# Patient Record
Sex: Female | Born: 2000 | Race: White | Hispanic: No | Marital: Single | Smoking: Never smoker
Health system: Southern US, Community
[De-identification: ages and names within clinical notes are randomized; demographics above are authoritative.]

## PROBLEM LIST (undated history)

## (undated) DIAGNOSIS — F984 Stereotyped movement disorders: Secondary | ICD-10-CM

## (undated) DIAGNOSIS — F840 Autistic disorder: Secondary | ICD-10-CM

## (undated) DIAGNOSIS — R112 Nausea with vomiting, unspecified: Secondary | ICD-10-CM

## (undated) DIAGNOSIS — R4701 Aphasia: Secondary | ICD-10-CM

## (undated) DIAGNOSIS — Z9889 Other specified postprocedural states: Secondary | ICD-10-CM

## (undated) DIAGNOSIS — D649 Anemia, unspecified: Secondary | ICD-10-CM

## (undated) DIAGNOSIS — N946 Dysmenorrhea, unspecified: Secondary | ICD-10-CM

## (undated) DIAGNOSIS — N920 Excessive and frequent menstruation with regular cycle: Secondary | ICD-10-CM

## (undated) DIAGNOSIS — F419 Anxiety disorder, unspecified: Secondary | ICD-10-CM

## (undated) HISTORY — DX: Dysmenorrhea, unspecified: N94.6

## (undated) HISTORY — DX: Anxiety disorder, unspecified: F41.9

## (undated) HISTORY — DX: Anemia, unspecified: D64.9

## (undated) HISTORY — PX: DENTAL RESTORATION/EXTRACTION WITH X-RAY: SHX5796

---
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
## 2018-03-04 ENCOUNTER — Ambulatory Visit (INDEPENDENT_AMBULATORY_CARE_PROVIDER_SITE_OTHER): Payer: Medicaid Other | Admitting: Pediatrics

## 2018-03-04 ENCOUNTER — Encounter (INDEPENDENT_AMBULATORY_CARE_PROVIDER_SITE_OTHER): Payer: Self-pay | Admitting: Pediatrics

## 2018-03-04 VITALS — BP 116/68 | HR 54 | Ht 66.0 in | Wt 161.2 lb

## 2018-03-04 DIAGNOSIS — F89 Unspecified disorder of psychological development: Secondary | ICD-10-CM | POA: Diagnosis not present

## 2018-03-04 DIAGNOSIS — F984 Stereotyped movement disorders: Secondary | ICD-10-CM | POA: Insufficient documentation

## 2018-03-04 DIAGNOSIS — F840 Autistic disorder: Secondary | ICD-10-CM | POA: Diagnosis not present

## 2018-03-04 DIAGNOSIS — G2569 Other tics of organic origin: Secondary | ICD-10-CM | POA: Insufficient documentation

## 2018-03-04 DIAGNOSIS — L981 Factitial dermatitis: Secondary | ICD-10-CM

## 2018-03-04 DIAGNOSIS — F424 Excoriation (skin-picking) disorder: Secondary | ICD-10-CM | POA: Insufficient documentation

## 2018-03-04 MED ORDER — CLONIDINE 0.1 MG/24HR TD PTWK: 0.1000 mg | MEDICATED_PATCH | TRANSDERMAL | 3 refills | Status: DC

## 2018-03-04 NOTE — Patient Instructions (Signed)
Start Clonidine 0.1mg  patch, change every week.  This is for tics and stereotypies.   Recommend trying SSRI again.  I wonder if Paxil would be a good medication given it's effectiveness on anxiety.  This is however Dr Gloris [place] specialty.   When she gets labs, would recommend ferritin level  Consider treating restless leg syndrome at next appointment.    Tic Disorders A tic disorder is a condition in which a person makes sudden and repeated movements or sounds (tics). There are three types of tic disorders:  Transient or provisional tic disorder (common). This type usually goes away within a year or two.  Chronic or persistent tic disorder. This type may last all through childhood and continue into the adult years.  Tourette syndrome (rare). This type lasts through all of life. It often occurs with other disorders. Tic disorders starts before age 78, usually between age of 43 and 51. These disorders cannot be cured, but there are many treatments that can help manage tics. Most tic disorders get better over time. What are the causes? The cause of this condition is not known. What are the signs or symptoms? The main symptom of this condition is experiencing tics. There are four type of tics:  Simple motor tics. These are movements in one area of the body.  Complex motor tics. These are movements in large areas or in several areas of the body.  Simple vocal tics. These are single sounds.  Complex vocal tics. These are sounds that include several words or phrases. Tics range in severity and may be more severe when you are stressed or tired. Tics can change over time. Symptoms of simple motor tics  Blinking, squinting, or eyebrow raising.  Nose wrinkling.  Mouth twitching, grimacing, or making tongue movements.  Head nodding or twisting.  Shoulder shrugging.  Arm jerking.  Foot shaking. Symptoms of complex motor tics  Grooming behavior, such as combing one's  hair.  Smelling objects.  Jumping.  Imitating others' behavior.  Making rude or obscene gestures. Symptoms of simple vocal tics  Coughing.  Humming.  Throat clearing.  Grunting.  Yawning.  Sniffing.  Barking.  Snorting. Symptoms of complex vocal tics  Imitating what others say.  Saying words and sentences that may: ? Seem out of context. ? Be rude. How is this diagnosed? This condition is diagnosed based on:  Your symptoms.  Your medical history.  A physical exam.  An exam of your nervous system (neurological exam).  Tests. These may be done to rule out other conditions that cause symptoms like tics. Tests may include: ? Blood tests. ? Brain imaging tests. Your health care provider will ask you about:  The type of tics you have.  When the tics started and how often they happen.  How the tics affect your daily activities.  Other medical issues you may have.  Whether you take over-the-counter or prescription medicines.  Whether you use any drugs. You may be referred to a brain and nerve specialist (neurologist) or a mental health specialist for further evaluation. How is this treated? Treatment for this condition depends on how severe your tics are. If they are mild, you may not need treatment. If they are more severe, you may benefit from treatment. Some treatments include:  Cognitive behavioral therapy. This kind of therapy involves talking to a mental health professional. The therapist can help you to: ? Become more aware of your tics. ? Learn ways to control your tics. ? Know how  to disguise your tics.  Family therapy. This kind of therapy provides education and emotional support for your family members.  Medicine that helps to control tics.  Medicine that is injected into the body to relax muscles (botulinum toxin). This may be a treatment option if your tics are severe.  Electrical stimulation of the brain (deep brain stimulation). This  may be a treatment option if your tics are severe. Follow these instructions at home:  Take over-the-counter and prescription medicines only as told by your health care provider.  Check with your health care provider before using any new prescription or over-the-counter medicines.  Keep all follow-up visits as told by your health care provider. This is important. Contact a health care provider if:  You are not able to take your medicines as prescribed.  Your symptoms get worse.  Your symptoms are interfering with your ability to function normally at home, work, or school.  You have new or unusual symptoms like pain or weakness.  Your symptoms make you feel depressed or anxious. Summary  A tic disorder is a condition in which a person makes sudden and repeated movements or sounds.  Tic disorders start before age 19, usually between the age of 395 and 5210.  Many tic disorders are mild and do not need treatment.  These disorders cannot be cured, but there are many treatments that can help manage tics. This information is not intended to replace advice given to you by your health care provider. Make sure you discuss any questions you have with your health care provider. Document Released: 11/04/2012 Document Revised: 03/22/2016 Document Reviewed: 03/22/2016 Elsevier Interactive Patient Education  2019 [employer]

## 2018-03-04 NOTE — Progress Notes (Signed)
Patient: Gabrielle [name] MRN: [id number] Sex: female DOB: 08/08/2000  Provider: Lorenz CoasterStephanie Deniyah Dillavou, MD Location of Care: [facility] - Child Neurology  Note type: New patient consultation  History of Present Illness: Referral Source: Gabrielle AstersJennifer Summer, MD History from: patient and prior records Chief Complaint: Facial Tics, Autism, Unsteadiness  Gabrielle [name] is a 17 y.o. female with history of autism spectrum disorder who I am seeing by the request of * for consultation on concern of abnormal movements. Review of prior history shows patient was last seen by his PCP on  where   Patient presents today with abnormal facial movements, picking behaviors, and abnormal walking.   Gabrielle [name] has autism and is non-verbal. April of 2019 started making new facial expressions. Her mouth opens very wide, and her eyes also open very wide, mother describes as 'ghoulish'. Typically lasts a few seconds at a time, and happens multiple times every day. Psychiatrist did not feel was consistent with TD, PCP thought was 'nervous tic'.  Mother is concerned for possible seizure activity versus tardive dyskinesia. Of note, facial expression is the same with each episode. It is also associated with a 'glottic sound', somewhat like clearing her throat, at same time she is making the facial expression.  In addition to this, starting around the same time (06/2017), Gabrielle [name] also developed what appears to mother to be fear with walking in certain locations, initially just the school cafeteria, but now also the school staircase and some hallways at school. She will hesitate at the entry way, and then either clutch her caretaker or the wall as she enters these locations. This behavior never happens at home. She does not appear unsteady on her feet. It is intermittent at school, as many days she enters these locations with no problem. Of note, Gabrielle [name] is very scared of ice and mother is wondering if the shiny floors at school  may be part of the problem.  In addition, mother describes 'repetitive body focused behaviors' which eventually cause self injury, which is also intermittent. She picks and scratches at her skin, locations rotate. Currently lower abdomen and behind her knees are worst. Mother reports she does not sense pain much, and wonders if the behaviors are self-stimulatory. It is hard to distracts her from it when she is doing it, but it may also go away for a month. She is mostly concerned about infection risk from her open wounds.  Additionally, mother concerned about Gabrielle [name]'s appetite. She reports she is 'never satiated' and very much enjoys eating (which is also puzzling to her as to why she is having trouble entering the cafeteria). Gabrielle [name] is extremely active, always 'up and bouncy', and mother is wondering if her hyperactive state may be contributing to her large appetite.  She has occasional frustration and aggression, typically related to her menstrual cycle, and has previously had D&C performed and now has an IUD in place. She had first menstrual period in 2012.  Regarding her medications, she currently takes Trazodone 50 mg and Lamotrigine 50 mg BID, and also has IUD in place. Her psychiatrist recommend decreasing lamotrigine to half of her current dose, but mother has not yet done this. She has been on both oral medications for long-term, and each has been gradually up-titrated.  Previous medications include Requip (ropinirole) for RLS, which improved after increasing ferritin levels and decreasing her menses. Previously, she has also tried Celexa, Zoloft, Lexapro, clonidine, Focalin, Buspar, hydroxyline., Ativan (short and long-acting).  Regarding her past work-up, she  has had genetic testing with unclear genetic mutation present (mother with paperwork in her file). They have previously seen Dr. Sharene Skeans to rule out seizure activity at age 32. She had normal EEG at that time. She has not had known seizures to  this point. She previously spoke, had 50 words and short phrases at 15 months, but then started to lose language by age 69 months, and is now non-verbal. She was diagnosed with ASD at age 51 months. She lost receptive language as well, but upon entering therapies did regain this by age 72.  She attends school at [employer] which is going well.   Diagnostics:   Review of Systems: A complete review of systems was remarkable for anxiety (face picking), occasional bed wetting, tics, all other systems reviewed and negative.  Past Medical History Past Medical History:  Diagnosis Date  . Anemia   . Anxiety   . Autism disorder    per mother severe autism, very social,  good receptive languange but nonverbal (can do some sign languange) , high tolerence to pain  . Dysmenorrhea   . Menorrhagia   . Nonverbal    PT AUTISTIC  . PONV (postoperative nausea and vomiting)    per mother "when wakes up pt can go from 0 to 60 and starts taking off cords and try to sit up to stand, better when mother and father are they when she wakes up"  . Repetitive movement    due to autism---  chronic picking of own skin and body    Surgical History Past Surgical History:  Procedure Laterality Date  . DENTAL RESTORATION/EXTRACTION WITH X-RAY     multiple times.  most recent 09/04/18  . DILATION AND CURETTAGE OF UTERUS N/A 04/07/2017   Procedure: DILATATION AND CURETTAGE;  Surgeon: Jerene Bears, MD;  Location: [facility];  Service: Gynecology;  Laterality: N/A;  . INTRAUTERINE DEVICE (IUD) INSERTION N/A 04/07/2017   Procedure: PLACEMENT OF Rutha Bouchard  IUD;  Surgeon: Jerene Bears, MD;  Location: [facility];  Service: Gynecology;  Laterality: N/A;  . MASS EXCISION Left 06/15/2015   Procedure: EXCISION OF LEFT AXILLARY MASS/TETANUS INJECTION;  Surgeon: Peggye Form, DO;  Location: [facility];  Service: Plastics;  Laterality: Left;    Family History family history  includes Arthritis in her paternal grandmother; Asthma in her mother; Cancer in her maternal grandfather; Diabetes in her maternal grandfather; Diverticulosis in her father; Endometriosis in her maternal grandmother; Heart disease in her maternal grandmother; Hyperlipidemia in her father and mother; Hypertension in her father and paternal grandfather.   Social History Social History   Social History Narrative   Patient lives with both parents. Jennessy attends [employer].       Therapies: Not at the moment; Speech consultation at school.    Allergies Allergies  Allergen Reactions  . Cleaner [Ao-Sept Disinfection-Neutral] Hives    BRAND NAME - KABOOM    Medications Current Outpatient Medications on File Prior to Visit  Medication Sig Dispense Refill  . ibuprofen (ADVIL,MOTRIN) 200 MG tablet Take 200 mg by mouth every 6 (six) hours as needed.    . lamoTRIgine (LAMICTAL) 100 MG tablet Take 50 mg by mouth 2 (two) times daily.    . traZODone (DESYREL) 50 MG tablet Take 50 mg by mouth at bedtime.     Marland Kitchen KYLEENA 19.5 MG IUD 1 Device by Intrauterine route once for 1 dose. 1 Intra Uterine Device 0  No current facility-administered medications on file prior to visit.    The medication list was reviewed and reconciled. All changes or newly prescribed medications were explained.  A complete medication list was provided to the patient/caregiver.  Physical Exam BP 116/68   Pulse (!) 54   Ht 5\' 6"  (1.676 m)   Wt 161 lb 3.2 oz (73.1 kg)   BMI 26.02 kg/m  88 %ile (Z= 1.19) based on CDC (Girls, 2-20 Years) weight-for-age data using vitals from 03/04/2018.  No exam data present  General: Awake, alert, nearly constantly pacing around the room, non-verbal. Multiple repetitive behaviors visualized, including facial expression of mother's concern (mouth and eyes opening wide). HEENT: PERRL, no conjunctivitis or rhinorrhea, MMM CV: RRR, no murmurs Resp: CTAB, normal WOB Abd: Soft,  non-tender, non-distended Neuro: Alert, non-verbal. Tracks objects with eyes well, vision and hearing grossly intact. Uses both hands equally. Gait steady and normal. Normal muscle bulk and tone. Skin: Multiple excoriations and cuts on lower abdomen (consistent with picking/scratching behavior). Scars present on chin from prior behavior. MSK: No edema or erythema  Screenings:   Diagnosis:  Problem List Items Addressed This Visit      Nervous and Auditory   Organic tic disorder     Other   Developmental disability   Autism - Primary   Stereotyped movement disorder   Compulsive skin picking      Assessment and Plan IRMGARD RAMPERSAUD is a 17 y.o. female with history of ASD who presents for evaluation of facial movements, fearful walking behavior, and picking at her skin.  Regarding her facial movements, they do not appear consistent with tardive dyskinesia, and although she has been on multiple medications, no anti-psychotics or medications considered high risk for TD. It is more likely that they represent a tic disorder, or given the frequency, even a stereotopy. In general, it is not necessary to treat a tic disorder or stereotopy, unless it is affecting quality of life. It is unclear if she is bothered by her behaviors and activity, although it is certainly possible. Her wounds from her picking behavior are at risk for infection. She also has an insatiable appetite which may be due in part to her constant and excessive movements. Therefore, after conversation with mother, decided to initiate treatment today. First line therapy includes alpha agonists (clonidine, guanfacine). Recommend starting clonidine patch, as will provide even level of medication and be relatively easy to administer and tolerable for [place]. Of note, Ryeleigh's psychiatrist would also like to reduce her lamotrigine dose, but mother would prefer to wait to do until after starting clonidine given her desire to make only a single  change at a time.  In addition, we would also consider adding an SSRI, in particular Paxil (paroxetine), to target anxiety for picking behaviors and other repetitive tic movements. Suspect fearful walking behavior may also improve after this treatment. However, would defer this treatment decision to her psychiatrist, and have discussed this with her mother.  Regarding lab work, would recommended ferritin level at next lab draw. She has had improvement with her Linna Hoff previously after treatment with iron and control of her menstrual period, and so therefore likely that RLS contributes to her excessive movements. RLS may also contribute to daytime restlessness, and if this was identified as part of Maridel's near-constant motion, would have additional therapeutic options potentially available.  Return in about 2 months (around 05/05/2018).  Gabrielle Coaster MD MPH Neurology and Neurodevelopment [facility] Child Neurology  [address]  [address], [place], [place] [address] Phone: [phone]

## 2018-03-20 ENCOUNTER — Telehealth (INDEPENDENT_AMBULATORY_CARE_PROVIDER_SITE_OTHER): Payer: Self-pay | Admitting: Pediatrics

## 2018-03-20 NOTE — Telephone Encounter (Signed)
Returned mother's phone call.  She has been having trouble keeping clonidine patch on.  Has also been more agitated, crying throughout the day.  Went to bed early, woke up early this morning and hasn't gone back to bed.  Mom has been out of the house today. Reviewed that clonidine seemed to make her more agitated at end of the pill. Also chews her pills. No changed from Dr Jannifer Franklin. Due to see him 03/24/2018.   Mother also now has a list of everything she's ever taken.  I asked her to please fax this to me.    I advised that emotionality can be a side effect, would encourage her to continue on the medication. Discussed that she can't do extended release, short acting would be three times daily.  Mother agreeable to continuing to try. Also try putting new patch on overnight.    Lorenz Coaster MD MPH

## 2018-03-20 NOTE — Telephone Encounter (Signed)
°  Who's calling (name and relationship to patient) : Melven Sartorius - Mother  Best contact number: [phone]  Provider they see: Dr. Artis Flock  Reason for call: Mom called in with some questions and concerns regarding the Clonidine path Dr. Artis Flock has prescribed. The patch was first applied on 03/18/18, since then Gabrielle [name] has been very emotionally distressed such as more crying, picking, and more agitated than normal. Mom would like to speak with Dr. Artis Flock or medical assistant to decide if they should just remove the patch or wait it out for the symptoms to subside. Please advise.

## 2018-03-20 NOTE — Telephone Encounter (Signed)
Spoke with mom about her phone message. She stated that when she first got the prescription,she put the patch on her and the patient had it off within 6 hours. She stated that they took a break after the last patch failure. She stated that they did the second patch on the first in the middle of the night but the patient became agitated throughout the day. I informed her that it usually takes one to two weeks for the medication to get in the patient's system. She stated that it is difficult to put the patches on and once the patches come off, the patient will not let mom put it back on. She wants Dr. Artis Flock to be aware that the patient is really difficult when it comes to the patch but she is willing to keep trying. Pease advise

## 2018-05-04 ENCOUNTER — Encounter (INDEPENDENT_AMBULATORY_CARE_PROVIDER_SITE_OTHER): Payer: Self-pay | Admitting: Pediatrics

## 2018-05-04 ENCOUNTER — Ambulatory Visit (INDEPENDENT_AMBULATORY_CARE_PROVIDER_SITE_OTHER): Payer: Medicaid Other | Admitting: Pediatrics

## 2018-05-04 VITALS — BP 102/68 | Ht 65.75 in | Wt 163.0 lb

## 2018-05-04 DIAGNOSIS — L981 Factitial dermatitis: Secondary | ICD-10-CM

## 2018-05-04 DIAGNOSIS — F840 Autistic disorder: Secondary | ICD-10-CM

## 2018-05-04 DIAGNOSIS — G2569 Other tics of organic origin: Secondary | ICD-10-CM | POA: Diagnosis not present

## 2018-05-04 DIAGNOSIS — F984 Stereotyped movement disorders: Secondary | ICD-10-CM | POA: Diagnosis not present

## 2018-05-04 DIAGNOSIS — F424 Excoriation (skin-picking) disorder: Secondary | ICD-10-CM

## 2018-05-04 DIAGNOSIS — F89 Unspecified disorder of psychological development: Secondary | ICD-10-CM

## 2018-05-04 DIAGNOSIS — Z79899 Other long term (current) drug therapy: Secondary | ICD-10-CM

## 2018-05-04 MED ORDER — CLONIDINE HCL 0.1 MG PO TABS: 0.0500 mg | ORAL_TABLET | Freq: Three times a day (TID) | ORAL | 0 refills | Status: DC

## 2018-05-04 NOTE — Progress Notes (Signed)
Patient: Gabrielle [name] MRN: [id number] Sex: female DOB: 03/05/2001  Provider: Lorenz CoasterStephanie Shanterica Biehler, MD Location of Care: [facility]  Note type: Routine follow-up  History of Present Illness: Gabrielle [name] is a 18 y.o. female with history of autism who I am seeing for follow-up of tic disorder and akathesia.   Patient was last seen on 03/04/18 where I started clonidine patch and recommended ferritin level for RLS.  Since the last appointment, there was a phone call due to patch coming off.  Mother also had list of her prior medications.  There have been no labs or admissions since last visit.   Patient presents today with mother and nurse. Mother reports she got clonidine off, mother had hard time keeping it on her.  No negative effects while she was on it.    They have gone down to half strength on lamictal.  Have not seen mouth movement recently. She is still very hyperactive, jumps frequently, seems to not want to stay still.  Sleep is good with trazodone.     Mother provided previous medical evaluations today including list of all previous medications. She took requip at age 89yo with pediatric neuropsych, they feel it made her nauseous.  She did sleep well on it.  She was not as active at that time.  Celexa was also started at that time, mother feels it was too high.  She had a flu virus, was sleeping better.  Pediatrician weaned her off and she was able to sleep well.  She was also started on Focalin, "climbing the walls", very amped up.  Couldn't take slow release. She is able to swallow some pills, sometimes chews it.   Regarding iron level, last ferritin level in 2008 was 46, mother unsure if she's had one since. She did get IUD 1 year ago. Still having periods, normal to light periods.  She likes red meat, doesn't like leafy vegetables. No upcoming sedation planed.  HgB normal in May, scheduled for appointment next May.    Gen[facility]), genetic testing showed normal karyotype, 8p11 duplication.  Did not test parents.  Food and chemical sensitivity testing showed severe intolerance to honey and scallops. Mild reaction to aspartame and artificial colors. Otherwise no major concerns.  Toxicology normal.  Organic acids, amino acids testing reviewed and copies, these are not standard panels but none appear extreme.  Mother not particularly interested in further genetics testing.  Mother is interested in pharmacogenetic testing however given her multiple medications and discussion of several other medications.     Diagnostics: Prior medications:  Neurontin, oxcarbazepine, Lamotrigine, Celexa, Lexapro. Fluvoxamine, buspirone, focalin, zoloft, hydroxizine.  Diazepam, Clonazepam, Midazolam, Lorazepam, Requip.   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  Did not test parents.   Food and chemical sensitivity testing showed severe intolerance to honey and scallops. Mild reaction to aspartame and artificial colors. Otherwise no major concerns.   Toxicology normal.   Organic acids, amino acids testing reviewed and copies, these are not standard panels but none appear extreme.  Mother not particularly interested in further genetics testing.   Past Medical History Past Medical History:  Diagnosis Date  . Anemia   . Anxiety   . Autism disorder    per mother severe autism, very social,  good receptive languange but nonverbal (can do some sign languange) , high tolerence to pain  . Dysmenorrhea   . Menorrhagia   . Nonverbal  PT AUTISTIC  . PONV (postoperative nausea and vomiting)    per mother "when wakes up pt can go from 0 to 60 and starts taking off cords and try to sit up to stand, better when mother and father are they when she wakes up"  . Repetitive movement    due to autism---  chronic picking of own skin and body    Surgical History Past Surgical History:  Procedure Laterality Date  .  DENTAL RESTORATION/EXTRACTION WITH X-RAY     multiple times.  most recent 09/04/18  . DILATION AND CURETTAGE OF UTERUS N/A 04/07/2017   Procedure: DILATATION AND CURETTAGE;  Surgeon: Jerene Bears, MD;  Location: [facility]  Service: Gynecology;  Laterality: N/A;  . INTRAUTERINE DEVICE (IUD) INSERTION N/A 04/07/2017   Procedure: PLACEMENT OF Rutha Bouchard  IUD;  Surgeon: Jerene Bears, MD;  Location: [facility];  Service: Gynecology;  Laterality: N/A;  . MASS EXCISION Left 06/15/2015   Procedure: EXCISION OF LEFT AXILLARY MASS/TETANUS INJECTION;  Surgeon: Peggye Form, DO;  Location: [facility];  Service: Plastics;  Laterality: Left;    Family History family history includes Arthritis in her paternal grandmother; Asthma in her mother; Cancer in her maternal grandfather; Diabetes in her maternal grandfather; Diverticulosis in her father; Endometriosis in her maternal grandmother; Heart disease in her maternal grandmother; Hyperlipidemia in her father and mother; Hypertension in her father and paternal grandfather.   Social History Social History   Social History Narrative   Patient lives with both parents. Marlenne attends [employer].       Therapies: Not at the moment; Speech consultation at school.    Allergies Allergies  Allergen Reactions  . Cleaner [Ao-Sept Disinfection-Neutral] Hives    BRAND NAME - KABOOM    Medications Current Outpatient Medications on File Prior to Visit  Medication Sig Dispense Refill  . ibuprofen (ADVIL,MOTRIN) 200 MG tablet Take 200 mg by mouth every 6 (six) hours as needed.    . lamoTRIgine (LAMICTAL) 100 MG tablet Take 25 mg by mouth 2 (two) times daily.     . traZODone (DESYREL) 50 MG tablet Take 50 mg by mouth at bedtime.     Marland Kitchen KYLEENA 19.5 MG IUD 1 Device by Intrauterine route once for 1 dose. 1 Intra Uterine Device 0   No current facility-administered medications on file prior to  visit.    The medication list was reviewed and reconciled. All changes or newly prescribed medications were explained.  A complete medication list was provided to the patient/caregiver.  Physical Exam BP 102/68   Ht 5' 5.75" (1.67 m)   Wt 163 lb (73.9 kg)   BMI 26.51 kg/m  89 %ile (Z= 1.22) based on CDC (Girls, 2-20 Years) weight-for-age data using vitals from 05/04/2018.  No exam data present General: NAD, well nourished neuroaffected female.  Makes eye contact, interactive but nonverbal.  Yelling and juping throughout evaluation.  HEENT: normocephalic, no eye or nose discharge.  MMM  Cardiovascular: warm and well perfused Lungs: Normal work of breathing, no rhonchi or stridor Skin: No birthmarks. Scratch marks of various ages on cuticles, trunk.   Abdomen: soft, non tender, non distended Extremities: No contractures or edema. Neuro: EOM intact, face symmetric. Moves all extremities equally and at least antigravity. No abnormal movements. Normal gait.  Jumps on both feet.   Diagnosis:  Problem List Items Addressed This Visit      Nervous and Auditory   Organic  tic disorder - Primary     Other   Developmental disability   Autism   Stereotyped movement disorder   Compulsive skin picking   Polypharmacy   Relevant Orders   [facility] genetic testing (Lineagen)      Assessment and Plan Gabrielle [name] is a 18 y.o. female with history of autism who I am seeing in follow-up of complex tic and akathesia.  We attempted clonidine given the tics and hyperactivity, but unable to keep patch on.  Patient unable to swallow pills consistently, so unable to take extended release.  Discussed instead doing TID dosing, mother in agreement.  For presumed RLS, shared decision making about sedation for ferritin testing vs prescription (requip) vs presumptive iron supplementation.  Will try iron supplement and see how she does for now.  Ok to start with clonidine as stores will need to build up over the next  3 months.  Mother concerned due to frequent medication changes, would like pharmacogenetic testing.  Given her extensive medication list and nonverbal status to report side effects, I agree with this plan.     Paperwork reviewed, copied and scanned into chart  Start Clonidine 0.05mg  TID.  Mother to call within 3-4 days to increase.   Medication administration form completed.   Lineagen buccal swab sent today for pharmacogenetic testing.  Advised of expected price but asked mother to please call company directly before processing. Contact information provided.   Start Vitron C, 2 tablets every other day.  Information given for vitamin.   I spent a total of 70 minutes in consultation with the patient and family, including review of prior documentation and discussion of lineagen testing.  Greater than 50% was spent in counseling and coordination of care with the patient.    Return in about 6 weeks (around 06/15/2018).  Lorenz Coaster MD MPH Neurology and Neurodevelopment [facility]  [address] [place], [place], [place] [address] Phone: [phone]

## 2018-05-04 NOTE — Patient Instructions (Addendum)
Lineagen testing completed today- results in 4-6 weeks Start Vitron C, 2 tablets every other day Start Clonidine 1/2 tablet three times daily School medication administration form completed for clonidine Recommend ferritin level at next lab draw.  Last lab draw 2008 low.   I don't mind weaning off Lamicatl- discuss with Dr Jannifer Franklin Consider Requip, paxil as next medications.

## 2018-05-15 ENCOUNTER — Telehealth (INDEPENDENT_AMBULATORY_CARE_PROVIDER_SITE_OTHER): Payer: Self-pay | Admitting: Pediatrics

## 2018-05-15 NOTE — Telephone Encounter (Signed)
I returned calling it went straight to voicemail. I left a message that I was returning the call regarding Kailey and that I would also send in [place] message.  Lorenz Coaster MD MPH

## 2018-05-15 NOTE — Telephone Encounter (Signed)
°  Who's calling (name and relationship to patient) : [name] (mom)  Best contact number: [phone]  Provider they see: Artis Flock   Reason for call: Mom LVM stating she would like for Dr Sheppard Penton to call her, she has updates about patient condition.   Please call.     PRESCRIPTION REFILL ONLY  Name of prescription:  Pharmacy:

## 2018-05-18 ENCOUNTER — Encounter (INDEPENDENT_AMBULATORY_CARE_PROVIDER_SITE_OTHER): Payer: Self-pay | Admitting: Pediatrics

## 2018-05-18 MED ORDER — CLONIDINE HCL 0.1 MG PO TABS: 0.1000 mg | ORAL_TABLET | Freq: Three times a day (TID) | ORAL | 0 refills | Status: DC

## 2018-05-18 NOTE — Telephone Encounter (Signed)
Mychart not active.  Called again this morning and mother answered.  Mother reports no side effects, she is possibly slower.   Recommend increasing Clonidine slowly to 0.1mg  TID. I will send a new medication administration form, will fax it to the school.    She also reports possible episodes vertigo, with holding onto the wall and clinging to staff, seeming unsteady acutely. I recommend meclizine if they think it is an inner ear concern.    Lorenz Coaster MD MPH

## 2018-06-20 ENCOUNTER — Other Ambulatory Visit (INDEPENDENT_AMBULATORY_CARE_PROVIDER_SITE_OTHER): Payer: Self-pay | Admitting: Pediatrics

## 2018-06-24 ENCOUNTER — Other Ambulatory Visit: Payer: Self-pay

## 2018-06-24 ENCOUNTER — Encounter (INDEPENDENT_AMBULATORY_CARE_PROVIDER_SITE_OTHER): Payer: Self-pay | Admitting: Pediatrics

## 2018-06-24 ENCOUNTER — Ambulatory Visit (INDEPENDENT_AMBULATORY_CARE_PROVIDER_SITE_OTHER): Payer: Medicaid Other | Admitting: Pediatrics

## 2018-06-24 DIAGNOSIS — F840 Autistic disorder: Secondary | ICD-10-CM

## 2018-06-24 DIAGNOSIS — F984 Stereotyped movement disorders: Secondary | ICD-10-CM | POA: Diagnosis not present

## 2018-06-24 DIAGNOSIS — G2569 Other tics of organic origin: Secondary | ICD-10-CM | POA: Diagnosis not present

## 2018-06-24 DIAGNOSIS — L981 Factitial dermatitis: Secondary | ICD-10-CM | POA: Diagnosis not present

## 2018-06-24 DIAGNOSIS — F89 Unspecified disorder of psychological development: Secondary | ICD-10-CM | POA: Diagnosis not present

## 2018-06-24 DIAGNOSIS — F424 Excoriation (skin-picking) disorder: Secondary | ICD-10-CM

## 2018-06-24 MED ORDER — CLONIDINE HCL 0.1 MG PO TABS: ORAL_TABLET | ORAL | 3 refills | Status: DC

## 2018-06-24 MED ORDER — TRAZODONE HCL 50 MG PO TABS: 50.0000 mg | ORAL_TABLET | Freq: Every day | ORAL | 3 refills | Status: DC

## 2018-06-24 NOTE — Patient Instructions (Signed)
Continue Lamictal 12.5mg  for another week, then stop Increase Clonidine to 0.1mg  in the morning, 0.15mg  at lunchtime and 0.15mg  at 5pm.  Continue trazodone 50mg  at night Follow-up in 1 month via webex to review medication changes.

## 2018-06-24 NOTE — Progress Notes (Signed)
Patient: Gabrielle [name] MRN: [id number] Sex: female DOB: 03/13/2001  Provider: Lorenz Coaster, MD  This is a Pediatric Specialist E-Visit follow up consult provided via WebEx  Gabrielle [name] and their parent/guardian Gabrielle [name] (name of consenting adult) consented to an E-Visit consult today.  Location of patient: Gabrielle [name] is at Home (location) Location of provider: Shaune Pascal is at [employer] (location) Patient was referred by Ronney Asters, MD   The following participants were involved in this E-Visit:mother, patient, docotr, medical assistant (list of participants and their roles)  Chief Complain/ Reason for E-Visit today: Routine Follow-Up  History of Present Illness:  Gabrielle [name] is a 18 y.o. female with history of autism, picking behaviors and akathesia who I am seeing for routine follow-up. Patient was last seen on 05/04/18 where I recommended clonidine.   Patient presents today with mother via webex. Mother has increased slowly to TID dosing has gotten to goal dose a couple weeks ago.    Mother hasn't noticed any major side effects.  She did take a nap once for 10 minutes, but not lethargic and having any trouble with sleeping overnight and no sleeping in.  She is now falling asleep and staying asleep through the night.  She is sitting down more, not as fidgity.  She is still doing some picking, it was especially bad at the end of the school year.    She continues on Lamictal, mother spoke with Dr Jannifer Franklin who said he would defer to me. Mother has been weaning, now on 12.5mg  BID for 1 week.    Diagnostics: Prior medications:  Neurontin, oxcarbazepine, Lamotrigine, Celexa, Lexapro. Fluvoxamine, buspirone, focalin, zoloft, hydroxizine.  Diazepam, Clonazepam, Midazolam, Lorazepam, Requip.   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  Did not test parents.   Food and chemical sensitivity testing showed severe intolerance to honey and scallops. Mild reaction  to aspartame and artificial colors. Otherwise no major concerns.   Toxicology normal.   Organic acids, amino acids testing reviewed and copies, these are not standard panels but none appear extreme.  Mother not particularly interested in further genetics testing.   Past Medical History Past Medical History:  Diagnosis Date  . Anemia   . Anxiety   . Autism disorder    per mother severe autism, very social,  good receptive languange but nonverbal (can do some sign languange) , high tolerence to pain  . Dysmenorrhea   . Menorrhagia   . Nonverbal    PT AUTISTIC  . PONV (postoperative nausea and vomiting)    per mother "when wakes up pt can go from 0 to 60 and starts taking off cords and try to sit up to stand, better when mother and father are they when she wakes up"  . Repetitive movement    due to autism---  chronic picking of own skin and body    Surgical History Past Surgical History:  Procedure Laterality Date  . DENTAL RESTORATION/EXTRACTION WITH X-RAY     multiple times.  most recent 09/04/18  . DILATION AND CURETTAGE OF UTERUS N/A 04/07/2017   Procedure: DILATATION AND CURETTAGE;  Surgeon: Jerene Bears, MD;  [facility];  Service: Gynecology;  Laterality: N/A;  . INTRAUTERINE DEVICE (IUD) INSERTION N/A 04/07/2017   Procedure: PLACEMENT OF Rutha Bouchard  IUD;  Surgeon: Jerene Bears, MD;  [facility];  Service: Gynecology;  Laterality: N/A;  . MASS EXCISION Left 06/15/2015   Procedure: EXCISION OF LEFT AXILLARY MASS/TETANUS  INJECTION;  Surgeon: Peggye Formlaire S Dillingham, DO;  Location: [facility];  Service: Plastics;  Laterality: Left;    Family History family history includes Arthritis in her paternal grandmother; Asthma in her mother; Cancer in her maternal grandfather; Diabetes in her maternal grandfather; Diverticulosis in her father; Endometriosis in her maternal grandmother; Heart disease in her maternal grandmother;  Hyperlipidemia in her father and mother; Hypertension in her father and paternal grandfather.   Social History Social History   Social History Narrative   Patient lives with both parents. Alan RipperClaire attends [employer].       Therapies: Not at the moment; Speech consultation at school.    Allergies Allergies  Allergen Reactions  . Cleaner [Ao-Sept Disinfection-Neutral] Hives    BRAND NAME - KABOOM    Medications Current Outpatient Medications on File Prior to Visit  Medication Sig Dispense Refill  . ibuprofen (ADVIL,MOTRIN) 200 MG tablet Take 200 mg by mouth every 6 (six) hours as needed.    . lamoTRIgine (LAMICTAL) 100 MG tablet Take 25 mg by mouth 2 (two) times daily.     Marland Kitchen. KYLEENA 19.5 MG IUD 1 Device by Intrauterine route once for 1 dose. 1 Intra Uterine Device 0   No current facility-administered medications on file prior to visit.    The medication list was reviewed and reconciled. All changes or newly prescribed medications were explained.  A complete medication list was provided to the patient/caregiver.  Physical Exam There were no vitals taken for this visit. No weight on file for this encounter.  No exam data present Gen: well appearing teen Skin: Reported raw skin from picking, unable to assess at distance HEENT: Normocephalic, no dysmorphic features, no conjunctival injection, nares patent, mucous membranes moist, oropharynx clear. Resp: normal work of breathing XB:[id number]:appears well perfused Abd: non-distended.  Ext: No deformities, no muscle wasting, ROM full.  Neurological Examination: MS: Awake, alert, still very active.  Cranial Nerves: EOM normal, no nystagmus; no ptsosis, face symmetric with full strength of facial muscles, hearing grossly intact.  Motor- At least antigravity in all muscle groups. No abnormal movements Reflexes- unable to test Sensation: unable to test sensation. Coordination: Jumping, running without balance problems.  Gait:  Normal gait.    Diagnosis:Organic tic disorder  Compulsive skin picking  Stereotyped movement disorder  Developmental disability  Autism   Assessment and Plan Gabrielle MaxwellClaire A Caver is a 18 y.o. female with history of autism and akathesia who I am seeing in follow-up. Patient overall doing better on clonidine and trazodone but continues to be very active.  Again reviewed long list of medications she has tried, will likely need to reattempt medications but for now will focus on these.  I reviewed that I do not have much experience with Lamictal for behavior, but at current doses would not expect it to be doing much,  Reviewed timing to safely wean.     Continue Lamictal 12.5mg  for another week, then stop  Increase Clonidine to 0.1mg  in the morning, 0.15mg  at lunchtime and 0.15mg  at 5pm.   Continue trazodone 50mg  at night - consider increasing further, BID dosing.   Follow-up in 1 month via webex to review medication changes.    Return in about 4 weeks (around 07/22/2018).  Lorenz CoasterStephanie Woodie Degraffenreid MD MPH Neurology and Neurodevelopment [facility]  [address], [place], [place] [address] Phone: [phone]   Total time on call: 26 minutes

## 2018-07-29 ENCOUNTER — Ambulatory Visit (INDEPENDENT_AMBULATORY_CARE_PROVIDER_SITE_OTHER): Payer: Medicaid Other | Admitting: Pediatrics

## 2018-08-03 ENCOUNTER — Ambulatory Visit (INDEPENDENT_AMBULATORY_CARE_PROVIDER_SITE_OTHER): Payer: Medicaid Other | Admitting: Pediatrics

## 2018-08-26 ENCOUNTER — Ambulatory Visit (INDEPENDENT_AMBULATORY_CARE_PROVIDER_SITE_OTHER): Payer: Medicaid Other | Admitting: Pediatrics

## 2018-09-16 ENCOUNTER — Other Ambulatory Visit: Payer: Self-pay

## 2018-09-16 ENCOUNTER — Ambulatory Visit (INDEPENDENT_AMBULATORY_CARE_PROVIDER_SITE_OTHER): Payer: Self-pay | Admitting: Dietician

## 2018-09-16 ENCOUNTER — Encounter (INDEPENDENT_AMBULATORY_CARE_PROVIDER_SITE_OTHER): Payer: Self-pay | Admitting: Pediatrics

## 2018-09-16 ENCOUNTER — Ambulatory Visit (INDEPENDENT_AMBULATORY_CARE_PROVIDER_SITE_OTHER): Payer: Medicaid Other | Admitting: Pediatrics

## 2018-09-16 DIAGNOSIS — L981 Factitial dermatitis: Secondary | ICD-10-CM

## 2018-09-16 DIAGNOSIS — F89 Unspecified disorder of psychological development: Secondary | ICD-10-CM

## 2018-09-16 DIAGNOSIS — F509 Eating disorder, unspecified: Secondary | ICD-10-CM

## 2018-09-16 DIAGNOSIS — F840 Autistic disorder: Secondary | ICD-10-CM

## 2018-09-16 DIAGNOSIS — G2569 Other tics of organic origin: Secondary | ICD-10-CM

## 2018-09-16 DIAGNOSIS — F424 Excoriation (skin-picking) disorder: Secondary | ICD-10-CM

## 2018-09-16 MED ORDER — TRAZODONE HCL 50 MG PO TABS: ORAL_TABLET | ORAL | 3 refills | Status: DC

## 2018-09-16 MED ORDER — CLONIDINE HCL 0.1 MG PO TABS
ORAL_TABLET | ORAL | 3 refills | Status: DC
Start: 2018-09-16 — End: 2019-01-25

## 2018-09-16 NOTE — Patient Instructions (Addendum)
Spread clonidine doses 8am, 2pm, 7:30pm Increase Trazodone to 75mg , can increase further to 100mg .  Delay Trazodone at 9:30pm, or as she falls asleep  Referral to dietician for compulsive eating

## 2018-09-16 NOTE — Progress Notes (Signed)
Patient: Gabrielle [name] MRN: [id number] Sex: female DOB: 08/15/2000  Provider: Lorenz CoasterStephanie Md Smola, MD  This is a Pediatric Specialist E-Visit follow up consult provided via WebEx.  Gabrielle [name] and their parent/guardian Melven SartoriusCherise Kye consented to an E-Visit consult today.  Location of patient: Gabrielle [name] is at home Location of provider: Shaune PascalStephanie Brilyn Tuller,MD is at office Patient was referred by Ronney AstersSummer, Jennifer, MD   The following participants were involved in this E-Visit: Lorre MunroeFabiola Cardenas, CMA      Lorenz CoasterStephanie Mycah Mcdougall, MD  Chief Complain/ Reason for E-Visit today: routine follow-up  History of Present Illness:  Gabrielle [name] is a 18 y.o. female with autism, developmental delay, tic diorder with compulsive picking, and akathesia who I am seeing for routine follow-up. Patient was last seen on 4/8/2-  Since the last appointment, mother has weaned Lamictal and increased clonidine.  She had no trouble with weaning, feels that she overall did better off medication. No facial tics or "glottic sound". They switched to 1.5 clonidine in the morning and lunch, and then 1 clonidine in afternoon.   Patient presents today with mother.  She reports improved picking, only happens now because she's tired.  Aggression is improved, gets irritable with periods.  IUD is still in. Still bouncing and rocking.  Mother thinks she is doing better out of school, that Gabrielle [name] was stressed and overstimulated by school.  She is sitting down some, but still in constant motion.   She is sleeping through the night, is tired by 8pm, then awake by 5-6am.  She is no longer waking up in the middle of the night. Takes a 10 minute nap around 5pm.    She is now taking shirt off, wanting to change clothes.  She has only soft clothes with no tags, elastic waist.  They handle it because if she wants her shirt off, she has to be in her room.    She still has some events of looking fearful and mother thinks she is having vertigo or balance  episodes.  She looks fearful and starts grabbing.    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 Mother has staff during the day to help with Gabrielle [name], 8am-5pm or 8am-8pm.  On weekends it's just family.    Awaiting bloodwork until she is sedated.  We are awaiting a ferritin level, but taking Vitron C. Started about 1 month ago.    Patient history:  Mother has guardianship  Patient has previously had severe menstruation.  Has had D&C for thick endometrium.  Now improved, has some breakthrough bleeding but no regular periods.   They have previously seen Dr. Sharene SkeansHickling to rule out seizure activity at age 89. She had normal EEG at that time. She has not had known seizures to this point. She previously spoke, had 50 words and short phrases at 15 months, but then started to lose language by age 20 months, and is now non-verbal. She was diagnosed with ASD at age 20 months. She lost receptive language as well, but upon entering therapies did regain this by age 89.She attends school at [employer] which is going well.    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 Mother has staff during the day to help with Gabrielle [name], 8am-5pm or 8am-8pm.  On weekends it's just family.  Prior medications:  Neurontin, oxcarbazepine, Lamotrigine, Celexa, Lexapro. Fluvoxamine, buspirone, focalin, zoloft, hydroxizine. Diazepam, Clonazepam, Midazolam, Lorazepam, Requip.   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 Did not test parents.  Food and chemical sensitivity testing showed severe intolerance to  honey and scallops. Mild reaction to aspartame and artificial colors. Otherwise no major concerns.  Toxicology normal.  Organic acids, amino acids testing reviewed and copies, these are not standard panels but none appear extreme. Mother not particularly interested in further genetics testing.  Past Medical History Past Medical History:  Diagnosis Date  . Anemia   . Anxiety   . Autism disorder    per mother severe autism, very social,  good  receptive languange but nonverbal (can do some sign languange) , high tolerence to pain  . Dysmenorrhea   . Menorrhagia   . Nonverbal    PT AUTISTIC  . PONV (postoperative nausea and vomiting)    per mother "when wakes up pt can go from 0 to 60 and starts taking off cords and try to sit up to stand, better when mother and father are they when she wakes up"  . Repetitive movement    due to autism---  chronic picking of own skin and body    Surgical History Past Surgical History:  Procedure Laterality Date  . DENTAL RESTORATION/EXTRACTION WITH X-RAY     multiple times.  most recent 09/04/18  . DILATION AND CURETTAGE OF UTERUS N/A 04/07/2017   Procedure: DILATATION AND CURETTAGE;  Surgeon: Megan Salon, MD;  [facility];  Service: Gynecology;  Laterality: N/A;  . INTRAUTERINE DEVICE (IUD) INSERTION N/A 04/07/2017   Procedure: PLACEMENT OF Verdia Kuba  IUD;  Surgeon: Megan Salon, MD;  Location: [facility];  Service: Gynecology;  Laterality: N/A;  . MASS EXCISION Left 06/15/2015   Procedure: EXCISION OF LEFT AXILLARY MASS/TETANUS INJECTION;  Surgeon: Wallace Going, DO;  Location: [facility];  Service: Plastics;  Laterality: Left;    Family History family history includes Arthritis in her paternal grandmother; Asthma in her mother; Cancer in her maternal grandfather; Diabetes in her maternal grandfather; Diverticulosis in her father; Endometriosis in her maternal grandmother; Heart disease in her maternal grandmother; Hyperlipidemia in her father and mother; Hypertension in her father and paternal grandfather.   Social History Social History   Social History Narrative   Patient lives with both parents. Gabrielle [name] attends [employer].       Therapies: Not at the moment; Speech consultation at school.    Allergies Allergies  Allergen Reactions  . Cleaner [Ao-Sept Disinfection-Neutral] Hives    BRAND NAME - KABOOM     Medications Current Outpatient Medications on File Prior to Visit  Medication Sig Dispense Refill  . Ferrous Sulfate (IRON) 325 (65 Fe) MG TABS Take 65 mg by mouth. Vitron-C    . ibuprofen (ADVIL,MOTRIN) 200 MG tablet Take 200 mg by mouth every 6 (six) hours as needed.    Marland Kitchen KYLEENA 19.5 MG IUD 1 Device by Intrauterine route once for 1 dose. 1 Intra Uterine Device 0   No current facility-administered medications on file prior to visit.    The medication list was reviewed and reconciled. All changes or newly prescribed medications were explained.  A complete medication list was provided to the patient/caregiver.  Physical Exam Vitals deferred due to webex visit Gen: well appearing neuroafected woman Skin: No rash, No neurocutaneous stigmata. HEENT: Normocephalic, no dysmorphic features, no conjunctival injection, nares patent, mucous membranes moist, oropharynx clear. Resp: normal work of breathing ZO:XWRUEAV well perfused Abd: non-distended.  Ext: No deformities, no muscle wasting, ROM full.  Neurological Examination: MS: Awake, alert, interactive. Does not attend to video, does not follow commands.  Cranial  Nerves: Unable to assess EOM. No nystagmus; no ptsosis, face symmetric with full strength of facial muscles, hearing grossly intact. Motor- At least antigravity in all muscle groups. No abnormal movements.  Reflexes- unable to test Sensation: unable to test sensation. Coordination: No dysmetria with reaching for objets.  Gait: Normal gait, near constant walking.  Does sit during some of the visit, improved from prior.   Diagnosis:  1. Compulsive eating patterns   2. Organic tic disorder   3. Compulsive skin picking   4. Autism   5. Developmental disability       Assessment and Plan Gabrielle MaxwellClaire A Carnero is a 18 y.o. female with history of autism, developmental delay, tic diorder with compulsive picking, and akathesia who I am seeing in follow-up. Patient's symptoms overall  improved, but still with picking and akathesia behaviors. Still having some insomnia with early morning wakening., then tired in early evening. Discussed tweeking medications with mother today to maximize effect and limit side effects.        Spread clonidine doses 8am, 2pm, 7:30pm  Increase Trazodone to 75mg , can increase further to 100mg .   Delay Trazodone at 9:30pm, or as she falls asleep   Referral to dietician for compulsive eating  Consider referral to genetics for Prader Willi syndrome given eating behaviors.  Prior work-up above, but no evidence of methylation study.    Return in about 4 weeks (around 10/14/2018).  Lorenz CoasterStephanie Kirstin Kugler MD MPH Neurology and Neurodevelopment [facility]  [address], [place], [place] [address] Phone: [phone]   Total time on call: 30 minutes

## 2018-09-16 NOTE — Progress Notes (Signed)
error 

## 2018-09-17 ENCOUNTER — Ambulatory Visit (INDEPENDENT_AMBULATORY_CARE_PROVIDER_SITE_OTHER): Payer: Medicaid Other | Admitting: Dietician

## 2018-09-17 NOTE — Progress Notes (Deleted)
   Medical Nutrition Therapy - Initial Assessment (Televisit) Appt start time: *** Appt end time: *** Reason for referral: compulsive eating patterns Referring provider: Dr. Rogers Blocker - Neuro Pertinent medical hx: developmental delay, autism, tic disorder, movement disorder  Assessment: Food allergies: *** Pertinent Medications: see medication list Vitamins/Supplements: *** Pertinent labs: none in Epic  (2/17) Anthropometrics per Epic: Ht: 167 cm  Wt: 74 kg  BMI: 26.5   Estimated minimum caloric needs: 2000 kcal/day (EER) Estimated minimum protein needs: 0.85 g/kg/day (DRI) Estimated minimum fluid needs: 2000 mL/day (1 mL/kcal)  Primary concerns today: Televisit due to [place] Webex. ***, legal guardian, on screen with pt, consenting to appt. Consult given pt with compulsive eating patterns causing weight gain.  Dietary Intake Hx: Usual eating pattern includes: *** meals and *** snacks per day. Location, family meals, electronics? Preferred foods: *** Avoided foods: *** Fast-food: *** 24-hr recall: Breakfast: *** Snack: *** Lunch: *** Snack: *** Dinner: *** Snack: *** Beverages: ***  Physical Activity: ***  GI: ***  Estimated intake likely exceeding needs given reported weight gain.  Nutrition Diagnosis: (7/2) ***  Intervention: *** Recommendations: - ***  Handouts Given: - ***  Teach back method used.  Monitoring/Evaluation: Goals to Monitor: - Weight trends  Follow-up in ***.  Total time spent in counseling: *** minutes.

## 2018-09-21 ENCOUNTER — Other Ambulatory Visit: Payer: Self-pay

## 2018-09-21 ENCOUNTER — Ambulatory Visit (INDEPENDENT_AMBULATORY_CARE_PROVIDER_SITE_OTHER): Payer: Medicaid Other | Admitting: Dietician

## 2018-09-21 DIAGNOSIS — R632 Polyphagia: Secondary | ICD-10-CM | POA: Diagnosis not present

## 2018-09-21 NOTE — Patient Instructions (Signed)
-   Set a meal schedule with a specific time that Derrick can keep up with. Limit food between these times. - Offer Rhema options for meals/snacks so she feels like she has some independence, but the caregiver is really in control. - Make sure all meals and snacks have a fat, protein, and carbohydrate. - When nagging for food, offer a distraction or a vegetable. - Limit over-consumed foods in the house. - Limit sugar sweetened beverages. - Have Shaunie help when preparing vegetables. - Smoothies: can't just be a fruit, has to have a protein (greek yogurt, milk, protein powder, peanut butter), has to have a vegetable (greens, cauliflower). - Considering obtaining a thyroid panel and hemoglobin A1c value.

## 2018-09-21 NOTE — Progress Notes (Signed)
Medical Nutrition Therapy - Initial Assessment (Televisit) Appt start time: 2:26 PM Appt end time: 3:20 PM Reason for referral: compulsive eating patterns Referring provider: Dr. Rogers Blocker - Neuro Pertinent medical hx: developmental delay, autism, tic disorder, movement disorder, non-verbal   Assessment: Food allergies: sensitivities to MSG, aspartame, caffeine Pertinent Medications: see medication list Vitamins/Supplements: vitron-C Pertinent labs: none in Epic  (2/17) Anthropometrics per Epic: Ht: 167 cm  Wt: 74 kg  BMI: 26.5   Estimated minimum caloric needs: 2000 kcal/day (EER) Estimated minimum protein needs: 0.85 g/kg/day (DRI) Estimated minimum fluid needs: 2000 mL/day (1 mL/kcal)  Primary concerns today: Televisit due to [place] Webex. Mom, legal guardian, on screen with pt, consenting to appt. Consult given pt with compulsive eating patterns causing weight gain. Per mom, pt has a large appetite and it seems like pt never gets full.  Dietary Intake Hx: Usual eating pattern includes: 3 meals and frequent snacks per day. Pt lives with mom, dad, and [employer] staff to help. Family meals usually, but pt prefers to eat alone. Pt loves carbs. Mom keeps snack cabinet locked (witnessed on screen) or hides foods. Mom reports pt does not have a texture preference. Family avoids artifical sweeteners. Pt will grab food from plates at other tables in restaurants. Pt frequently requesting double portions. Pt nags mom and dad until she gets what she wants, will sometimes nag staff. Preferred foods: carbs  Avoided foods: green vegetables (will eat green beans, okra, fried zucchini, sometimes salad prepared a specific way) Fast-food: breakfast 1x/week - Bojangles, [place], McDonalds --- lunch/dinner 5x/week - Chick-fil-a (8-12 nuggets, fries, lemonade), Bojangles (chicken supremes), Pizza (pepperoni, mushrooms), Wendy's (chicken sandwich, baked potato), Panera (mac-n-cheese bowl), Jimmy  John's (8 inch ham and cheese)  During school ([employer] eligible until age 78): free breakfast/lunch school - mom struggles with packing vs letting pt get lunch at school 24-hr recall: Breakfast: zucchini bread with milk OR waffles/pancakes OR cereal (raisin bran, cheerios) OR cheese grits OR fried eggs with sausage OR 2 sausage biscuits Lunch: fast food Snacks: graham crackers, crackers (Lances, triscuits, saltines - sometimes with PB), trail mix, raisins, cranberries, cereal, nilla wafers, kind bars, No Junk granola bar, nectarines, plums, apples, oatmeals Dinner: fast food OR protein (steak, pork, burger), starch (pasta, beans, sweet potatoes, bread), vegetable (squash, okra) - a lot of soup in summer Snack: CHO, protein Beverages: 40-48 oz water, 12 oz whole milk or 2% Lactaid, 1 box juicy juice OR 8 oz Gatorade  Physical Activity: "extremely active" bouncing, twirling, walking around, plays in sandbox, enjoys being outside, likes riding in car, interested in swimming - limited screen time - does not like screens, will mostly just listen to the music  GI: sometimes loose stools, but not a regular issues - "well-formed, fluffy"  Estimated intake likely exceeding needs given reported weight gain.  Nutrition Diagnosis: (7/6) Excessive energy intake related to suspected inability to understand hunger/fullness cues as evidence by parental report of eating habits  Intervention: Discussed current diet and eating patterns in detailed. Discussed recommendations below. Throughout appt, pt continuously came to the kitchen to pick at her afternoon snack. All questions answered, mom in agreement with plan. Discussed potential for Prader Willi syndrome with Dr. Rogers Blocker. Recommendations: - Set a meal schedule with a specific time that Kalenna can keep up with. Limit food between these times. - Offer Ni[place] options for meals/snacks so she feels like she has some independence, but the caregiver is really in  control. - Make sure all meals  and snacks have a fat, protein, and carbohydrate. - When nagging for food, offer a distraction or a vegetable. - Limit over-consumed foods in the house. - Limit sugar sweetened beverages. - Have Female help when preparing vegetables. - Smoothies: can't just be a fruit, has to have a protein (greek yogurt, milk, protein powder, peanut butter), has to have a vegetable (greens, cauliflower). - Considering obtaining a thyroid panel and hemoglobin A1c value.  Handouts Given: - AND Smart Snacking for Teens  Teach back method used.  Monitoring/Evaluation: Goals to Monitor: - Weight trends - Lab values - Prader Ollen Barges?  Follow-up in 1 month, joint with Rogers Blocker.  Total time spent in counseling: 54 minutes.

## 2018-10-23 ENCOUNTER — Ambulatory Visit (INDEPENDENT_AMBULATORY_CARE_PROVIDER_SITE_OTHER): Payer: Medicaid Other | Admitting: Dietician

## 2018-10-23 ENCOUNTER — Ambulatory Visit (INDEPENDENT_AMBULATORY_CARE_PROVIDER_SITE_OTHER): Payer: Medicaid Other | Admitting: Pediatrics

## 2018-10-23 ENCOUNTER — Other Ambulatory Visit: Payer: Self-pay

## 2018-10-23 ENCOUNTER — Encounter (INDEPENDENT_AMBULATORY_CARE_PROVIDER_SITE_OTHER): Payer: Self-pay | Admitting: Pediatrics

## 2018-10-23 DIAGNOSIS — F840 Autistic disorder: Secondary | ICD-10-CM

## 2018-10-23 DIAGNOSIS — F509 Eating disorder, unspecified: Secondary | ICD-10-CM

## 2018-10-23 DIAGNOSIS — G479 Sleep disorder, unspecified: Secondary | ICD-10-CM

## 2018-10-23 DIAGNOSIS — F984 Stereotyped movement disorders: Secondary | ICD-10-CM

## 2018-10-23 DIAGNOSIS — F89 Unspecified disorder of psychological development: Secondary | ICD-10-CM | POA: Diagnosis not present

## 2018-10-23 DIAGNOSIS — R632 Polyphagia: Secondary | ICD-10-CM | POA: Diagnosis not present

## 2018-10-23 DIAGNOSIS — G2569 Other tics of organic origin: Secondary | ICD-10-CM | POA: Diagnosis not present

## 2018-10-23 NOTE — Patient Instructions (Addendum)
   Consider labwork- TSH, A1C, Ferritin, Prader Willi syndrome.   I am going to work on [facility] testing and sedation without a procedure, and get back to you at our next visit.    Increase Trazodone back to 75mg  nightly and see how she does for 2 weeks.  If sleep is still poor, recommend increasing further to 100mg , but may need to change the routine in the morning to allow her to wake up.   Continue clonidine at current doses

## 2018-10-23 NOTE — Patient Instructions (Signed)
-   Have Syra help when preparing vegetables. - Continue offering non-preferred food and new foods on plates. - Try giving Gladyes some autonomy to serve herself. - Offer a fruit or vegetable as an afternoon snack if she didn't have a vegetable at lunch time.

## 2018-10-23 NOTE — Progress Notes (Signed)
Medical Nutrition Therapy - Progress Note (Televisit) Appt start time: 12:00 PM Appt end time: 12:21 PM Reason for referral: compulsive eating patterns Referring provider: Dr. Rogers Blocker - Neuro Pertinent medical hx: developmental delay, autism, tic disorder, movement disorder, non-verbal   Assessment: Food allergies: sensitivities to MSG, aspartame, caffeine Pertinent Medications: see medication list Vitamins/Supplements: vitron-C Pertinent labs: none in Epic  No recent anthros in Epic.  (2/17) Anthropometrics per Epic: Ht: 167 cm  Wt: 74 kg  BMI: 26.5   Estimated minimum caloric needs: 2000 kcal/day (EER) Estimated minimum protein needs: 0.85 g/kg/day (DRI) Estimated minimum fluid needs: 2000 mL/day (1 mL/kcal)  Primary concerns today: Televisit due to [place] Webex, joint with Dr. Rogers Blocker. Mom, legal guardian, on screen with pt, consenting to appt. Follow up for compulsive eating patterns causing weight gain.  Dietary Intake Hx: Usual eating pattern includes: 3 meals and 2-3 snacks per day. Pt lives with mom, dad, and [employer] staff to help. Family meals usually, but pt prefers to eat alone. Pt loves carbs. Mom keeps snack cabinet locked (witnessed on screen at initial appt) or hides foods. Mom reports pt does not have a texture preference. Family avoids artifical sweeteners. Pt will grab food from plates at other tables in restaurants. Family now following a strict meal schedule with "time tags." When pt asks for food, they will point at the time tag and the clock and say she can eat again when the clock says what the time tag does. Mom reports this has drastically improved pts nagging for food. Preferred foods: carbs  Avoided foods: green vegetables (will eat green beans, okra, fried zucchini, sometimes salad prepared a specific way) Fast-food: breakfast 1x/week - [employer], [employer], McDonalds --- lunch/dinner 5x/week - Chick-fil-a (8-12 nuggets, fries, lemonade),  [employer]jangles (chicken supremes), Pizza (pepperoni, mushrooms), Wendy's (chicken sandwich, baked potato), Panera (mac-n-cheese bowl), Jimmy John's (8 inch ham and cheese), [employer] (sweet potatoes with beef), [employer] (spicy chicken with rice) During school ([employer] eligible until age 40): free breakfast/lunch school - mom struggles with packing vs letting pt get lunch at school 24-hr recall: Breakfast 8 AM: toast with PB OR sausage breakfast OR cereal (cheerios with milk) Snack 10 AM: graham crackers with PB OR trail mix OR kind bars OR pretzel with PB OR pretzels with hummus OR fruit with string cheese OR carrots and ranch OR homemade energy bites Lunch 12 PM: quesadilla (cheese, beans, jalapeno) with fruit OR mac-n-cheese with hot dog OR fast food Snack 3 PM: above Snack 5 PM: above Dinner 6 PM: fast food OR protein (steak, pork, burger), starch (pasta, beans, sweet potatoes, bread), vegetable (squash, okra, salad) Beverages: 40-48 oz water, 12 oz whole milk or 2% Lactaid, 1 Caprisun Refreshers  Physical Activity: "extremely active" bouncing, twirling, walking around, plays in sandbox, enjoys being outside, likes riding in car, interested in swimming - limited screen time - does not like screens, will mostly just listen to the music  GI: sometimes loose stools, but not a regular issues - "well-formed, fluffy"  Estimated intake likely exceeding needs given reported weight gain.  Nutrition Diagnosis: (7/6) Excessive energy intake related to suspected inability to understand hunger/fullness cues as evidence by parental report of eating habits  Intervention: Discussed current diet and changes made in detail. Encouraged and affirmed mom on progress made. Mom reports focusing on meal schedule and better balanced snacks. Pt with a reduction in appetite and is no longer demanding second servings, sometimes she does not finish her first plate.  Suspect pt previously showing signs of insulin  resistance with constant hunger. Discussed recommendations below. All questions answered, mom in agreement with plan. Recommendations: - Have [place] help when preparing vegetables. - Continue offering non-preferred food and new foods on plates. - Try giving Anetra some autonomy to serve herself. - Offer a fruit or vegetable as an afternoon snack if she didn't have a vegetable at lunch time. - Prader Ollen Barges testing  Teach back method used.  Monitoring/Evaluation: Goals to Monitor: - Weight trends - Lab values - Prader Ollen Barges?  Follow-up in 2 months, joint with Rogers Blocker.  Total time spent in counseling: 21 minutes.

## 2018-10-23 NOTE — Progress Notes (Signed)
Patient: Gabrielle [name] MRN: [id number] Sex: female DOB: 07/19/2000  Provider: Lorenz CoasterStephanie Aaralynn Shepheard, MD  This is a Pediatric Specialist E-Visit follow up consult provided via WebEx.  Gabrielle [name] and their parent/guardian  Gabrielle SartoriusCherise [name] consented to an E-Visit consult today.  Location of patient: Gabrielle RipperClaire is at home Location of provider: Shaune PascalStephanie Taleeyah Bora,MD is at office Patient was referred by Ronney AstersSummer, Jennifer, MD   The following participants were involved in this E-Visit: Lorre MunroeFabiola Cardenas, CMA      Lorenz CoasterStephanie Deniesha Stenglein, MD  Chief Complain/ Reason for E-Visit today:  Routine Follow-up  History of Present Illness:  Gabrielle MaxwellClaire A [place] is a 18 y.o. female with history of autism, developmental delay, tic diorder with compulsive picking, and akathesia of unknown etiology who I am seeing for routine follow-up. Patient was last seen on 09/16/18 where we made minor adjustments to her medications to improve sleep.  Since the last appointment, she saw MoroccoKat with dietetics for overeating.   Patient presents today with mom.  Mother feels that Clonidine timing rearrangement worked well. Mother did increase trazodone to 75mg , didn't feel there was a difference.  Increased to 100mg  and saw a difference in a week.  She initially woke up at 7am, and not as exhausted later in the day, but felt she was more agitated when she woke up.  Mother didn't necessarily notice this, and feels that father is a little more reactive. She doesn't appear sluggish, but her sounds are more irritated. However, they dropped back to 50mg  nightly this week, and she started waking up early again.        They are now using time cards, trying to help her recognize what she feels.  Now giving higher quality snacks.  Now not asking for food as often and not as urgent.  Not always getting seconds at dinner.      Patient history:  Mother has guardianship  Patient has previously had severe menstruation.  Has had D&C for thick endometrium.  Now improved,  has some breakthrough bleeding but no regular periods.   They have previously seen Dr. Sharene SkeansHickling to rule out seizure activity at age 89. She had normal EEG at that time. She has not had known seizures to this point. She previously spoke, had 50 words and short phrases at 15 months, but then started to lose language by age 20 months, and is now non-verbal. She was diagnosedwith ASDat age 20 months. She lost receptive language as well, but upon entering therapies did regain this by age 89.She attends school [employer] which is going well.    Gabrielle RipperClaire has CAP-IDD. Mother has staff during the day to help with Gabrielle Ripperlaire, 8am-5pm or 8am-8pm.  On weekends it's just family. Prior medications:  Neurontin, oxcarbazepine, Lamotrigine, Celexa, Lexapro. Fluvoxamine, buspirone, focalin, zoloft, hydroxizine.  Diazepam, Clonazepam, Midazolam, Lorazepam, Requip.   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  Did not test parents.   Food and chemical sensitivity testing showed severe intolerance to honey and scallops. Mild reaction to aspartame and artificial colors. Otherwise no major concerns.   Toxicology normal.   Organic acids, amino acids testing reviewed and copies, these are not standard panels but none appear extreme.  Mother not particularly interested in further genetics testing.  Past Medical History Past Medical History:  Diagnosis Date  . Anemia   . Anxiety   . Autism disorder    per mother severe autism, very social,  good receptive languange but nonverbal (can do some sign  languange) , high tolerence to pain  . Dysmenorrhea   . Menorrhagia   . Nonverbal    PT AUTISTIC  . PONV (postoperative nausea and vomiting)    per mother "when wakes up pt can go from 0 to 60 and starts taking off cords and try to sit up to stand, better when mother and father are they when she wakes up"  . Repetitive movement    due to autism---  chronic picking of own skin and body     Surgical History Past Surgical History:  Procedure Laterality Date  . DENTAL RESTORATION/EXTRACTION WITH X-RAY     multiple times.  most recent 09/04/18  . DILATION AND CURETTAGE OF UTERUS N/A 04/07/2017   Procedure: DILATATION AND CURETTAGE;  Surgeon: Jerene BearsMiller, Mary S, MD;  [facility] [place];  Service: Gynecology;  Laterality: N/A;  . INTRAUTERINE DEVICE (IUD) INSERTION N/A 04/07/2017   Procedure: PLACEMENT OF Rutha BouchardKYLEENA  IUD;  Surgeon: Jerene BearsMiller, Mary S, MD;  [facility] [place];  Service: Gynecology;  Laterality: N/A;  . MASS EXCISION Left 06/15/2015   Procedure: EXCISION OF LEFT AXILLARY MASS/TETANUS INJECTION;  Surgeon: Peggye Formlaire S Dillingham, DO;  Location: [facility];  Service: Plastics;  Laterality: Left;    Family History family history includes Arthritis in her paternal grandmother; Asthma in her mother; Cancer in her maternal grandfather; Diabetes in her maternal grandfather; Diverticulosis in her father; Endometriosis in her maternal grandmother; Heart disease in her maternal grandmother; Hyperlipidemia in her father and mother; Hypertension in her father and paternal grandfather.   Social History Social History   Social History Narrative   Patient lives with both parents. Gabrielle RipperClaire attends [employer].       Therapies: Not at the moment; Speech consultation at school.    Allergies Allergies  Allergen Reactions  . Cleaner [Ao-Sept Disinfection-Neutral] Hives    BRAND NAME - KABOOM    Medications Current Outpatient Medications on File Prior to Visit  Medication Sig Dispense Refill  . cloNIDine (CATAPRES) 0.1 MG tablet Take 1.5 tablet in the morning, 1.5 tablets in the afternoon and 1 tablets in the evening. 120 tablet 3  . Ferrous Sulfate (IRON) 325 (65 Fe) MG TABS Take 65 mg by mouth. Vitron-C    . ibuprofen (ADVIL,MOTRIN) 200 MG tablet Take 200 mg by mouth every 6 (six) hours as needed.    . traZODone (DESYREL) 50 MG  tablet Increase to 75mg .  Increase further to 100mg  in 2 weeks. 60 tablet 3  . KYLEENA 19.5 MG IUD 1 Device by Intrauterine route once for 1 dose. 1 Intra Uterine Device 0   No current facility-administered medications on file prior to visit.    The medication list was reviewed and reconciled. All changes or newly prescribed medications were explained.  A complete medication list was provided to the patient/caregiver.  Physical Exam Vitals deferred due to webex visit Gen: well appearing neuroaffected woman Skin: No rash, No neurocutaneous stigmata. HEENT: Normocephalic, no dysmorphic features, no conjunctival injection, nares patent, mucous membranes moist, oropharynx clear. Resp: normal work of breathing UE:[id number]:appears well perfused Abd: non-distended.  Ext: No deformities, no muscle wasting, ROM full.  Neurological Examination: MS: Awake, alert, interactive. Responds to mother's presence, but does not communicate or follow commands. Patient more calm today.  Cranial Nerves: EOM normal, no nystagmus; no ptsosis, face symmetric with full strength of facial muscles, hearing grossly intact. Motor- At least antigravity in all muscle groups. No abnormal movements Reflexes-  unable to test Sensation: unable to test sensation. Coordination: No dysmetria on extension of arms bilaterally.  No difficulty with balance when standing on one foot bilaterally.   Gait: Normal gait.   Diagnosis:  1. Autism   2. Overeating   3. Organic tic disorder   4. Developmental disability   5. Stereotyped movement disorder   6. Sleeping difficulty       Assessment and Plan JESALYN FINAZZO is a 18 y.o. female with history of autism, developmental delay, tic diorder with compulsive picking, and akathesia of unknown etiologywho I am seeing in follow-up. Today, focused on evaluation, would like to get methylation testing for Manuela Neptune given continued difficulty with overeating. If we got bloodwork, would recommend  some other tests as well for health maintenance.  With sleep, discussed weighing medical vs behavioral approaches to sleep and deciding priorities for sleep with mother and father.  Discussed increasing more slowly this time to limit sedation.     Consider labwork- TSH, A1C, Ferritin, Prader Willi syndrome.   I am going todiscuss Manuela Neptune testing with Dr Shann Medal.  Consider sedation for labwork.  Will discuss with mother further at our next visit.    Increase Trazodone back to 75mg  nightly and see how she does for 2 weeks.  If sleep is still poor, recommend increasing further to 100mg , but may need to change the routine in the morning to allow her to wake up.   Patient to follow-up with Wendelyn Breslow regarding diet  Continue clonidine at current doses  Return in about 2 months (around 12/23/2018).  Carylon Perches MD MPH Neurology and [facility]  [place] [place], [place], [place] [address] Phone: [phone]   Total time on call: 30 minutes

## 2019-01-24 ENCOUNTER — Other Ambulatory Visit (INDEPENDENT_AMBULATORY_CARE_PROVIDER_SITE_OTHER): Payer: Self-pay | Admitting: Pediatrics

## 2019-01-25 ENCOUNTER — Encounter (INDEPENDENT_AMBULATORY_CARE_PROVIDER_SITE_OTHER): Payer: Self-pay | Admitting: Pediatrics

## 2019-01-25 ENCOUNTER — Ambulatory Visit (INDEPENDENT_AMBULATORY_CARE_PROVIDER_SITE_OTHER): Payer: Medicaid Other | Admitting: Pediatrics

## 2019-01-25 ENCOUNTER — Ambulatory Visit (INDEPENDENT_AMBULATORY_CARE_PROVIDER_SITE_OTHER): Payer: Medicaid Other | Admitting: Dietician

## 2019-01-25 ENCOUNTER — Other Ambulatory Visit: Payer: Self-pay

## 2019-01-25 VITALS — Ht 66.0 in | Wt 153.8 lb

## 2019-01-25 DIAGNOSIS — G2571 Drug induced akathisia: Secondary | ICD-10-CM | POA: Diagnosis not present

## 2019-01-25 DIAGNOSIS — R633 Feeding difficulties: Secondary | ICD-10-CM

## 2019-01-25 DIAGNOSIS — R451 Restlessness and agitation: Secondary | ICD-10-CM | POA: Diagnosis not present

## 2019-01-25 DIAGNOSIS — R635 Abnormal weight gain: Secondary | ICD-10-CM | POA: Diagnosis not present

## 2019-01-25 DIAGNOSIS — R6339 Other feeding difficulties: Secondary | ICD-10-CM

## 2019-01-25 MED ORDER — CLONIDINE HCL 0.1 MG PO TABS: ORAL_TABLET | ORAL | 3 refills | Status: DC

## 2019-01-25 MED ORDER — TRAZODONE HCL 50 MG PO TABS: 100.0000 mg | ORAL_TABLET | Freq: Every day | ORAL | 3 refills | Status: DC

## 2019-01-25 NOTE — Progress Notes (Signed)
Patient: Gabrielle [name] MRN: [id number] Sex: female DOB: 04/04/2000  Provider: Lorenz CoasterStephanie Paulett Kaufhold, MD Location of Care: [facility]  Note type: Routine return visit   This is a Pediatric Specialist E-Visit follow up consult provided via WebEx.  Gabrielle [name] and their parent/guardian  Gabrielle SartoriusCherise [name] consented to an E-Visit consult today.  Location of patient: Alan RipperClaire is at home Location of provider: Shaune PascalStephanie [name],MD is at home Patient was referred by Ronney AstersSummer, Jennifer, MD   The following participants were involved in this E-Visit: Lorre MunroeFabiola Cardenas, CMA      Lorenz CoasterStephanie Carmesha Morocco, MD  Chief Complain/ Reason for E-Visit today:  Routine Follow-up  History of Present Illness: Referral Source: Ronney AstersJennifer Summer MD History from: [facility], [facility] and mom Chief Complaint: Autism, problems with aggression, and hard time riding in cars, not wanting to stay dressed  Gabrielle [name] is a 18 y.o. female with history of autism, developmental delay, tic diorder with compulsive picking, and akathesia of unknown etiology who I am seeing in routine follow-up.  Patient last seen 10/23/18 where we continued to tweak medications to improve mood, sleep, tics, and akathesia.    Patient presents today with mother who reports they were first concerned at   Sleeps well. Usually sleeping 9pm-7am. Has an hour by herself before mom gets up.   More agitated in the car.  Will pick, push buttons, rock in car.  Picks behind the knees in the car.   At home, she is sometimes tearful, screetching. It happens   Appetite decreased, controlling what she eats and no longer interested in her previous favorites. Mother has gotten rid of snack cabinet.   It has been about 10 years since she last had bloodwork without sedation.  Now so big they are not able to manage her physically.  Sedations were through the dentist.  She has not seen Gyn in 2 yeats, has not seen Dentist during COVID.   Prior  medications:  Neurontin, oxcarbazepine, Lamotrigine, Celexa, Lexapro. Fluvoxamine, buspirone, focalin, zoloft, hydroxizine. Diazepam, Clonazepam, Midazolam, Lorazepam, Requip.   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 Did not test parents.  Food and chemical sensitivity testing showed severe intolerance to honey and scallops. Mild reaction to aspartame and artificial colors. Otherwise no major concerns.  Toxicology normal.  Organic acids, amino acids testing reviewed and copies, these are not standard panels but none appear extreme. Mother not particularly interested in further genetics testing.   Past Medical History Past Medical History:  Diagnosis Date  . Anemia   . Anxiety   . Autism disorder    per mother severe autism, very social,  good receptive languange but nonverbal (can do some sign languange) , high tolerence to pain  . Dysmenorrhea   . Menorrhagia   . Nonverbal    PT AUTISTIC  . PONV (postoperative nausea and vomiting)    per mother "when wakes up pt can go from 0 to 60 and starts taking off cords and try to sit up to stand, better when mother and father are they when she wakes up"  . Repetitive movement    due to autism---  chronic picking of own skin and body   Surgical History Past Surgical History:  Procedure Laterality Date  . DENTAL RESTORATION/EXTRACTION WITH X-RAY     multiple times.  most recent 09/04/18  . DILATION AND CURETTAGE OF UTERUS N/A 04/07/2017   Procedure: DILATATION AND CURETTAGE;  Surgeon: Jerene BearsMiller, Mary S, MD;  Location:  [facility];  Service: Gynecology;  Laterality: N/A;  . INTRAUTERINE DEVICE (IUD) INSERTION N/A 04/07/2017   Procedure: PLACEMENT OF Rutha Bouchard  IUD;  Surgeon: Jerene Bears, MD;  Location[facility];  Service: Gynecology;  Laterality: N/A;  . MASS EXCISION Left 06/15/2015   Procedure: EXCISION OF LEFT AXILLARY MASS/TETANUS INJECTION;  Surgeon: Peggye Form, DO;   Location: [facility];  Service: Plastics;  Laterality: Left;    Family History family history includes Arthritis in her paternal grandmother; Asthma in her mother; Cancer in her maternal grandfather; Diabetes in her maternal grandfather; Diverticulosis in her father; Endometriosis in her maternal grandmother; Heart disease in her maternal grandmother; Hyperlipidemia in her father and mother; Hypertension in her father and paternal grandfather.  3 generation family history reviewed with no family history of developmental delay, seizure, or genetic disorder.     Social History Social History   Social History Narrative   Patient lives with both parents. Deairra attends [employer].       Therapies: Not at the moment; Speech consultation at school.    Allergies Allergies  Allergen Reactions  . Cleaner [Ao-Sept Disinfection-Neutral] Hives    BRAND NAME - KABOOM    Medications Current Outpatient Medications on File Prior to Visit  Medication Sig Dispense Refill  . Ferrous Sulfate (IRON) 325 (65 Fe) MG TABS Take 65 mg by mouth. Vitron-C    . ibuprofen (ADVIL,MOTRIN) 200 MG tablet Take 200 mg by mouth every 6 (six) hours as needed.    Marland Kitchen KYLEENA 19.5 MG IUD 1 Device by Intrauterine route once for 1 dose. 1 Intra Uterine Device 0   No current facility-administered medications on file prior to visit.    The medication list was reviewed and reconciled. All changes or newly prescribed medications were explained.  A complete medication list was provided to the patient/caregiver.  Physical Exam Ht 5\' 6"  (1.676 m)   Wt 153 lb 12.8 oz (69.8 kg)   BMI 24.82 kg/m  VItals limited due to webex visit.  Skin: No rash, No neurocutaneous stigmata. HEENT: Normocephalic, no dysmorphic features, no conjunctival injection, nares patent, mucous membranes moist, oropharynx clear. Resp: normal work of breathing well perfused Abd: non-distended.  Ext: No  deformities, no muscle wasting, ROM full.  Neurological Examination: MS: Awake, alert, interactive.Sitting on couch for some of visit, improvement.  Cranial Nerves: EOM normal, no nystagmus; no ptsosis, face symmetric with full strength of facial muscles, hearing grossly intact. Motor- At least antigravity in all muscle groups. No abnormal movements Reflexes- unable to test Sensation: unable to test sensation. Coordination: No dysmetria on extension of arms bilaterally.  Gait: Normal gait.    Assessment and Plan DARYLE BOYINGTON is a 18 y.o. female with history of autism, developmental delay, tic diorder with compulsive picking, and akathesia despite not being on antipsychotics  who presents for ongoing follow-up.  Medically etiology of underlying diagnosis is unclear.  We have previously discusssed Prader Will testing but have been unable to get labwork completed. We were previously working on buccal swab testing for 26, but today discussed the other tests we need to get.  Discussed sedation for just labwork given no upcoming sedations.  Mother would like to speak with gynecologist and dentist, as  She is delayed in care for both of them, will see if they want any sedation and if so, will get labs in coordination.  If not, will set up sedation for  labwork independent of other providers.    Continue Clonidine, total of 0.4mg  daily  Continue trazodone 100mg  qhs  Patient taking iron for presumed iron deficiency, no recent labwork to follow-up.   Referral to case management to follow-up with mother regarding labwork with gynecologist and dentist.   Advise TSH, free T4, A1C, Ferritin, Prader Willi syndrome labwork due to symptoms.  Also recommend CBC, CMP to review status given lack of routine monitoring and nonverbal status.   Return in about 3 months (around 04/27/2019).  Carylon Perches MD MPH Neurology and [facility]  [place], [place],  [place] [address] Phone: [phone]

## 2019-01-25 NOTE — Progress Notes (Signed)
Medical Nutrition Therapy - Progress Note Appt start time: 2:49 PM Appt end time: 3:19 PM Reason for referral: compulsive eating patterns Referring provider: Dr. Rogers Blocker - Neuro Pertinent medical hx: developmental delay, autism, tic disorder, movement disorder, non-verbal   Assessment: Food allergies: sensitivities to MSG, aspartame, caffeine Pertinent Medications: see medication list Vitamins/Supplements: vitron-C Pertinent labs: none in Epic  (11/9) Anthropometrics per Epic: Ht: 167.6 cm  Wt: 69.8 kg  BMI: 24.8  (2/17) Anthropometrics per Epic: Ht: 167 cm  Wt: 74 kg  BMI: 26.5   Estimated minimum caloric needs: 2000 kcal/day (EER) Estimated minimum protein needs: 0.85 g/kg/day (DRI) Estimated minimum fluid needs: 2000 mL/day (1 mL/kcal)  Primary concerns today: Follow up for compulsive eating patterns causing weight gain. Mom and [employer] staff (Amy) accompanied pt to appt today.  Dietary Intake Hx: Usual eating pattern includes: 3 meals and 2 snacks per day. Pt lives with mom, dad, and [employer] staff to help. Family meals usually, but pt prefers to eat alone. Pt loves carbs. Mom reports pt does not have a texture preference. Family avoids artifical sweeteners. Pt will grab food from plates at other tables in restaurants. Family now following a strict meal schedule with "time tags." When pt asks for food, they will point at the time tag and the clock and say she can eat again when the clock says what the time tag does. Mom reports this has drastically improved pts nagging for food. Mom also reports getting rid of snack cabinet as pt's nagging continued. Mom has hidden snack foods and pt does not know where they are. Caregivers put different snack bites in a muffin tin and provide this to pt at snack time. Preferred foods: carbs  Avoided foods: green vegetables (will eat green beans, okra, fried zucchini, sometimes salad prepared a specific way) Fast-food: breakfast 1x/week  - [employer]angles, [employer], McDonalds --- lunch/dinner 5x/week - Chick-fil-a (8 nuggets, fries, lemonade), Bojangles (chicken supremes), Pizza (pepperoni, mushrooms), Wendy's (chicken sandwich, baked potato), Panera (mac-n-cheese bowl), Jimmy John's (8 inch ham and cheese), K[employer]W Cafeteria (steak and rice OR mac-n-cheese), PF Changs (spicy chicken with rice) During school ([employer] until age 27): free breakfast/lunch school - mom struggles with packing vs letting pt get lunch at school 24-hr recall: Breakfast 8 AM: toast with PB OR sausage breakfast OR cereal (cinnamon cheerios with milk) Snack 10 AM: trail mix OR pretzels with PB OR raisins OR fresh fruit OR granola bars OR string cheese Lunch 12 PM: fast food OR [place] sandwich OR quesadilla (cheese, beans, jalapeno) Snack 3 PM: above Dinner 6 PM: fast food OR protein (steak, pork, burger, ground beef), starch (pasta, beans, sweet potatoes, bread, corn), vegetable (squash, okra, salad) Beverages: 40-48 oz water, 12 oz whole milk or 2% Lactaid, 1 Caprisun Refreshers  Physical Activity: "extremely active" bouncing, twirling, walking around, plays in sandbox, enjoys being outside, interested in swimming - limited screen time - does not like screens, will mostly just listen to the music  GI: more formed since mom stopped junk food snacks  Estimated intake likely meeting needs given 10 lb weight loss.  Nutrition Diagnosis: (7/6) Excessive energy intake related to suspected inability to understand hunger/fullness cues as evidence by parental report of eating habits  Intervention: Mom has gotten rid of snack cabinet as pt continued begging for food, mom did this gradually. Favorites have shifted,    Discussed current diet and changes made in detail. Mom reports concern as pt's preferred foods have shifted and pt  is now refusing some previoulsy loved foods. Encouraged and affirmed caregivers on weight and structured feeding schedule. Mom  reports biggest concern is that pt will not eat vegetables, discussed tips. Discussed need for MVI given poor vegetable intake. All questions answered, caregivers in agreement with plan. Recommendations: - Start a multivitamin - chose one without added iron. Store brand comparable to Flintstone's is a great option. - Continue meal schedule and plan. - Continue offering vegetables - try play therapy using vegetables as toys.  Teach back method used.  Monitoring/Evaluation: Goals to Monitor: - Weight trends - Lab values - Prader Ollen Barges?  Follow-up in 6 months.  Total time spent in counseling: 30 minutes.

## 2019-01-25 NOTE — Patient Instructions (Signed)
-   Start a multivitamin - chose one without added iron. Store brand comparable to Flintstone's is a great option. - Continue meal schedule and plan. - Continue offering vegetables - try play therapy using vegetables as toys.

## 2019-02-22 ENCOUNTER — Encounter (INDEPENDENT_AMBULATORY_CARE_PROVIDER_SITE_OTHER): Payer: Self-pay | Admitting: Pediatrics

## 2019-03-08 ENCOUNTER — Telehealth: Payer: Self-pay | Admitting: Obstetrics & Gynecology

## 2019-03-08 NOTE — Telephone Encounter (Signed)
Patient's mother [name] is calling inquiring about getting some lab work done under sedation for her daughter. Patient's mother [name] has legal guardianship.

## 2019-03-09 NOTE — Telephone Encounter (Signed)
I would check a CBC and iron studies to make sure not anemic.  I bet neurology is going to at least order a CBC.  Also, Pap is going to be due at age 18.  That will be this summer.  I think it's ok to do early and I'd be happy to do that when the sedation occurs if going to be in a location where I can do a pap smear.  Just a thought.  Thanks.

## 2019-03-09 NOTE — Telephone Encounter (Signed)
Spoke with patients mother, Gabrielle [name], ok per dpr. Patient is autistic. Gabrielle [name] placed under sedation 04/07/17. Reports occassional spotting, no heavy menses. Increase in crying "outburst" and lashing out more over the last 2 months, becoming more intense and frequent. Mom suspects may be related to pain. Has discussed with pediatrician and neurologist. Neuro is planning to schedule blood work under sedation.   Mom is asking if Dr. Sabra Heck will need or recommend any specific labs? Consult? IUD check? Patient is scheduled for a dentist appt in Jan, suspects she will wait until after this appt in case anything needs to be done.   Advised mom I will review with Dr. Sabra Heck and return call with recommendations. Mom agreeable.   Last OV 03/21/17  Dr. Sabra Heck -please review and advise.

## 2019-03-10 NOTE — Telephone Encounter (Signed)
Spoke with patients mother, advised per Dr. Sabra Heck. Mom is going to speak with neurologist to provide update. Mom will then return call to [facility] to provide update on scheduling and location, will then review with Dr. Sabra Heck to try and coordinate schedules. Mom thankful for call.   Routing to provider for final review. Patient is agreeable to disposition. Will close encounter.

## 2019-03-25 ENCOUNTER — Telehealth (INDEPENDENT_AMBULATORY_CARE_PROVIDER_SITE_OTHER): Payer: Self-pay | Admitting: Pediatrics

## 2019-03-25 NOTE — Telephone Encounter (Signed)
  Who's calling (name and relationship to patient) : [name] (mom) Best contact number: [phone] Provider they see: Artis Flock  Reason for call: Mom called about patient labs and need someone to call her due to the patient has to be sedated.  Please call.     PRESCRIPTION REFILL ONLY  Name of prescription:  Pharmacy:

## 2019-03-30 ENCOUNTER — Telehealth (INDEPENDENT_AMBULATORY_CARE_PROVIDER_SITE_OTHER): Payer: Self-pay | Admitting: Pediatrics

## 2019-03-30 NOTE — Telephone Encounter (Signed)
  Who's calling (name and relationship to patient) : mom/ [name] Pottenger   Best contact [phone]  Provider they see: Dr. Artis Flock   Reason for call: mom has an update related to labs and mom needs a form filled out for school      PRESCRIPTION REFILL ONLY  Name of prescription:  Pharmacy:

## 2019-04-01 NOTE — Telephone Encounter (Signed)
Left message requesting return call with date of sedated dental procedure in order to try to co-ordinate lab work and Gyn.

## 2019-04-01 NOTE — Telephone Encounter (Signed)
I called patient's mother and she states that Dr. Artis Flock had spoken to her about having lab work done for Gabrielle [name] and she was supposed to talk to her other doctors to know what they wanted. In the process mother tested positive for COVID and dentist appt was cancelled. Mom would like to know if Dr. Artis Flock still needs her to see dentist to rule out any issue?   The whole family is on quarantine right now but when she goes back to school she will need a med authorization form for: Clonidine 1.5 tablet (at school at 1:30pm) and Ibuprofen (PRN) for menstrual cramping. Mother states it will be a while until she returns because she is not able to be tested.

## 2019-04-02 NOTE — Telephone Encounter (Signed)
Please call mom back and let her know I can complete the medication forms.  I would like mother to talk to the dentist, because we are needing to coordinate labwork with any possible sedation.  We have already been in communication with the Ob/Gyn office and are just waiting to figure out who is sedating so we can all coordinate.   Lorenz Coaster MD MPH

## 2019-04-02 NOTE — Telephone Encounter (Signed)
Patient discussed, recommend follow-up with Dentistry to see if she needs sedation.  Labs are not urgent, so ok if we need to wait 1 month. I would like to avoid sedation just for labs, but will try to set it up if necessary.    Lorenz Coaster MD MPH

## 2019-04-02 NOTE — Telephone Encounter (Signed)
Call back to mom [name]- she reports she had to cancel the dental assessment so she does not know if she needs any dental procedures. Mom reports Asli has to be completely sedated before doing any labs. Giving xanax etc orally will not be enough per mom. RN advised will send this back to Dr. Artis Flock to determine if she wants to schedule sedation and where to schedule but will have to be end of Feb according to school since she can not be tested her 2 wks of quarantine does not start until after 2 wks after mom's test.

## 2019-04-02 NOTE — Telephone Encounter (Signed)
See other phone note from 1/15
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
## 2019-04-19 ENCOUNTER — Telehealth: Payer: Self-pay | Admitting: Obstetrics & Gynecology

## 2019-04-19 NOTE — Telephone Encounter (Signed)
Yes.  That is fine.  IUD placed two years ago.  I will speak with her tomorrow.  Thanks.

## 2019-04-19 NOTE — Telephone Encounter (Signed)
Patient's mother Gabrielle [name] is calling to schedule a consult visit with Dr.Miller. [name] is hoping for a virtual visit. DPR on file to talk with mother.

## 2019-04-19 NOTE — Telephone Encounter (Signed)
Spoke with patients mother, Gabrielle [name], ok per dpr. Mom is requesting MyChart visit to discuss plan of care. Reports increased aggression, is concerned she may be experiencing increased pain or discomfort from IUD. Would like to discuss possible causes and next steps for evaluation.   MyChart visit scheduled for 04/20/19 at 11:30am with Dr. Hyacinth Meeker.  MyChart not currently active, activation code previously sent and is current. Mom will return call to office if any further assistance is needed. RN will provide update to Dr. Hyacinth Meeker and return call if any additional recommendations.   Dr. Hyacinth Meeker -ok to proceed with MyChart visit as scheduled?

## 2019-04-20 ENCOUNTER — Telehealth (INDEPENDENT_AMBULATORY_CARE_PROVIDER_SITE_OTHER): Payer: Medicaid Other | Admitting: Obstetrics & Gynecology

## 2019-04-20 ENCOUNTER — Encounter: Payer: Self-pay | Admitting: Obstetrics & Gynecology

## 2019-04-20 DIAGNOSIS — Z30431 Encounter for routine checking of intrauterine contraceptive device: Secondary | ICD-10-CM

## 2019-04-20 DIAGNOSIS — R102 Pelvic and perineal pain: Secondary | ICD-10-CM

## 2019-04-20 DIAGNOSIS — R4689 Other symptoms and signs involving appearance and behavior: Secondary | ICD-10-CM
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
## 2019-04-20 NOTE — Telephone Encounter (Signed)
Encounter closed

## 2019-04-22 ENCOUNTER — Ambulatory Visit (INDEPENDENT_AMBULATORY_CARE_PROVIDER_SITE_OTHER): Payer: Medicaid Other | Admitting: Obstetrics & Gynecology

## 2019-04-22 ENCOUNTER — Ambulatory Visit (INDEPENDENT_AMBULATORY_CARE_PROVIDER_SITE_OTHER): Payer: Medicaid Other

## 2019-04-22 ENCOUNTER — Other Ambulatory Visit: Payer: Self-pay | Admitting: Obstetrics & Gynecology

## 2019-04-22 DIAGNOSIS — Z30431 Encounter for routine checking of intrauterine contraceptive device: Secondary | ICD-10-CM

## 2019-04-22 DIAGNOSIS — R4689 Other symptoms and signs involving appearance and behavior: Secondary | ICD-10-CM | POA: Diagnosis not present

## 2019-04-22 DIAGNOSIS — R102 Pelvic and perineal pain: Secondary | ICD-10-CM

## 2019-04-22 DIAGNOSIS — F840 Autistic disorder: Secondary | ICD-10-CM

## 2019-04-22 NOTE — Progress Notes (Signed)
19 y.o. G0P0000 Single White or Caucasian female here for pelvic ultrasound due to her mother's concern about possible increased pain as cause of increased aggression.  Pt's bleeding is well controlled with Kyleena IUD placed in 04/07/17.    No LMP recorded. (Menstrual status: Irregular Periods).  Contraception: abstinence   Findings:  UTERUS: very difficult ultrasound with sagittal picture showing IUD within uterine cavity.  Due to pt's movement we could not get an adequate image for measurement of ovaries or uterus.  Multiple attempts were made by ultrasonographer as well as with provider present.  Mother was present for entire procedure  Discussion:  Images/findings reviewed mother.  I feel we have enough evidence that the IUD is in correct location that we do not need to repeat this under sedation.  We also discussed that now we need to proceed with blood work but given Clair'e size and strength, this will likely need to be done with sedation.  Will need to communicate with her neurologist to figure out coordination of this.  I will call and then need to call back Alline's mother.  She is comfortable with plan of care.  Assessment:  IUD check Increased aggression Autism  Plan:  Will plan to proceed with blood work for additional evaluation of recent increased aggression.

## 2019-04-25 ENCOUNTER — Encounter: Payer: Self-pay | Admitting: Obstetrics & Gynecology

## 2019-05-03 ENCOUNTER — Other Ambulatory Visit: Payer: Self-pay

## 2019-05-03 ENCOUNTER — Telehealth (INDEPENDENT_AMBULATORY_CARE_PROVIDER_SITE_OTHER): Payer: Medicaid Other | Admitting: Pediatrics

## 2019-05-03 ENCOUNTER — Encounter (INDEPENDENT_AMBULATORY_CARE_PROVIDER_SITE_OTHER): Payer: Self-pay | Admitting: Pediatrics

## 2019-05-03 VITALS — Wt 160.0 lb

## 2019-05-03 DIAGNOSIS — F424 Excoriation (skin-picking) disorder: Secondary | ICD-10-CM

## 2019-05-03 DIAGNOSIS — G2569 Other tics of organic origin: Secondary | ICD-10-CM

## 2019-05-03 DIAGNOSIS — F984 Stereotyped movement disorders: Secondary | ICD-10-CM

## 2019-05-03 DIAGNOSIS — R6889 Other general symptoms and signs: Secondary | ICD-10-CM

## 2019-05-03 DIAGNOSIS — G2571 Drug induced akathisia: Secondary | ICD-10-CM

## 2019-05-03 DIAGNOSIS — F89 Unspecified disorder of psychological development: Secondary | ICD-10-CM

## 2019-05-03 DIAGNOSIS — G479 Sleep disorder, unspecified: Secondary | ICD-10-CM

## 2019-05-03 DIAGNOSIS — F509 Eating disorder, unspecified: Secondary | ICD-10-CM | POA: Diagnosis not present

## 2019-05-03 DIAGNOSIS — R632 Polyphagia: Secondary | ICD-10-CM

## 2019-05-03 DIAGNOSIS — F840 Autistic disorder: Secondary | ICD-10-CM

## 2019-05-03 MED ORDER — TRAZODONE HCL 50 MG PO TABS: 100.0000 mg | ORAL_TABLET | Freq: Every day | ORAL | 3 refills | Status: DC

## 2019-05-03 MED ORDER — CLONIDINE HCL 0.1 MG PO TABS: ORAL_TABLET | ORAL | 3 refills | Status: DC

## 2019-05-03 NOTE — Progress Notes (Signed)
Patient: Gabrielle [name] MRN: [id number] Sex: female DOB: 04/08/2000  Provider: Lorenz Coaster, MD  This is a Pediatric Specialist E-Visit follow up consult provided via WebEx.  Gabrielle [name] and their parent/guardian Gabrielle [name] consented to an E-Visit consult today.  Location of patient: Gabrielle [name] is at home Location of provider: Shaune Pascal is at office Patient was referred by Gabrielle Asters, MD   The following participants were involved in this E-Visit: Gabrielle [name], CMA      Gabrielle Coaster, MD  Chief Complain/ Reason for E-Visit today: Routine Follow-Up  History of Present Illness:  Gabrielle [name] is a 19 y.o. female with history of autism, developmental delay, tic disorder with compulsive picking, and akathesia of unknown etiology who I am seeing for routine follow-up. Patient was last seen on 11/9/ where we discussed sedation for labwork.  Since then, mother called with concerns of increasing behaviors in setting of a change in environment due to mother having COVID and quarentining.  I recommended at that point to move forward when possible with Gyn and Dentist to determine any potential cause of pain causing agitation, and to determine how we will get labwork.   Patient presents today with mother.  Mother recently had COVID, now recovering.  She returned to school, mom and dad are also back at work.  Ride to school has been better with significant considerations.  Gabrielle [name] has been doing better at home, no episodes of aggression at home.  Going to school and parents are back at work which is helping. No longer seeing "goolish faces", less fear episodes. She is good about sleeping through the night now, sleeping 10pm-7am. However she continues to be very extremely hot.  She goes outside in the snow with no jacket, and gets agitated if in the car with the heater on.     Boils have improved, which may be related as well- there were roughly 30. All respond to muciprin, never  needed I&D, however mother concerned that something makes her prone to boils. Eczema is also improving, going to school is helping and using Aveeno lotion is also helping.    Gabrielle [name] saw Gyn and she saw placement of IUD, but didn't get to see uterine lining.  Does not think she needs sedation, but plans to do bloodwork if we can get her sedated. Dentist appointment is first week of march.   Giving meds 8am, 2pm, 8pm.   Past Medical History Past Medical History:  Diagnosis Date  . Anemia   . Anxiety   . Autism disorder    per mother severe autism, very social,  good receptive languange but nonverbal (can do some sign languange) , high tolerence to pain  . Dysmenorrhea   . Menorrhagia   . Nonverbal    PT AUTISTIC  . PONV (postoperative nausea and vomiting)    per mother "when wakes up pt can go from 0 to 60 and starts taking off cords and try to sit up to stand, better when mother and father are they when she wakes up"  . Repetitive movement    due to autism---  chronic picking of own skin and body    Surgical History Past Surgical History:  Procedure Laterality Date  . DENTAL RESTORATION/EXTRACTION WITH X-RAY    . DILATION AND CURETTAGE OF UTERUS N/A 04/07/2017   Procedure: DILATATION AND CURETTAGE;  Surgeon: Gabrielle Bears, MD;  Location: [facility];  Service: Gynecology;  Laterality: N/A;  . INTRAUTERINE DEVICE (  IUD) INSERTION N/A 04/07/2017   Procedure: PLACEMENT OF Gabrielle [name]  IUD;  Surgeon: Gabrielle Bears, MD;  Location: [facility];  Service: Gynecology;  Laterality: N/A;  . MASS EXCISION Left 06/15/2015   Procedure: EXCISION OF LEFT AXILLARY MASS/TETANUS INJECTION;  Surgeon: Gabrielle Form, DO;  Location: [facility];  Service: Plastics;  Laterality: Left;    Family History family history includes Arthritis in her paternal grandmother; Asthma in her mother; Cancer in her maternal grandfather; Diabetes in her maternal grandfather;  Diverticulosis in her father; Endometriosis in her maternal grandmother; Heart disease in her maternal grandmother; Hyperlipidemia in her father and mother; Hypertension in her father and paternal grandfather.   Mother and grandmother with hypothyroidism.  Maternal great aunt with Graves disease.  Paternal grandmother with "thyroid problems" as well.    Social History Social History   Social History Narrative   Patient lives with both parents. Gabrielle [name] attends Gabrielle [name].       Therapies: Not at the moment; Speech consultation at school.    Allergies Allergies  Allergen Reactions  . Cleaner [Ao-Sept Disinfection-Neutral] Hives    BRAND NAME - KABOOM    Medications Current Outpatient Medications on File Prior to Visit  Medication Sig Dispense Refill  . Ascorbic Acid (VITAMIN C PO) Take by mouth.    . Ferrous Sulfate (IRON) 325 (65 Fe) MG TABS Take 65 mg by mouth. Vitron-C    . ibuprofen (ADVIL,MOTRIN) 200 MG tablet Take 200 mg by mouth every 6 (six) hours as needed.    Marland Kitchen KYLEENA 19.5 MG IUD 1 Device by Intrauterine route once for 1 dose. 1 Intra Uterine Device 0  . Multiple Vitamin (MULTIVITAMIN) tablet Take 1 tablet by mouth daily.    . Multiple Vitamins-Minerals (ZINC PO) Take by mouth.     No current facility-administered medications on file prior to visit.   The medication list was reviewed and reconciled. All changes or newly prescribed medications were explained.  A complete medication list was provided to the patient/caregiver.  Physical Exam Vitals deferred due to webex visit General: Calmer than usual, happy. Sitting comfortably with others.  HEENT: normocephalic, no eye or nose discharge.  MMM  Cardiovascular: warm and well perfused Lungs: Normal work of breathing, no rhonchi or stridor Skin: No birthmarks, no skin breakdown Abdomen:non distended Extremities: No contractures or edema. Neuro: EOM intact, face symmetric. Moves all extremities equally  and at least antigravity. No abnormal movements. Normal gait.      Diagnosis:  1. Akathisia   2. Autism   3. Compulsive eating patterns   4. Developmental disability   5. Organic tic disorder   6. Sleeping difficulty   7. Stereotyped movement disorder   8. Overeating   9. Compulsive skin picking   10. Heat intolerance       Assessment and Plan Gabrielle [name] is a 19 y.o. female with history of autism, developmental delay, tic disorder with compulsive picking, and akathesia who I am seeing in follow-up. Symptoms have significantly improved with routine changing back to normal. Mother however continues to report symptoms consistent with akathesia and heat intolerance. I discussed with mother the desire again to get labwork, particularly for ferritin and thyroid labs given symptoms.  We had also previously discussed completing genetic evaluation for Prader-Willi syndrome, especially with her focus on food. Continue to monitor for other causes of potential discomfort, agree that skin may be contributing.  Also discussed transition of care given she  will be 19 this summer, I am happy to keep seeing her but will need to change pediatricians.    - consider dermatology referral for eczema and boils - Plan for labwork to occur with sedation for other causes, or will admit for sedation to get labwork.  - Plan right now for Ferritin, Lipid panel, CMP, CBC, A1C, Thyroid panel, Prader will testing.   - Mother to discuss transition of care with Gabrielle [name], considering going to Gabrielle [name].   - mother wants Masey to be considered for COVID vaccine if she is able.I recommend calling local health departments for further information when adults with medical risk factors are able to get vaccine.   Return in about 3 months (around 07/31/2019).  Carylon Perches MD MPH [facility]  [address], [place], [place] [address] Phone: [phone]   Total time on call:  40 minutes on call, 5 minutes precharting

## 2019-05-10 ENCOUNTER — Telehealth (INDEPENDENT_AMBULATORY_CARE_PROVIDER_SITE_OTHER): Payer: Self-pay | Admitting: Pediatrics

## 2019-05-10 NOTE — Telephone Encounter (Signed)
  Who's calling (name and relationship to patient) : Toure,[name] Best contact number: [phone] Provider they see:  Reason for call:     PRESCRIPTION REFILL ONLY  Name of prescription:  Pharmacy:

## 2019-05-12 ENCOUNTER — Telehealth (INDEPENDENT_AMBULATORY_CARE_PROVIDER_SITE_OTHER): Payer: Self-pay | Admitting: Pediatrics

## 2019-05-12 NOTE — Telephone Encounter (Signed)
  Who's calling (name and relationship to patient) : [name] (mom)  Best contact number: [phone]  Provider they see: Dr. Artis Flock   Reason for call: Mom LVM that she needs a new medication form for school.  They are not giving patient medicine. Please call.    PRESCRIPTION REFILL ONLY  Name of prescription:  Pharmacy:

## 2019-05-12 NOTE — Telephone Encounter (Signed)
Mother is requesting a medication administration form for her afternoon dose of clonidine and also a med auth for ibuprofen as discussed with Dr. Artis Flock at appt.   Gabrielle [name]- RN Sarah   Med Auth forms printed and pending signature.

## 2019-05-14 NOTE — Telephone Encounter (Signed)
Form completed and faxed to [facility].

## 2019-05-20 ENCOUNTER — Telehealth (INDEPENDENT_AMBULATORY_CARE_PROVIDER_SITE_OTHER): Payer: Self-pay | Admitting: Pediatrics

## 2019-05-20 NOTE — Telephone Encounter (Signed)
Form signed, faxed and confirmed to patient's school.

## 2019-05-20 NOTE — Telephone Encounter (Signed)
  Who's calling (name and relationship to patient) : Mom / Melven Sartorius   Best contact number:[phone]  Provider they see: Dr. Artis Flock   Reason for call:Needs New medication form faxed to school. Mom stated that the form was supposed to say Clonidine given at 1:30pm and not 12:00pm      PRESCRIPTION REFILL ONLY  Name of prescription: Clondidine   Pharmacy:

## 2019-05-20 NOTE — Telephone Encounter (Signed)
Edited med [employer] pending provider signature.

## 2019-05-25 ENCOUNTER — Telehealth (INDEPENDENT_AMBULATORY_CARE_PROVIDER_SITE_OTHER): Payer: Self-pay | Admitting: Pediatrics

## 2019-05-25 DIAGNOSIS — Z8742 Personal history of other diseases of the female genital tract: Secondary | ICD-10-CM

## 2019-05-25 DIAGNOSIS — G2571 Drug induced akathisia: Secondary | ICD-10-CM

## 2019-05-25 DIAGNOSIS — R451 Restlessness and agitation: Secondary | ICD-10-CM

## 2019-05-25 DIAGNOSIS — Z5181 Encounter for therapeutic drug level monitoring: Secondary | ICD-10-CM

## 2019-05-25 DIAGNOSIS — R6889 Other general symptoms and signs: Secondary | ICD-10-CM

## 2019-05-25 DIAGNOSIS — R632 Polyphagia: Secondary | ICD-10-CM

## 2019-05-25 DIAGNOSIS — F89 Unspecified disorder of psychological development: Secondary | ICD-10-CM

## 2019-05-25 NOTE — Telephone Encounter (Signed)
Who's calling (name and relationship to patient) : Melven Sartorius mom  Best contact number: [phone]   Provider they see: Dr. Artis Flock  Reason for call: [name] saw dentist there are no immediate concerns that would require sedation. Dr. Vaughan Basta (Pediatric) has written out a lab order. [name] needs to know who is taking responsibility for the lab work I.E. sedation.   Call ID:      PRESCRIPTION REFILL ONLY  Name of prescription:  Pharmacy:

## 2019-05-26 NOTE — Telephone Encounter (Signed)
Called patient's family and left voicemail for family to return my call when possible.   

## 2019-05-26 NOTE — Telephone Encounter (Signed)
I will contact Dr Ledell Peoples and plan for admission to the peds unit for sedation.  I have already discussed the case with him, so it will just be a matter of coordinating it. Please let mother know the inpatient sedation team will be calling her to schedule.   Lorenz Coaster MD MPH

## 2019-05-28 NOTE — Telephone Encounter (Signed)
Family returned called. Requesting a call back

## 2019-05-28 NOTE — Telephone Encounter (Signed)
I called patient's mother back and let her know Dr. Blair Heys message. Mother asked if Dr. Vaughan Basta had sent [place] the lab orders and I let her know it was not received. She will be contacting Dr. Bradd Canary office for them to fax over the lab orders.   Mother states that Dr.Miller had tried to contact Dr. Artis Flock by calling our office but had not been successful. She provided me with Dr. Rondel Baton phone number so Dr. Artis Flock can call her.  Dr. Leda Quail, GYN: [phone]

## 2019-05-31 ENCOUNTER — Encounter (INDEPENDENT_AMBULATORY_CARE_PROVIDER_SITE_OTHER): Payer: Self-pay | Admitting: Pediatrics

## 2019-05-31 NOTE — Telephone Encounter (Signed)
I called Dr Hyacinth Meeker, this is her office and it was already closed. I will try again tomorrow.  I also contacted Dr Ledell Peoples regarding admission, and he is working with admission team to set this up.  I put in the current list of orders, but will confirm with all parties before her admission that there is nothing else recommended.   Lorenz Coaster MD MPH

## 2019-05-31 NOTE — Addendum Note (Signed)
Addended by: Margurite Auerbach on: 05/31/2019 05:32 PM   Modules accepted: Orders

## 2019-06-01 ENCOUNTER — Telehealth (INDEPENDENT_AMBULATORY_CARE_PROVIDER_SITE_OTHER): Payer: Self-pay | Admitting: Pediatrics

## 2019-06-01 NOTE — Telephone Encounter (Signed)
I called [facility] and spoke with Revonda Standard, she states that Dr. Vaughan Basta did order labs but they were faxed to [facility]. I asked she fax them to [place] as these would be drawn under sedation. Revonda Standard verbalized understanding and will be sending orders to [place] through fax.

## 2019-06-01 NOTE — Telephone Encounter (Signed)
Labs have been given to Dr. Artis Flock

## 2019-06-01 NOTE — Telephone Encounter (Signed)
Who's calling (name and relationship to patient) : [facility]   Best contact number: [phone]  Provider they see: Dr. Artis Flock  Reason for call: [facility] pediatrics called stating the received a request for them to send lab work to Pediatric Specialist but they are unsure of what lab work as none has been conducted recently.   Please call back to provide more information with request  Call ID:      PRESCRIPTION REFILL ONLY  Name of prescription:  Pharmacy:

## 2019-06-03 NOTE — Telephone Encounter (Signed)
I called Dr Rondel Baton office again, spoke with her directly regarding Gabrielle [name]'s upcoming admission.    Dr Hyacinth Meeker requesting to do transvaginal ultrasound if possible, if not then transabdominal ultrasound for correct placement. She will speak with mom to consent for this and discuss plan. Dr Hyacinth Meeker requesting to be informed when sedation is scheduled so she can be available. Dr Hyacinth Meeker would like to be present for the ultrasound to see results and act if needed, requests early morning, over lunch, or late in the day, and can not be available the week of March29-April2. Reviewed labwork, will add CBC, FSH testosterone.    Lorenz Coaster MD MPH

## 2019-06-03 NOTE — Addendum Note (Signed)
Addended by: Margurite Auerbach on: 06/03/2019 01:03 PM   Modules accepted: Orders

## 2019-06-03 NOTE — Addendum Note (Signed)
Addended by: Margurite Auerbach on: 06/03/2019 09:59 AM   Modules accepted: Orders

## 2019-06-03 NOTE — Telephone Encounter (Signed)
Paperwork received by Dr Summer for requested labs, crossreferenced for labs already placed.  Added IgA, COVID, CRP, ESR, tissue transglutaminase.   Faby, please contact patient's PCP to confirm that lab request has been received and added, confirm how to send lab results to provider once they are completed.    Lab requisition sent to scan.     MD MPH 

## 2019-06-10 NOTE — Telephone Encounter (Signed)
Mom called to follow up on status of admission. Please advise

## 2019-06-11 NOTE — Telephone Encounter (Signed)
Called patient's family and left voicemail for family to return my call when possible.   

## 2019-06-11 NOTE — Telephone Encounter (Signed)
Admission has been discussed with Dr Ledell Peoples, who sent it on to [place] to schedule.  I am under the impression this will be scheduled with anesthesia.  I have discussed with her Ob/Gyn who requested an ultrasound and wants to be present for that, and put in orders for labs, both my own and the labs ordered by Dr Vaughan Basta.  I have not heard back from [place] yet, and can't seem to cc her to this message.  However, it appears she has now seen [facility] dentistry who recommended patient be seen in OR under GA...this is not what had been recommended by her prior dentist.  Please call mother to clarify, because if dentistry is sedating her, we can get labwork with that encounter.   Lorenz Coaster MD MPH
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
## 2019-06-21 NOTE — Telephone Encounter (Signed)
I called both patient contact numbers and left messages on both to please call our office to confirm plan to have sedation at [facility].  If patient being sedated at [facility], I would recommend they obtain labwork there.  OB had planned on doing ultrasound, mother may want to discuss further with OB if that is still desired.   Lorenz Coaster MD MPH

## 2019-06-23 MED ORDER — ARIPIPRAZOLE 5 MG PO TABS: ORAL_TABLET | ORAL | 1 refills | Status: DC

## 2019-06-23 NOTE — Addendum Note (Signed)
Addended by: Margurite Auerbach on: 06/23/2019 03:06 PM   Modules accepted: Orders
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
## 2019-07-01 NOTE — Telephone Encounter (Signed)
I have been in discussionwith Dr Ledell Peoples and the sedation nurse regarding this patient, and they have been speaking with anesthesia.  They request that OB post the procedure to be able to reserve the OR space, and then anesthesia is willing to coordinate the sedation and get the labs as well.  Patient will need a COVID test prior to admission, which I will discuss with mother about, but posting her to the OR schedule should also help with getting this coordinated.   I attempted to contact Dr Rondel Baton office (the [facility]) to discuss this, but was left on hold for 10+ minutes.  I have asked our case manager to try again later, and I will send a staff message to Dr Hyacinth Meeker directly.   Lorenz Coaster MD MPH

## 2019-07-05 ENCOUNTER — Telehealth (INDEPENDENT_AMBULATORY_CARE_PROVIDER_SITE_OTHER): Payer: Self-pay | Admitting: Pediatrics

## 2019-07-05 NOTE — Telephone Encounter (Signed)
  Who's calling (name and relationship to patient) : Placido Sou, mother  Best contact number: [phone]  Provider they see: Artis Flock  Reason for call: Started 5MG  of Abilify last week. Aggression has stopped. Reports patient becomes sleepy each day around 3:00PM. Mother is not aware if patient is swallowing it or chewing it. Seems as if patient moves it around in her mouth for some time.   Also, has a question about patient getting labs done.      PRESCRIPTION REFILL ONLY  Name of prescription:  Pharmacy:

## 2019-07-06 NOTE — Telephone Encounter (Signed)
Mother called back, no aggression since starting Abilify. Getting 1 tablet in morning, drowsy in the afternoon, now getting up in the middle of the night. I recommended giving it to her after school so sedation affects her at night, not increasing dose.   With labwork, discussed we have to have anesthesia in the OR for NO, and that is where we are stuck.  We are currently waiting for Dr Hyacinth Meeker to get back to [place] on "posting" the procedure, and then we can move forward.  Mother feels that the sedation with Dr Hyacinth Meeker went well previously and was outpatient but with NO. I reviewed requirements with inpatient that it has to have anesthesia.  Mother is going to call Dr Hyacinth Meeker to review her previous procedure that was successful, I invited her to please let us know what was used to see if we can try it. Also reviewed dentist again, mother claims they will not sedate for routine cleaning, which is all she needs.  I did review need for COVID test and mother feels this is daunting, but I agreed to help her coordinate it if OR appointment made.   Lorenz Coaster MD MPH

## 2019-07-08 ENCOUNTER — Telehealth: Payer: Self-pay

## 2019-07-08 NOTE — Telephone Encounter (Signed)
Spoke with Dr Hyacinth Meeker. Given instructions to call sedation RN Dot Lanes at [phone].  Call placed to [place], RN, left message to speak with me or Noreene Larsson, Charity fundraiser.   Routing to Dr Hyacinth Meeker for review.  Cc: Yvonna Alanis

## 2019-07-08 NOTE — Telephone Encounter (Signed)
Patients mother called to speak to a nurse or Dr. Hyacinth Meeker regarding patients upcoming procedures.

## 2019-07-08 NOTE — Telephone Encounter (Signed)
Call returned from [place], [place] at sedation center.   Dot Lanes, RN states:  1. pt can have lab work only with NO gas at Endo suite without Dr Hyacinth Meeker present. Who will order lab work? Peds or Dr Artis Flock  2.  If needs repeat PUS for IUD placement check, then pt will have to be scheduled through OR schedule at [facility] or [facility].  Both places require Covid test prior per Head of Anesthesia.   Will pt need Covid test at [facility] site or with pediatrician office?   Routing to Dr Hyacinth Meeker for review and recommendations.  Cc: Yvonna Alanis

## 2019-07-08 NOTE — Telephone Encounter (Signed)
Spoke with pt's mother, [name], per [place].  Pt non verbal autistic.   Mother states wanting to know plan of care for possible upcoming PUS and lab work. Mother states waiting on Dr Artis Flock and Dr Hyacinth Meeker to make plan.   Will speak to Dr Hyacinth Meeker and return call to mother. Mother agreeable.

## 2019-07-25 NOTE — Progress Notes (Signed)
Patient: Gabrielle [name] MRN: [id number] Sex: female DOB: Jan 21, 2001  Provider: Lorenz Coaster, MD Location of Care: [facility]  This is a Pediatric Specialist E-Visit follow up consult provided via WebEx.  Gabrielle [name] and their parent/guardian Gabrielle [name] consented to an E-Visit consult today.  Location of patient: [name] is at home Location of provider: Shaune Pascal is at office Patient was referred by Ronney Asters, MD   The following participants were involved in this E-Visit: Lorre Munroe, CMA      Lorenz Coaster, MD  History of Present Illness:  Gabrielle [name] is a 19 y.o. female with history of autism, developmental delay, tic disorder with compulsive picking, and akathesia of unknown etiology who I am seeing for routine follow-up. Patient was last seen on 05/03/19 where sx improved with routine changing back to normal. Mother reported symptoms consistent with akathesia and heat intolerance, plan for labwork..Since the last appointment, I have been in contact with mother via telephone due to sedation planning, but also Gabrielle [name] has been getting more aggressive. Abilify was ordered and improved aggression. Waiting for Dr. Hyacinth Meeker on posting the procedure so labs can be obtained under sedation .   Patient presents today with mom.  She was able to confirm that Gabrielle [name] was sedated at the [facility] surgical center last time.  I confirmed that mother has not heard from Dr Hyacinth Meeker. There are notes about sedation with [facility] dentistry, but mother again confirms they would only sedate her for an abnormality, not a normal cleaning.    Behaviorally, abilify was very helpful at first, she completely stopped any aggression and was sleeping better.  Mom didn't see sleepiness during the day, she was wanting to be up in the middle of the night.  She moved it up bedtime to the afternoon and she Aerionna started being really sleepy during the day and napping.  This was knocking her out where they could do anything while she was asleep.  Mother cut it back to 2.5mg  abilify, and she wasn't tired but all her picking and agitation came back.  Mother took her back to 5mg  daily about a week ago, and she is waking up in the middle of the night but now also napping.  Mother concerned about sedation.   Past Medical History Past Medical History:  Diagnosis Date  . Anemia   . Anxiety   . Autism disorder    per mother severe autism, very social,  good receptive languange but nonverbal (can do some sign languange) , high tolerence to pain  . Dysmenorrhea   . Menorrhagia   . Nonverbal    PT AUTISTIC  . PONV (postoperative nausea and vomiting)    per mother "when wakes up pt can go from 0 to 60 and starts taking off cords and try to sit up to stand, better when mother and father are they when she wakes up"  . Repetitive movement    due to autism---  chronic picking of own skin and body    Surgical History Past Surgical History:  Procedure Laterality Date  . DENTAL RESTORATION/EXTRACTION WITH X-RAY    . DILATION AND CURETTAGE OF UTERUS N/A 04/07/2017   Procedure: DILATATION AND CURETTAGE;  Surgeon: 04/09/2017, MD;  Location: [facility];  Service: Gynecology;  Laterality: N/A;  . INTRAUTERINE DEVICE (IUD) INSERTION N/A 04/07/2017   Procedure: PLACEMENT OF 04/09/2017  IUD;  Surgeon: Rutha Bouchard, MD;  Location: [facility];  Service: Gynecology;  Laterality: N/A;  . MASS EXCISION Left 06/15/2015   Procedure: EXCISION OF LEFT AXILLARY MASS/TETANUS INJECTION;  Surgeon: Wallace Going, DO;  Location: [place];  Service: Plastics;  Laterality: Left;    Family History family history includes Arthritis in her paternal grandmother; Asthma in her mother; Cancer in her maternal grandfather; Diabetes in her maternal grandfather; Diverticulosis in her father; Endometriosis in her maternal grandmother; Heart disease  in her maternal grandmother; Hyperlipidemia in her father and mother; Hypertension in her father and paternal grandfather.   Social History Social History   Social History Narrative   Patient lives with both parents. Shalaina attends [employer].       Therapies: Not at the moment; Speech consultation at school.    Allergies Allergies  Allergen Reactions  . Cleaner [Ao-Sept Disinfection-Neutral] Hives    BRAND NAME - KABOOM    Medications Current Outpatient Medications on File Prior to Visit  Medication Sig Dispense Refill  . ibuprofen (ADVIL,MOTRIN) 200 MG tablet Take 200 mg by mouth every 6 (six) hours as needed.    . Multiple Vitamins-Minerals (ZINC PO) Take by mouth.    . Ascorbic Acid (VITAMIN C PO) Take by mouth.    . Ferrous Sulfate (IRON) 325 (65 Fe) MG TABS Take 65 mg by mouth. Vitron-C    . KYLEENA 19.5 MG IUD 1 Device by Intrauterine route once for 1 dose. 1 Intra Uterine Device 0  . Multiple Vitamin (MULTIVITAMIN) tablet Take 1 tablet by mouth daily.     No current facility-administered medications on file prior to visit.   The medication list was reviewed and reconciled. All changes or newly prescribed medications were explained.  A complete medication list was provided to the patient/caregiver.  Physical Exam Wt 150 lb (68 kg) Comment: reported by mother  BMI 24.21 kg/m  Facility age limit for growth percentiles is 20 years.  No exam data present  Gen: well appearing young woman, obese Skin: No rash, No neurocutaneous stigmata. HEENT: Normocephalic, no dysmorphic features, no conjunctival injection, nares patent, mucous membranes moist, oropharynx clear. Resp: normal work of breathing DP:OEUMPNT well perfused  Neurological Examination: MS: MS: Awake, alert, but sitting down.  Nonverbal but attentive to mother.  Cranial Nerves: EOM normal, no nystagmus; no ptsosis, face symmetric with full strength of facial muscles, hearing grossly intact.    Motor/Coordination- At least antigravity in all muscle groups. No abnormal movements. No dysmetria on extension of arms bilaterally.  No difficulty with balance or strength when squatting and standing.  Gait: Normal gait. Tandem gait was normal. Was able to perform toe walking and heel walking without difficulty   Diagnosis: 1. Overeating   2. Akathisia   3. Developmental disability   4. Heat sensitivity   5. Agitation   6. Compulsive eating patterns   7. Sleeping difficulty   8. Autism     Assessment and Plan DESTANY SEVERNS is a 19 y.o. female with autism, developmental delay, tic disorder with compulsive picking, and akathesia of unknown etiology who I am seeing for routine follow-up. Behaviors have improved with Abilify making hyperthyroidism less concerning, however still in need of initial labwork given starting antipsychotic.  Continuing to work with inpatient team to coordinate sedation for labs.     Continue abilify, trazodone, clonidine at current doses.   Continue ongoing discussions regarding admission for labwork and possible COVID shot.   Labwork ordered 06/03/19 for planned sedation  I  spent 30 minutes on day of service on this patient including discussion with patient and family, coordination with other providers, and review of chart    Return in about 4 weeks (around 08/23/2019).  Lorenz Coaster MD MPH Neurology and Neurodevelopment [facility]  [address] [place], [place], [place] [address] Phone: [phone]  By signing below, I, Soyla Murphy attest that this documentation has been prepared under the direction of Lorenz Coaster, MD.   I, Lorenz Coaster, MD personally performed the services described in this documentation. All medical record entries made by the scribe were at my direction. I have reviewed the chart and agree that the record reflects my personal performance and is accurate and complete Electronically signed by Soyla Murphy and Lorenz Coaster, MD 08/18/19 at 8:01am

## 2019-07-26 ENCOUNTER — Telehealth (INDEPENDENT_AMBULATORY_CARE_PROVIDER_SITE_OTHER): Payer: Medicaid Other | Admitting: Pediatrics

## 2019-07-26 ENCOUNTER — Encounter (INDEPENDENT_AMBULATORY_CARE_PROVIDER_SITE_OTHER): Payer: Self-pay | Admitting: Pediatrics

## 2019-07-26 VITALS — Wt 150.0 lb

## 2019-07-26 DIAGNOSIS — G2571 Drug induced akathisia: Secondary | ICD-10-CM

## 2019-07-26 DIAGNOSIS — R632 Polyphagia: Secondary | ICD-10-CM | POA: Diagnosis not present

## 2019-07-26 DIAGNOSIS — R6889 Other general symptoms and signs: Secondary | ICD-10-CM

## 2019-07-26 DIAGNOSIS — F509 Eating disorder, unspecified: Secondary | ICD-10-CM

## 2019-07-26 DIAGNOSIS — F89 Unspecified disorder of psychological development: Secondary | ICD-10-CM | POA: Diagnosis not present

## 2019-07-26 DIAGNOSIS — F840 Autistic disorder: Secondary | ICD-10-CM

## 2019-07-26 DIAGNOSIS — G479 Sleep disorder, unspecified: Secondary | ICD-10-CM

## 2019-07-26 DIAGNOSIS — R451 Restlessness and agitation: Secondary | ICD-10-CM

## 2019-07-26 NOTE — Telephone Encounter (Signed)
Spoke with Mrs. Gabrielle [name].  Pt's mother would like to have lab work done in [employer].  Dr. Artis Flock has advised she can take care of the pre-procedure Covid test.  Can we proceed with scheduling or should we have Dr. Artis Flock do these.  She does not have any admitting privileges.  I can order the labs?  Is there anyway to have Covid vaccine administered at the same time?  Thanks.

## 2019-07-27 ENCOUNTER — Encounter (INDEPENDENT_AMBULATORY_CARE_PROVIDER_SITE_OTHER): Payer: Self-pay | Admitting: Pediatrics

## 2019-07-27 MED ORDER — ARIPIPRAZOLE 5 MG PO TABS: 5.0000 mg | ORAL_TABLET | Freq: Every day | ORAL | 1 refills | Status: DC

## 2019-07-27 MED ORDER — TRAZODONE HCL 50 MG PO TABS: 100.0000 mg | ORAL_TABLET | Freq: Every day | ORAL | 3 refills | Status: DC

## 2019-07-27 MED ORDER — CLONIDINE HCL 0.1 MG PO TABS: 0.1000 mg | ORAL_TABLET | Freq: Three times a day (TID) | ORAL | 3 refills | Status: DC

## 2019-07-27 NOTE — Telephone Encounter (Signed)
Call placed to [facility]. Spoke to [place].  Since mother of pt ok with endo suite, Dr Artis Flock can call and schedule. Labs also can be ordered by Dr Artis Flock or PCP, Dr Levada Schilling.  Pt cannot have Covid vaccine during labs at Endo suite due to no availability at hospital pharmacy.

## 2019-07-27 NOTE — Telephone Encounter (Signed)
Call to pt's mother, ok per DPR. Spoke with Gabrielle [name]. Mother given update on pt's plan of care. Mother agreeable and verbalized understanding. Dr Artis Flock office to contact pt's mother for lab orders and scheduling. Mother agreeable.   Routing to Dr Hyacinth Meeker for review.  Encounter closed.

## 2019-08-03 ENCOUNTER — Telehealth (INDEPENDENT_AMBULATORY_CARE_PROVIDER_SITE_OTHER): Payer: Self-pay | Admitting: Pediatrics

## 2019-08-03 NOTE — Telephone Encounter (Signed)
I called and spoke with mother about admission 5/26, confirmed this will be in the endoscopy lab with Nitrous Oxide.    I discussed need for NPO status.  Mother prefers first thing in the morning because [place] can get aggressive when hungry.    Discussed COVID vaccine, mother does not want J&J vaccine as she had a friend recently have side effects.  She prefers Architectural technologist with the understanding she will need a second shot.    Offered COVID testing in short stay prior to the sedation.  Mother concerned that agitating her with the test then making her stay in an unusual area will further agitate her prior to the procedure, prefers testing as an outpatient beforehand.  Requesting information on when and where she can go for this. I advised the hospital staff will be contacting mother with more specifics on NPO status, timing of procedure and COVID testing.  Mother in agreement.     Lorenz Coaster MD MPH

## 2019-08-03 NOTE — Telephone Encounter (Signed)
Contacted mother, let her know short stay will contact her with instructions, however recommendation is NPO after midnight, arrive at Admitting 08:15am. Reviewed that she should take her medications with the smallest amount of fluid in the morning. COVID test within 4 days from PCP is acceptable.  Mother will contact PCP for this testing.    I talked to mom about the mobile COVID vaccine unit and mother is interested.  She states however that it does take about 5 people to holdher down. She can have 3 people at home, if there are 2-3 people willing to help from the mobile unit.  I let mom know I would send this on to mobile unit and she will get a call.  Mom is also going to speak with PCP when she goes to see if they can get the vaccine there, has had them there before and they just prepare all the staff to help.  With that, will cancel the plan to have the COVID vaccine while sedated.   Lorenz Coaster MD MPH

## 2019-08-06 NOTE — Telephone Encounter (Signed)
error 

## 2019-08-09 ENCOUNTER — Encounter (HOSPITAL_COMMUNITY): Payer: Self-pay

## 2019-08-11 ENCOUNTER — Encounter (HOSPITAL_COMMUNITY): Payer: Self-pay

## 2019-08-11 ENCOUNTER — Other Ambulatory Visit: Payer: Self-pay

## 2019-08-11 ENCOUNTER — Encounter (HOSPITAL_COMMUNITY): Payer: Self-pay | Admitting: Certified Registered"

## 2019-08-11 ENCOUNTER — Ambulatory Visit (HOSPITAL_COMMUNITY): Payer: Self-pay | Admitting: Certified Registered"

## 2019-08-11 ENCOUNTER — Ambulatory Visit (HOSPITAL_COMMUNITY)
Admission: RE | Admit: 2019-08-11 | Discharge: 2019-08-11 | Disposition: A | Discharge status: Home / Self Care | Payer: Medicaid Other | Source: Ambulatory Visit | Attending: Pediatrics | Admitting: Pediatrics

## 2019-08-11 DIAGNOSIS — G2571 Drug induced akathisia: Secondary | ICD-10-CM | POA: Insufficient documentation

## 2019-08-11 DIAGNOSIS — Z0184 Encounter for antibody response examination: Secondary | ICD-10-CM | POA: Insufficient documentation

## 2019-08-11 DIAGNOSIS — Z20822 Contact with and (suspected) exposure to covid-19: Secondary | ICD-10-CM | POA: Diagnosis not present

## 2019-08-11 DIAGNOSIS — Z5181 Encounter for therapeutic drug level monitoring: Secondary | ICD-10-CM | POA: Insufficient documentation

## 2019-08-11 DIAGNOSIS — F89 Unspecified disorder of psychological development: Secondary | ICD-10-CM | POA: Insufficient documentation

## 2019-08-11 LAB — C-REACTIVE PROTEIN: CRP: <0.5 mg/dL (ref <1.0)

## 2019-08-11 LAB — COMPREHENSIVE METABOLIC PANEL
ALT: 11 U/L (ref 0–44)
AST: 20 U/L (ref 15–41)
Albumin: 4.3 g/dL (ref 3.5–5.0)
Alkaline Phosphatase: 53 U/L (ref 38–126)
Anion gap: 9 (ref 5–15)
BUN: 10 mg/dL (ref 6–20)
CO2: 24 mmol/L (ref 22–32)
Calcium: 8.9 mg/dL (ref 8.9–10.3)
Chloride: 105 mmol/L (ref 98–111)
Creatinine, Ser: 0.58 mg/dL (ref 0.44–1.00)
GFR calc Af Amer: >60 mL/min (ref >60)
GFR calc non Af Amer: >60 mL/min (ref >60)
Glucose, Bld: 94 mg/dL (ref 70–99)
Potassium: 3.8 mmol/L (ref 3.5–5.1)
Sodium: 138 mmol/L (ref 135–145)
Total Bilirubin: 0.5 mg/dL (ref 0.3–1.2)
Total Protein: 7.2 g/dL (ref 6.5–8.1)

## 2019-08-11 LAB — CBC WITH DIFFERENTIAL/PLATELET
Abs Immature Granulocytes: 0.02 10*3/uL (ref 0.00–0.07)
Basophils Absolute: 0 10*3/uL (ref 0.0–0.1)
Basophils Relative: 1 %
Eosinophils Absolute: 0.1 10*3/uL (ref 0.0–0.5)
Eosinophils Relative: 1 %
HCT: 40.8 % (ref 36.0–46.0)
Hemoglobin: 13.3 g/dL (ref 12.0–15.0)
Immature Granulocytes: 0 %
Lymphocytes Relative: 31 %
Lymphs Abs: 2 10*3/uL (ref 0.7–4.0)
MCH: 31.4 pg (ref 26.0–34.0)
MCHC: 32.6 g/dL (ref 30.0–36.0)
MCV: 96.5 fL (ref 80.0–100.0)
Monocytes Absolute: 0.4 10*3/uL (ref 0.1–1.0)
Monocytes Relative: 6 %
Neutro Abs: 4.1 10*3/uL (ref 1.7–7.7)
Neutrophils Relative %: 61 %
Platelets: 233 10*3/uL (ref 150–400)
RBC: 4.23 MIL/uL (ref 3.87–5.11)
RDW: 11.5 % (ref 11.5–15.5)
WBC: 6.7 10*3/uL (ref 4.0–10.5)
nRBC: 0 % (ref 0.0–0.2)

## 2019-08-11 LAB — SARS CORONAVIRUS 2 BY RT PCR (HOSPITAL ORDER, PERFORMED IN ~~LOC~~ HOSPITAL LAB): SARS Coronavirus 2: NEGATIVE

## 2019-08-11 LAB — LIPID PANEL
Cholesterol: 165 mg/dL (ref 0–200)
HDL: 51 mg/dL (ref >40)
LDL Cholesterol: 102 mg/dL — ABNORMAL HIGH (ref 0–99)
Total CHOL/HDL Ratio: 3.2 RATIO
Triglycerides: 61 mg/dL (ref <150)
VLDL: 12 mg/dL (ref 0–40)

## 2019-08-11 LAB — VITAMIN D 25 HYDROXY (VIT D DEFICIENCY, FRACTURES): Vit D, 25-Hydroxy: 25.48 ng/mL — ABNORMAL LOW (ref 30–100)

## 2019-08-11 LAB — T4, FREE: Free T4: 0.94 ng/dL (ref 0.61–1.12)

## 2019-08-11 LAB — HEMOGLOBIN A1C
Hgb A1c MFr Bld: 5.4 % (ref 4.8–5.6)
Mean Plasma Glucose: 108.28 mg/dL

## 2019-08-11 LAB — FERRITIN: Ferritin: 51 ng/mL (ref 11–307)

## 2019-08-11 LAB — TSH: TSH: 2.188 u[IU]/mL (ref 0.350–4.500)

## 2019-08-11 LAB — SEDIMENTATION RATE: Sed Rate: 2 mm/hr (ref 0–22)

## 2019-08-11 MED ORDER — DEXMEDETOMIDINE HCL 200 MCG/2ML IV SOLN
INTRAVENOUS | Status: DC | PRN
  Administered 2019-08-11 (×3): 4 ug via INTRAVENOUS
  Administered 2019-08-11 (×2): 8 ug via INTRAVENOUS

## 2019-08-11 MED ORDER — LACTATED RINGERS IV SOLN: INTRAVENOUS | Status: DC | PRN

## 2019-08-11 NOTE — Transfer of Care (Signed)
Immediate Anesthesia Transfer of Care Note  Patient: Gabrielle [name]  Procedure(s) Performed: LAB  Patient Location: PACU and Endoscopy Unit  Anesthesia Type:MAC  Level of Consciousness: drowsy  Airway & Oxygen Therapy: Patient Spontanous Breathing  Post-op Assessment: Report given to RN and Post -op Vital signs reviewed and stable  Post vital signs: Reviewed and stable  Last Vitals:  Vitals Value Taken Time  BP    Temp    Pulse    Resp    SpO2      Last Pain: There were no vitals filed for this visit.       Complications: No apparent anesthesia complications

## 2019-08-11 NOTE — Anesthesia Preprocedure Evaluation (Signed)
Anesthesia Evaluation  Patient identified by MRN, date of birth, ID band Patient awake    Reviewed: Allergy & Precautions, H&P , NPO status , Patient's Chart, lab work & pertinent test results  History of Anesthesia Complications (+) PONV  Airway Mallampati: II  TM Distance: >3 FB Neck ROM: Full    Dental no notable dental hx. (+) Teeth Intact, Dental Advisory Given   Pulmonary neg pulmonary ROS,    Pulmonary exam normal breath sounds clear to auscultation       Cardiovascular negative cardio ROS   Rhythm:Regular Rate:Normal     Neuro/Psych Anxiety Autism Developmental disability    GI/Hepatic negative GI ROS, Neg liver ROS,   Endo/Other  negative endocrine ROS  Renal/GU negative Renal ROS  negative genitourinary   Musculoskeletal   Abdominal   Peds  Hematology  (+) Blood dyscrasia, anemia ,   Anesthesia Other Findings   Reproductive/Obstetrics negative OB ROS                             Anesthesia Physical Anesthesia Plan  ASA: II  Anesthesia Plan: General   Post-op Pain Management:    Induction: Inhalational  PONV Risk Score and Plan: 4 or greater and Treatment may vary due to age or medical condition  Airway Management Planned: Mask  Additional Equipment:   Intra-op Plan:   Post-operative Plan:   Informed Consent: I have reviewed the patients History and Physical, chart, labs and discussed the procedure including the risks, benefits and alternatives for the proposed anesthesia with the patient or authorized representative who has indicated his/her understanding and acceptance.     Dental advisory given  Plan Discussed with: CRNA  Anesthesia Plan Comments:         Anesthesia Quick Evaluation

## 2019-08-11 NOTE — Anesthesia Postprocedure Evaluation (Signed)
Anesthesia Post Note  Patient: Gabrielle [name]  Procedure(s) Performed: LAB     Patient location during evaluation: PACU Anesthesia Type: General Level of consciousness: awake and alert Pain management: pain level controlled Vital Signs Assessment: post-procedure vital signs reviewed and stable Respiratory status: spontaneous breathing, nonlabored ventilation and respiratory function stable Cardiovascular status: blood pressure returned to baseline and stable Postop Assessment: no apparent nausea or vomiting Anesthetic complications: no    Last Vitals: There were no vitals filed for this visit.  Last Pain: There were no vitals filed for this visit.               Dmari Schubring,W. EDMOND

## 2019-08-12 LAB — TISSUE TRANSGLUTAMINASE, IGG: Tissue Transglut Ab: <2 U/mL (ref 0–5)

## 2019-08-12 LAB — FOLLICLE STIMULATING HORMONE: FSH: 5.8 m[IU]/mL

## 2019-08-12 LAB — TESTOSTERONE: Testosterone: 28 ng/dL (ref 13–71)

## 2019-08-12 LAB — SAR COV2 SEROLOGY (COVID19)AB(IGG),IA: SARS-CoV-2 Ab, IgG: NONREACTIVE

## 2019-08-12 LAB — IGA: IgA: 165 mg/dL (ref 87–352)

## 2019-08-12 LAB — TISSUE TRANSGLUTAMINASE, IGA: Tissue Transglutaminase Ab, IgA: <2 U/mL (ref 0–3)

## 2019-08-13 ENCOUNTER — Telehealth (INDEPENDENT_AMBULATORY_CARE_PROVIDER_SITE_OTHER): Payer: Self-pay | Admitting: Pediatrics

## 2019-08-13 NOTE — Telephone Encounter (Signed)
Contacted mother regarding sedation and labwork.  She did fight, but confirmed they got what she needed.  I reviewed the labwork, everything so far normal. In particular, thyroid is not cause of temperature intolerance. We are still awaiting Prader-Willi testing.   Mother reporting her naps are still occurring, but only lasts about 1.5 hours but no longer acting sleepy.  Refused to get in the car today initially, but they were able to get in.   Confirmed with mother that she is on the list for mobile COVID vaccine.    Total time 11 minutes  Lorenz Coaster MD MPH

## 2019-08-20 LAB — PRADER-WILLI SYNDROME DNA

## 2019-09-25 ENCOUNTER — Other Ambulatory Visit (INDEPENDENT_AMBULATORY_CARE_PROVIDER_SITE_OTHER): Payer: Self-pay | Admitting: Pediatrics

## 2019-11-10 ENCOUNTER — Other Ambulatory Visit: Payer: Self-pay

## 2019-11-10 ENCOUNTER — Encounter (INDEPENDENT_AMBULATORY_CARE_PROVIDER_SITE_OTHER): Payer: Self-pay | Admitting: Pediatrics

## 2019-11-10 ENCOUNTER — Telehealth (INDEPENDENT_AMBULATORY_CARE_PROVIDER_SITE_OTHER): Payer: Medicaid Other | Admitting: Pediatrics

## 2019-11-10 VITALS — Ht 66.0 in | Wt 160.0 lb

## 2019-11-10 DIAGNOSIS — R451 Restlessness and agitation: Secondary | ICD-10-CM

## 2019-11-10 DIAGNOSIS — F509 Eating disorder, unspecified: Secondary | ICD-10-CM | POA: Diagnosis not present

## 2019-11-10 DIAGNOSIS — G479 Sleep disorder, unspecified: Secondary | ICD-10-CM

## 2019-11-10 DIAGNOSIS — F840 Autistic disorder: Secondary | ICD-10-CM | POA: Diagnosis not present

## 2019-11-10 DIAGNOSIS — G2571 Drug induced akathisia: Secondary | ICD-10-CM | POA: Diagnosis not present

## 2019-11-10 DIAGNOSIS — R6889 Other general symptoms and signs: Secondary | ICD-10-CM

## 2019-11-10 DIAGNOSIS — G2569 Other tics of organic origin: Secondary | ICD-10-CM

## 2019-11-10 DIAGNOSIS — F424 Excoriation (skin-picking) disorder: Secondary | ICD-10-CM

## 2019-11-10 DIAGNOSIS — Z79899 Other long term (current) drug therapy: Secondary | ICD-10-CM

## 2019-11-10 DIAGNOSIS — F89 Unspecified disorder of psychological development: Secondary | ICD-10-CM

## 2019-11-10 MED ORDER — ARIPIPRAZOLE 10 MG PO TABS
10.0000 mg | ORAL_TABLET | Freq: Every day | ORAL | 3 refills | Status: DC
Start: 2019-11-10 — End: 2020-03-03

## 2019-11-10 MED ORDER — CLONIDINE HCL 0.1 MG PO TABS: 0.1000 mg | ORAL_TABLET | Freq: Two times a day (BID) | ORAL | 3 refills | Status: DC

## 2019-11-10 NOTE — Progress Notes (Signed)
Patient: Gabrielle Gabrielle [name] MRN: [id number] Sex: female DOB: [birth year]  Provider: Lorenz Coaster, MD Location of Care: [facility]  This is [name] E-Visit follow up consult provided via WebEx. Gabrielle Gabrielle [name] their parent/guardian Gabrielle Jonesconsented to an E-Visit consult today.  Location of patient:[place] Location of provider: [facility] atoffice Patient was referred bySummer, Victorino Dike, MD  Note type: Routine follow-up  History of Present Illness:  Gabrielle Gabrielle [name] is Gabrielle [name] 19 y.o. female with history of autism, developmental delay, tic disorder with compulsive picking, and akathesia of unknown etiology who I am seeing for routine follow-up. Patient was last seen on 07/26/2019 where Abilify, trazodone and clonidine were continued and labswork was ordered.  Since the last appointment,  patient has had no ED visits or hospital admissions.  Patient presents today with mother for Gabrielle [name] virtual follow up visit. Mom reports that Gabrielle Gabrielle [name] has been doing well. She has noticed improvement in her aggression and undressing since taking Abilify 5mg . She has decreased her Clonidine dose to 0.3 mg and has noticed that she is not as sleepy and is taking fewer naps. She sleeps well at night. She has noticed an increase in repetitive movements such as rocking in the car. It is not as severe as before. Mother also said that she repeatedly rubs the back of her head and as Gabrielle [name] result, has Gabrielle [name] bald and thinning patch (see photo). Pediatrician has evaluated and does not feel that there is an infectious cause for this and that it is due to friction from rubbing. She has also started picking at her skin again and has Gabrielle [name] partially healed wound on her elbow.   Gabrielle [name] had sedation for her lab draw and mother is aware that all results, including Prader-Willi, have returned and are normal.  Mother is working on Gabrielle Gabrielle [name]  Systems developer. She would like to pursue the Covid vaccine for her through the mobile vaccine unit.   Past Medical History Past Medical History:  Diagnosis Date  . Anemia   . Anxiety   . Autism disorder    per mother severe autism, very social,  good receptive languange but nonverbal (can do some sign languange) , high tolerence to pain  . Dysmenorrhea   . Menorrhagia   . Nonverbal    PT AUTISTIC  . PONV (postoperative nausea and vomiting)    per mother "when wakes up pt can go from 0 to 60 and starts taking off cords and try to sit up to stand, better when mother and father are they when she wakes up"  . Repetitive movement    due to autism---  chronic picking of own skin and body    Surgical History Past Surgical History:  Procedure Laterality Date  . DENTAL RESTORATION/EXTRACTION WITH X-RAY    . DILATION AND CURETTAGE OF UTERUS N/Gabrielle [name] 04/07/2017   Procedure: DILATATION AND CURETTAGE;  Surgeon: 04/09/2017, MD;  [facility];  Service: Gynecology;  Laterality: N/Gabrielle [name];  . INTRAUTERINE DEVICE (IUD) INSERTION N/Gabrielle [name] 04/07/2017   Procedure: PLACEMENT OF 04/09/2017  IUD;  Surgeon: Rutha Bouchard, MD;  Location: [facility];  Service: Gynecology;  Laterality: N/Gabrielle [name];  . MASS EXCISION Left 06/15/2015   Procedure: EXCISION OF LEFT AXILLARY MASS/TETANUS INJECTION;  Surgeon: 06/17/2015, DO;  Location: [facility];  Service: Plastics;  Laterality: Left;    Family History family history includes Arthritis  in her paternal grandmother; Asthma in her mother; Cancer in her maternal grandfather; Diabetes in her maternal grandfather; Diverticulosis in her father; Endometriosis in her maternal grandmother; Heart disease in her maternal grandmother; Hyperlipidemia in her father and mother; Hypertension in her father and paternal grandfather.   Social History Social History   Social History Narrative   Patient lives with both parents. Gabrielle [name]  attends [employer].       Therapies: Not at the moment; Speech consultation at school.    Allergies Allergies  Allergen Reactions  . Cleaner [Ao-Sept Disinfection-Neutral] Hives    BRAND NAME - KABOOM    Medications Current Outpatient Medications on File Prior to Visit  Medication Sig Dispense Refill  . Ascorbic Acid (VITAMIN C PO) Take by mouth.    . Multiple Vitamin (MULTIVITAMIN) tablet Take 1 tablet by mouth daily.    . traZODone (DESYREL) 50 MG tablet Take 2 tablets (100 mg total) by mouth at bedtime. 60 tablet 3  . Ferrous Sulfate (IRON) 325 (65 Fe) MG TABS Take 65 mg by mouth. Vitron-C (Patient not taking: Reported on 11/10/2019)    . ibuprofen (ADVIL,MOTRIN) 200 MG tablet Take 200 mg by mouth every 6 (six) hours as needed. (Patient not taking: Reported on 11/10/2019)    . KYLEENA 19.5 MG IUD 1 Device by Intrauterine route once for 1 dose. 1 Intra Uterine Device 0  . Multiple Vitamins-Minerals (ZINC PO) Take by mouth. (Patient not taking: Reported on 11/10/2019)     No current facility-administered medications on file prior to visit.   The medication list was reviewed and reconciled. All changes or newly prescribed medications were explained.  Gabrielle [name] complete medication list was provided to the patient/caregiver.  Physical Exam Ht 5\' 6"  (1.676 m) Comment: reported  Wt 160 lb (72.6 kg) Comment: reported  BMI 25.82 kg/m  Facility age limit for growth percentiles is 20 years.  No exam data present Exam brief and limited due to virtual visit Gen: well appearing female Skin: No rash, No neurocutaneous stigmata. Hairloss visualized from mothers phone, significant patch missing, can not tell detail for broken hair strands.(see picture below)  HEENT: Normocephalic, no dysmorphic features, no conjunctival injection, nares patent, mucous membranes moist, oropharynx clear. Resp: normal work of breathing well perfused  Neurological Examination: MS: Awake,  alert, interactive with mother,  Does not address camera, does respond to mother's commands.  Vocalizes but does not communicate using words.   Cranial Nerves: face symmetric with full strength of facial muscles, hearing grossly intact.  Motor/Coordination- At least antigravity in all muscle groups. No abnormal movements. .  Gait: Normal gait. Paces.      Diagnosis:No diagnosis found.  Assessment and Plan EUSTACIA URBANEK is Gabrielle [name] 19 y.o. female with history of developmental delay and autism who I am seeing in follow-up. Patient recently had sedation for labwork that all came back normal except for mildly low VItamin D level.  Of note, prader-willi testing completed due to extreme appetite and also normal. Patient's sleep is much improved.  Behavior improved but not completely controlled with addition of lowdose abilify. Discussed options for medication management and decided on increasing abilify.     Increase Abilify to 10mg  for agitation  Continue clonidine BID for akathesia  Continue trazodone 100mg  at night for sleep  Agree with multivitamin with Vitamin D to improve level, but counseled that this is not deficiency and there is no indication of medical cause of heat intolerance, akathesia, or appetite.  Regarding skin, I suspect this is related to skin picking, however if it does not get better, recommend seeing PCP and/or referral to dermatology for further evaluation.   Mother in agreement for COVID shot.  I will reach out to COVID ambulatory bus to come to home.     Return in about 3 months (around 02/10/2020).  Lorenz Coaster MD MPH Neurology and Neurodevelopment [facility]  [address] [place], [place], [place] [address] Phone: [phone]   I spend 32 minutes on day of service on this patient including discussion with patient and family, coordination with other providers, and review of chart  By signing below, I, Dieudonne Garth Schlatter attest that this  documentation has been prepared under the direction of Lorenz Coaster, MD.    I, Lorenz Coaster, MD personally performed the services described in this documentation. All medical record entries made by the scribe were at my direction. I have reviewed the chart and agree that the record reflects my personal performance and is accurate and complete Electronically signed by Denyce Robert and Lorenz Coaster, MD

## 2019-11-10 NOTE — Patient Instructions (Signed)
Aripiprazole tablets What is this medicine? ARIPIPRAZOLE (ay ri PIP ray zole) is an atypical antipsychotic. It is used to treat schizophrenia and bipolar disorder, also known as manic-depression. It is also used to treat Tourette's disorder and some symptoms of autism. This medicine may also be used in combination with antidepressants to treat major depressive disorder. This medicine may be used for other purposes; ask your health care provider or pharmacist if you have questions. COMMON BRAND NAME(S): Abilify What should I tell my health care provider before I take this medicine? They need to know if you have any of these conditions:  dehydration  dementia  diabetes  heart disease  history of stroke  low blood counts, like low white cell, platelet, or red cell counts  Parkinson's disease  seizures  suicidal thoughts, plans, or attempt; a previous suicide attempt by you or a family member  an unusual or allergic reaction to aripiprazole, other medicines, foods, dyes, or preservatives  pregnant or trying to get pregnant  breast-feeding How should I use this medicine? Take this medicine by mouth with a glass of water. Follow the directions on the prescription label. You can take this medicine with or without food. Take your doses at regular intervals. Do not take your medicine more often than directed. Do not stop taking except on the advice of your doctor or health care professional. A special MedGuide will be given to you by the pharmacist with each prescription and refill. Be sure to read this information carefully each time. Talk to your pediatrician regarding the use of this medicine in children. While this drug may be prescribed for children as young as 6 years of age for selected conditions, precautions do apply. Overdosage: If you think you have taken too much of this medicine contact a poison control center or emergency room at once. NOTE: This medicine is only for you. Do  not share this medicine with others. What if I miss a dose? If you miss a dose, take it as soon as you can. If it is almost time for your next dose, take only that dose. Do not take double or extra doses. What may interact with this medicine? Do not take this medicine with any of the following medications:  brexpiprazole  cisapride  dronedarone  metoclopramide  pimozide  thioridazine This medicine may also interact with the following medications:  alcohol  carbamazepine  certain medicines for anxiety or sleep  certain medicines for blood pressure  certain medicines for fungal infections like ketoconazole, fluconazole, posaconazole, and itraconazole  clarithromycin  dofetilide  fluoxetine  other medicines that prolong the QT interval (cause an abnormal heart rhythm)  paroxetine  quinidine  rifampin  ziprasidone This list may not describe all possible interactions. Give your health care provider a list of all the medicines, herbs, non-prescription drugs, or dietary supplements you use. Also tell them if you smoke, drink alcohol, or use illegal drugs. Some items may interact with your medicine. What should I watch for while using this medicine? Visit your health care professional for regular checks on your progress. Tell your health care professional if symptoms do not start to get better or if they get worse. Do not stop taking except on your health care professional's advice. You may develop a severe reaction. Your health care professional will tell you how much medicine to take. Patients and their families should watch out for new or worsening depression or thoughts of suicide. Also watch out for sudden changes in feelings   such as feeling anxious, agitated, panicky, irritable, hostile, aggressive, impulsive, severely restless, overly excited and hyperactive, or not being able to sleep. If this happens, especially at the beginning of antidepressant treatment or after a  change in dose, call your health care professional. You may get dizzy or drowsy. Do not drive, use machinery, or do anything that needs mental alertness until you know how this medicine affects you. Do not stand or sit up quickly, especially if you are an older patient. This reduces the risk of dizzy or fainting spells. Alcohol may interfere with the effect of this medicine. Avoid alcoholic drinks. This drug can cause problems with controlling your body temperature. It can lower the response of your body to cold temperatures. If possible, stay indoors during cold weather. If you must go outdoors, wear warm clothes. It can also lower the response of your body to heat. Do not overheat. Do not over-exercise. Stay out of the sun when possible. If you must be in the sun, wear cool clothing. Drink plenty of water. If you have trouble controlling your body temperature, call your health care provider right away. This medicine may cause dry eyes and blurred vision. If you wear contact lenses, you may feel some discomfort. Lubricating drops may help. See your eye doctor if the problem does not go away or is severe. This medicine may increase blood sugar. Ask your health care provider if changes in diet or medicines are needed if you have diabetes. There are have been reports of increased sexual urges or other strong urges such as gambling while taking this medicine. If you experience any of these while taking this medicine, you should report this to your health care professional as soon as possible. What side effects may I notice from receiving this medicine? Side effects that you should report to your doctor or health care professional as soon as possible:  allergic reactions like skin rash, itching or hives, swelling of the face, lips, or tongue  breathing problems  confusion  fast, irregular heartbeat  fever or chills, sore throat  inability to keep still  males: prolonged or painful erection  new or  increased gambling urges, sexual urges, uncontrolled spending, binge or compulsive eating, or other urges  problems with balance, talking, walking  seizures  signs and symptoms of high blood sugar such as being more thirsty or hungry or having to urinate more than normal. You may also feel very tired or have blurry vision  signs and symptoms of low blood pressure like dizziness; feeling faint or lightheaded, falls; unusually weak or tired  signs and symptoms of neuroleptic malignant syndrome like confusion; fast or irregular heartbeat; high fever; increased sweating; stiff muscles  sudden numbness or weakness of the face, arm, or leg  suicidal thoughts or other mood changes  trouble swallowing  uncontrollable movements of the arms, face, head, mouth, neck, or upper body Side effects that usually do not require medical attention (report to your doctor or health care professional if they continue or are bothersome):  constipation  headache  nausea, vomiting  trouble sleeping  weight gain This list may not describe all possible side effects. Call your doctor for medical advice about side effects. You may report side effects to FDA at 1-800-FDA-1088. Where should I keep my medicine? Keep out of the reach of children. Store at room temperature between 15 and 30 degrees C (59 and 86 degrees F). Throw away any unused medicine after the expiration date. NOTE: This sheet   is a summary. It may not cover all possible information. If you have questions about this medicine, talk to your doctor, pharmacist, or health care provider.  2020 Elsevier/Gold Standard (2018-12-29 16:17:22)  

## 2019-11-19 MED ORDER — TRAZODONE HCL 50 MG PO TABS: 100.0000 mg | ORAL_TABLET | Freq: Every day | ORAL | 3 refills | Status: DC

## 2020-03-02 ENCOUNTER — Other Ambulatory Visit: Payer: Self-pay

## 2020-03-02 ENCOUNTER — Ambulatory Visit (INDEPENDENT_AMBULATORY_CARE_PROVIDER_SITE_OTHER): Payer: Medicaid Other | Admitting: Sports Medicine

## 2020-03-02 DIAGNOSIS — L989 Disorder of the skin and subcutaneous tissue, unspecified: Secondary | ICD-10-CM | POA: Diagnosis not present

## 2020-03-02 DIAGNOSIS — Q828 Other specified congenital malformations of skin: Secondary | ICD-10-CM

## 2020-03-02 DIAGNOSIS — F89 Unspecified disorder of psychological development: Secondary | ICD-10-CM

## 2020-03-02 DIAGNOSIS — M79672 Pain in left foot: Secondary | ICD-10-CM

## 2020-03-02 DIAGNOSIS — M79671 Pain in right foot: Secondary | ICD-10-CM | POA: Diagnosis not present

## 2020-03-02 DIAGNOSIS — F840 Autistic disorder: Secondary | ICD-10-CM | POA: Diagnosis not present

## 2020-03-02 MED ORDER — TRIAMCINOLONE ACETONIDE 0.5 % EX OINT
1.0000 | TOPICAL_OINTMENT | Freq: Two times a day (BID) | CUTANEOUS | 0 refills | Status: DC
Start: 2020-03-02 — End: 2020-10-03

## 2020-03-02 MED ORDER — SALICYLIC ACID 17 % EX GEL: CUTANEOUS | 0 refills | Status: DC

## 2020-03-02 NOTE — Progress Notes (Signed)
Subjective: Gabrielle [name] is a 19 y.o. female patient who presents to office for evaluation of Left foot wart/skin lesion. Patient is assisted by mom who reports that she has had the lesion on the left 1st toe for 10 years and it has not changed but has noticed the 5th mtpj lesion on left for 2 months. Patient's mom has not tried any treatment for this but uses kenalog ontiment for dry cracking skin and vaseline which helps. No other pedal complaints.   Review of Systems  All other systems reviewed and are negative.   Patient Active Problem List   Diagnosis Date Noted  . Polypharmacy 05/04/2018  . 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 Compulsive skin picking 03/04/2018  . Keratinous cyst 06/23/2015  . Autism 05/23/2015  . Developmental disability 03/03/2013  . Acne 06/04/2011    Current Outpatient Medications on File Prior to Visit  Medication Sig Dispense Refill  . ARIPiprazole (ABILIFY) 10 MG tablet Take 1 tablet (10 mg total) by mouth daily. 30 tablet 3  . Ascorbic Acid (VITAMIN C PO) Take by mouth.    . cloNIDine (CATAPRES) 0.1 MG tablet Take 1 tablet (0.1 mg total) by mouth 2 (two) times daily. 60 tablet 3  . Ferrous Sulfate (IRON) 325 (65 Fe) MG TABS Take 65 mg by mouth. Vitron-C (Patient not taking: Reported on 11/10/2019)    . ibuprofen (ADVIL,MOTRIN) 200 MG tablet Take 200 mg by mouth every 6 (six) hours as needed. (Patient not taking: Reported on 11/10/2019)    . KYLEENA 19.5 MG IUD 1 Device by Intrauterine route once for 1 dose. 1 Intra Uterine Device 0  . Multiple Vitamin (MULTIVITAMIN) tablet Take 1 tablet by mouth daily.    . Multiple Vitamins-Minerals (ZINC PO) Take by mouth. (Patient not taking: Reported on 11/10/2019)    . traZODone (DESYREL) 50 MG tablet Take 2 tablets (100 mg total) by mouth at bedtime. 60 tablet 3   No current facility-administered medications on file prior to visit.    Allergies  Allergen Reactions  .  Cleaner [Ao-Sept Disinfection-Neutral] Hives    BRAND NAME - KABOOM    Objective:  General: Alert and oriented x2 in no acute distress unable to sit due to Autism   Dermatology: Keratotic lesion present sub met 5 and hallux on left with skin lines transversing the lesion, all nails x 10 are well manicured.  Neurovascular status: Intact   Musculoskeletal: No noted symptomatic deformity, patient unable to sit still due to autism.  Assessment and Plan: Problem List Items Addressed This Visit      Other   Developmental disability   Autism    Other Visit Diagnoses    Benign skin lesion    -  Primary   Porokeratosis       Foot pain, bilateral         -Complete examination performed -Discussed treatment options -Refill triamcinolone to use as needed for dry skin however at this visit no acute issues noted at today's visit with dry skin -Rx Salicyclic acid gel to apply every other day to warts until resolved  -Encouraged daily skin emollients -Encouraged use of lysol to crocs -Patient to return to office as needed or sooner if condition worsens.  Landis Martins, DPM

## 2020-03-03 ENCOUNTER — Encounter (INDEPENDENT_AMBULATORY_CARE_PROVIDER_SITE_OTHER): Payer: Self-pay | Admitting: Pediatrics

## 2020-03-03 ENCOUNTER — Telehealth (INDEPENDENT_AMBULATORY_CARE_PROVIDER_SITE_OTHER): Payer: Medicaid Other | Admitting: Pediatrics

## 2020-03-03 VITALS — Ht 66.5 in | Wt 170.0 lb

## 2020-03-03 DIAGNOSIS — F424 Excoriation (skin-picking) disorder: Secondary | ICD-10-CM

## 2020-03-03 DIAGNOSIS — R632 Polyphagia: Secondary | ICD-10-CM | POA: Diagnosis not present

## 2020-03-03 DIAGNOSIS — G2571 Drug induced akathisia: Secondary | ICD-10-CM

## 2020-03-03 DIAGNOSIS — F809 Developmental disorder of speech and language, unspecified: Secondary | ICD-10-CM

## 2020-03-03 DIAGNOSIS — R451 Restlessness and agitation: Secondary | ICD-10-CM

## 2020-03-03 DIAGNOSIS — G2569 Other tics of organic origin: Secondary | ICD-10-CM

## 2020-03-03 DIAGNOSIS — Z8742 Personal history of other diseases of the female genital tract: Secondary | ICD-10-CM

## 2020-03-03 DIAGNOSIS — R635 Abnormal weight gain: Secondary | ICD-10-CM

## 2020-03-03 DIAGNOSIS — F840 Autistic disorder: Secondary | ICD-10-CM

## 2020-03-03 DIAGNOSIS — F89 Unspecified disorder of psychological development: Secondary | ICD-10-CM

## 2020-03-03 MED ORDER — ARIPIPRAZOLE 10 MG PO TABS: 10.0000 mg | ORAL_TABLET | Freq: Every day | ORAL | 5 refills | Status: DC

## 2020-03-03 MED ORDER — CLONIDINE HCL 0.1 MG PO TABS: 0.1000 mg | ORAL_TABLET | Freq: Two times a day (BID) | ORAL | 5 refills | Status: DC

## 2020-03-03 MED ORDER — TRAZODONE HCL 50 MG PO TABS: 100.0000 mg | ORAL_TABLET | Freq: Every day | ORAL | 5 refills | Status: DC

## 2020-03-03 MED ORDER — N-ACETYL-L-CYSTEINE 600 MG PO CAPS: 1800.0000 mg | ORAL_CAPSULE | Freq: Every day | ORAL | 5 refills | Status: DC

## 2020-03-03 NOTE — Progress Notes (Addendum)
Patient: Gabrielle [name] MRN: [id number] Sex: female DOB: 04/07/2000  Provider: Lorenz Coaster, MD Location of Care: [facility]  Note type: Routine follow-up  This is a Pediatric Specialist E-Visit follow up consult provided via Mychart video Gabrielle [name] and their parent/guardian consented to an E-Visit consult today.  Location of patient: Gabrielle [name] is at home Location of provider: Shaune Pascal is at home Patient was referred by Ronney Asters, MD   The following participants were involved in this E-Visit: Lorre Munroe, CMA      Lorenz Coaster, MD  History of Present Illness:  Gabrielle [name] is a 19 y.o. female with history of history of autism, developmental delay, tic disorder with compulsive picking, and akathesia of unknown etiology who I am seeing for routine follow-up. Patient was last seen on 11/10/19 where Abilify was increased to 10mg  and trazodone and clonidine were continued.  Since the last appointment,  patient has had no ED visits or hospital admissions.  Patient presents today with mother.   She reports the agitation in general has been much better on abilify.  She has gained 10 lbs since last appointment, but mother does not feel Gabrielle [name] is particularly more hungry, or at least not aggressive about eating. Still up a lot, but does sometimes sit, which is new.   Sleep is also going well, falls asleep without difficulty and generally stays asleep.  She is an early riser, but has always been.   Biggest concern is that she has been scratching again, pulling out hair.  Mother concerned she will cause infection again.    Mother does report that she is working on Gabrielle [name] with IT trainer.  She is doing really well with it and is ready to order on of her own.  Mother needs a order sent to SLP Amy Swaim.   Past Medical History Past Medical History:  Diagnosis Date  . Anemia   . Anxiety   . Autism  disorder    per mother severe autism, very social,  good receptive languange but nonverbal (can do some sign languange) , high tolerence to pain  . Dysmenorrhea   . Menorrhagia   . Nonverbal    PT AUTISTIC  . PONV (postoperative nausea and vomiting)    per mother "when wakes up pt can go from 0 to 60 and starts taking off cords and try to sit up to stand, better when mother and father are they when she wakes up"  . Repetitive movement    due to autism---  chronic picking of own skin and body    Surgical History Past Surgical History:  Procedure Laterality Date  . DENTAL RESTORATION/EXTRACTION WITH X-RAY    . DILATION AND CURETTAGE OF UTERUS N/A 04/07/2017   Procedure: DILATATION AND CURETTAGE;  Surgeon: 04/09/2017, MD;  Location: [facility];  Service: Gynecology;  Laterality: N/A;  . INTRAUTERINE DEVICE (IUD) INSERTION N/A 04/07/2017   Procedure: PLACEMENT OF 04/09/2017  IUD;  Surgeon: Rutha Bouchard, MD;  Location: [facility];  Service: Gynecology;  Laterality: N/A;  . MASS EXCISION Left 06/15/2015   Procedure: EXCISION OF LEFT AXILLARY MASS/TETANUS INJECTION;  Surgeon: 06/17/2015, DO;  Location: [facility];  Service: Plastics;  Laterality: Left;    Family History family history includes Arthritis in her paternal grandmother; Asthma in her mother; Cancer in her maternal grandfather; Diabetes in her maternal grandfather; Diverticulosis in her father; Endometriosis  in her maternal grandmother; Heart disease in her maternal grandmother; Hyperlipidemia in her father and mother; Hypertension in her father and paternal grandfather.   Social History Social History   Social History Narrative   Patient lives with both parents. Katianne attends [employer].       Therapies: Not at the moment; Speech consultation at school.    Allergies Allergies  Allergen Reactions  . Cleaner [Ao-Sept Disinfection-Neutral] Hives     BRAND NAME - KABOOM    Medications Current Outpatient Medications on File Prior to Visit  Medication Sig Dispense Refill  . Ascorbic Acid (VITAMIN C PO) Take by mouth.    Marland Kitchen KYLEENA 19.5 MG IUD 1 Device by Intrauterine route once for 1 dose. 1 Intra Uterine Device 0  . Multiple Vitamin (MULTIVITAMIN) tablet Take 1 tablet by mouth daily.    . salicylic acid 17 % gel Apply topically every other day. To plantar wart at left foot x 2 14 g 0  . Ferrous Sulfate (IRON) 325 (65 Fe) MG TABS Take 65 mg by mouth. Vitron-C (Patient not taking: Reported on 03/03/2020)    . ibuprofen (ADVIL,MOTRIN) 200 MG tablet Take 200 mg by mouth every 6 (six) hours as needed.    . triamcinolone ointment (KENALOG) 0.5 % Apply 1 application topically 2 (two) times daily. (Patient not taking: Reported on 03/03/2020) 30 g 0   No current facility-administered medications on file prior to visit.   The medication list was reviewed and reconciled. All changes or newly prescribed medications were explained.  A complete medication list was provided to the patient/caregiver.  Physical Exam Ht 5' 6.5" (1.689 m) Comment: reported  Wt 170 lb (77.1 kg) Comment: reported  BMI 27.03 kg/m  Facility age limit for growth percentiles is 20 years.  No exam data present Vitals and exam limited due to virtual visit.  Gabrielle [name] briefly seen in video, no distress.  Normal work of breathing.  Normal gait.  Verbalizes but no words.    Diagnosis: 1. Akathisia   2. Compulsive skin picking   3. Overeating   4. Developmental disability   5. Agitation   6. Organic tic disorder   7. History of heavy vaginal bleeding   8. Weight gain     Assessment and Plan Gabrielle [name] is a 19 y.o. female with history of history of autism, developmental delay, tic disorder with compulsive picking, and akathesia of unknown etiology who I am seeing in follow-up. Symptoms ar overall improved, however still picking and itching.  I advised trying N  acetyl-cysteine.  Advised this is not a supplement I have prescribed before, however it is recommended for itching and has even been shown to improve behavior as well.  Really no side effects.  Mother in agreement to try.  Will keep other medications unchanged.    N-acetyl cysteine ordered today, 1800mg  daily  Continue Abilify 10mg  daily  Continue Clonidine 0.1mg  BID  Continue trazodone 100mg  nightly  DME order written for aug comm device, will email to SLP  Return in about 3 months (around 06/01/2020).  MD MPH Neurology and Neurodevelopment [facility] Child Neurology  [address] [place], [place], [address] [place] Phone: [phone]  By signing below, I, [place] attest that this documentation has been prepared under the direction of [id number], MD.    I, [phone], MD personally performed the services described in this documentation. All medical record entries made by the scribe were at my  direction. I have reviewed the chart and agree that the record reflects my personal performance and is accurate and complete Electronically signed by Denyce Robert and Lorenz Coaster, MD

## 2020-03-03 NOTE — Patient Instructions (Signed)
Start N- acetyl cysteinefor picking- call and let me know how it goes  Continue all other medications Letter written for Gabrielle [name] to have an  Paramedic device. Referral to [facility] internal medicine

## 2020-04-24 ENCOUNTER — Encounter (INDEPENDENT_AMBULATORY_CARE_PROVIDER_SITE_OTHER): Payer: Self-pay | Admitting: Pediatrics

## 2020-04-24 NOTE — Addendum Note (Signed)
Addended by: Margurite Auerbach on: 04/24/2020 08:55 AM   Modules accepted: Orders

## 2020-04-24 NOTE — Addendum Note (Signed)
Addended by: Margurite Auerbach on: 04/24/2020 08:13 AM   Modules accepted: Level of Service

## 2020-06-27 ENCOUNTER — Encounter (INDEPENDENT_AMBULATORY_CARE_PROVIDER_SITE_OTHER): Payer: Self-pay | Admitting: Dietician

## 2020-08-25 ENCOUNTER — Telehealth (INDEPENDENT_AMBULATORY_CARE_PROVIDER_SITE_OTHER): Payer: Self-pay | Admitting: Pediatrics

## 2020-08-25 NOTE — Telephone Encounter (Signed)
  Who's calling (name and relationship to patient) :  [name] ( mom) Best contact number: [phone]  Provider they see: Dr. Artis Flock   Reason for call: Mom calling this morning needing several things  1 patient is scheduled for in person appt on first available 7-21 with Dr. Artis Flock   2 Patient is attending a day camp and mom needs a med Auth form for the following medications Acetylcysteine patient needs to take it at 2:00 pm while at the camp she also will be taking the Ibuprofen 200 mg mom said she takes 2 at the beginning of the day for menstrual cramps and then 1 as needed throughout the day. Mom needs med auth emailed to her. ROI sent via email waiting for response her email has been verified as [email or web address]  3 Patient needs a refill on her Abilify    4 Mom requesting a virtual appt for the upcoming appt patient has not been seen in person since 2020 so I told mom I would ask and get back to her about it      PRESCRIPTION REFILL ONLY  Name of prescription: Abilify  Pharmacy: [facility] [address]

## 2020-08-28 MED ORDER — ARIPIPRAZOLE 10 MG PO TABS: 10.0000 mg | ORAL_TABLET | Freq: Every day | ORAL | 0 refills | Status: DC

## 2020-08-28 NOTE — Telephone Encounter (Signed)
ROI on file for permission to email med auth form  to parent

## 2020-08-28 NOTE — Telephone Encounter (Signed)
Spoke with mom and she informs Gabrielle [name] is going to a day camp so she needs something on letter head stating when Gabrielle [name] is to take her medications. There is a release on file for that to be scanned and sent to an e-mail address.   Mom aware appointment will be an in patient appointment.

## 2020-08-28 NOTE — Telephone Encounter (Signed)
The letter for camp was emailed to [facility] as requested. TG

## 2020-08-28 NOTE — Telephone Encounter (Signed)
I sent in a refill for Abilify. For the med auths for camp - does Mom have a form or is it the [employer] med form?   For the appointment - she needs in person, not virtual visit since she hasn't been seen since 2020.  TG

## 2020-08-29 ENCOUNTER — Telehealth (INDEPENDENT_AMBULATORY_CARE_PROVIDER_SITE_OTHER): Payer: Self-pay | Admitting: Family

## 2020-08-29 NOTE — Telephone Encounter (Signed)
I called and clarified information. I will email the updated letter to Mom as requested. TG

## 2020-08-29 NOTE — Telephone Encounter (Signed)
Who's calling (name and relationship to patient) : Gabrielle [name] mom   Best contact number: [phone]  Provider they see: Elveria Rising  Reason for call: Mom states that she got a letter for medication use but there is an error. The amount is listed incorrectly and mom would like this fixed.  States that it should be acetylcysteine 600 ml at 2 pm Its written for summer camp but the letter should be for year round and should state that its for day program.  Mom states that people call medication [facility] and mom thinks this should be included too.   Call ID:      PRESCRIPTION REFILL ONLY  Name of prescription:  Pharmacy:

## 2020-10-03 ENCOUNTER — Other Ambulatory Visit: Payer: Self-pay

## 2020-10-03 ENCOUNTER — Ambulatory Visit
Admission: RE | Admit: 2020-10-03 | Discharge: 2020-10-03 | Disposition: A | Discharge status: Home / Self Care | Payer: Medicaid Other | Source: Ambulatory Visit | Attending: Emergency Medicine | Admitting: Emergency Medicine

## 2020-10-03 VITALS — HR 121 | Temp 98.9°F | Resp 24

## 2020-10-03 DIAGNOSIS — L600 Ingrowing nail: Secondary | ICD-10-CM | POA: Diagnosis not present

## 2020-10-03 MED ORDER — CEPHALEXIN 500 MG PO CAPS: 1000.0000 mg | ORAL_CAPSULE | Freq: Two times a day (BID) | ORAL | 0 refills | Status: AC

## 2020-10-03 MED ORDER — IBUPROFEN 600 MG PO TABS
600.0000 mg | ORAL_TABLET | Freq: Four times a day (QID) | ORAL | 0 refills | Status: DC | PRN
Start: 2020-10-03 — End: 2022-04-18

## 2020-10-03 MED ORDER — HIBICLENS 4 % EX LIQD: Freq: Every day | CUTANEOUS | 0 refills | Status: DC | PRN

## 2020-10-03 MED ORDER — MUPIROCIN 2 % EX OINT: 1.0000 "application " | TOPICAL_OINTMENT | Freq: Two times a day (BID) | CUTANEOUS | 0 refills | Status: AC

## 2020-10-03 NOTE — Discharge Instructions (Addendum)
Do warm soaks, you can use the Hibiclens which is an antibacterial soap.  Keflex twice a day, 1000 milligrams of Tylenol combined with 600 mg of ibuprofen 3-4 times a day as needed for pain, Bactroban.  Follow-up with podiatry and see if they have accommodations for her.  If he gets worse, fevers, or for any concerns, go to the emergency department

## 2020-10-03 NOTE — ED Triage Notes (Addendum)
Pt here with great toe infection x 1 week. Toe is red and swollen and seems to have dried up drainage at site. Pt is nonverbal autistic and will not let anyone touch it. Unable to obtain BP due to pacing.

## 2020-10-03 NOTE — ED Provider Notes (Signed)
HPI  SUBJECTIVE:  Gabrielle [name] is a 20 y.o. female who presents with left first toe swelling, erythema along the nail folds for the past 2 weeks.  Mother notes serosanguineous and purulent drainage.  She states that the patient will not let her touch her toe, that it appears painful.  Pt has been taking ibuprofen and mother has been applying Bactroban with improvement in her symptoms.  She has also tried peroxide and warm soaks.  Symptoms are worse with palpation, wearing tight socks.  No fevers, altered mental status, patient is eating and sleeping well.  She is not limping.  No antipyretic in the past 6 hours.  Past medical history of autism, is nonverbal.  No history of MRSA, diabetes, chronic kidney disease.  LMP: Now.  PMD: [facility].  Patient is nonverbal, all history obtained from parents.  Past Medical History:  Diagnosis Date   Anemia    Anxiety    Autism disorder    per mother severe autism, very social,  good receptive languange but nonverbal (can do some sign languange) , high tolerence to pain   Dysmenorrhea    Menorrhagia    Nonverbal    PT AUTISTIC   PONV (postoperative nausea and vomiting)    per mother "when wakes up pt can go from 0 to 60 and starts taking off cords and try to sit up to stand, better when mother and father are they when she wakes up"   Repetitive movement    due to autism---  chronic picking of own skin and body    Past Surgical History:  Procedure Laterality Date   DENTAL RESTORATION/EXTRACTION WITH X-RAY     DILATION AND CURETTAGE OF UTERUS N/A 04/07/2017   Procedure: DILATATION AND CURETTAGE;  Surgeon: Jerene Bears, MD;  Location: [facility];  Service: Gynecology;  Laterality: N/A;   INTRAUTERINE DEVICE (IUD) INSERTION N/A 04/07/2017   Procedure: PLACEMENT OF Rutha Bouchard  IUD;  Surgeon: Jerene Bears, MD;  Location: [facility];  Service: Gynecology;  Laterality: N/A;   MASS EXCISION Left 06/15/2015    Procedure: EXCISION OF LEFT AXILLARY MASS/TETANUS INJECTION;  Surgeon: Peggye Form, DO;  Location: [facility];  Service: Plastics;  Laterality: Left;    Family History  Problem Relation Age of Onset   Heart disease Maternal Grandmother    Endometriosis Maternal Grandmother    Diabetes Maternal Grandfather    Cancer Maternal Grandfather        Skin Cancer   Asthma Mother    Hyperlipidemia Mother    Hypertension Paternal Grandfather    Hypertension Father    Hyperlipidemia Father    Diverticulosis Father    Arthritis Paternal Grandmother     Social History   Tobacco Use   Smoking status: Never   Smokeless tobacco: Never  Vaping Use   Vaping Use: Never used  Substance Use Topics   Alcohol use: No   Drug use: No    No current facility-administered medications for this encounter.  Current Outpatient Medications:    cephALEXin (KEFLEX) 500 MG capsule, Take 2 capsules (1,000 mg total) by mouth 2 (two) times daily for 7 days., Disp: 28 capsule, Rfl: 0   chlorhexidine (HIBICLENS) 4 % external liquid, Apply topically daily as needed. Dilute 10-15 mL in water, Use daily when bathing for 1-2 weeks, Disp: 120 mL, Rfl: 0   ibuprofen (ADVIL) 600 MG tablet, Take 1 tablet (600 mg total) by mouth every  6 (six) hours as needed., Disp: 30 tablet, Rfl: 0   mupirocin ointment (BACTROBAN) 2 %, Apply 1 application topically 2 (two) times daily., Disp: 22 g, Rfl: 0   Acetylcysteine (N-ACETYL-L-CYSTEINE) 600 MG CAPS, Take 1,800 mg by mouth daily., Disp: 90 capsule, Rfl: 5   ARIPiprazole (ABILIFY) 10 MG tablet, Take 1 tablet (10 mg total) by mouth daily., Disp: 30 tablet, Rfl: 0   Ascorbic Acid (VITAMIN C PO), Take by mouth., Disp: , Rfl:    cloNIDine (CATAPRES) 0.1 MG tablet, Take 1 tablet (0.1 mg total) by mouth 2 (two) times daily., Disp: 60 tablet, Rfl: 5   Ferrous Sulfate (IRON) 325 (65 Fe) MG TABS, Take 65 mg by mouth. Vitron-C (Patient not taking: Reported on 03/03/2020),  Disp: , Rfl:    KYLEENA 19.5 MG IUD, 1 Device by Intrauterine route once for 1 dose., Disp: 1 Intra Uterine Device, Rfl: 0   Multiple Vitamin (MULTIVITAMIN) tablet, Take 1 tablet by mouth daily., Disp: , Rfl:    salicylic acid 17 % gel, Apply topically every other day. To plantar wart at left foot x 2, Disp: 14 g, Rfl: 0   traZODone (DESYREL) 50 MG tablet, Take 2 tablets (100 mg total) by mouth at bedtime., Disp: 60 tablet, Rfl: 5  Allergies  Allergen Reactions   Cleaner [Ao-Sept Disinfection-Neutral] Hives    BRAND NAME - KABOOM     ROS  As noted in HPI.   Physical Exam  Pulse (!) 121 Comment: Pt agitated  Temp 98.9 F (37.2 C) (Oral)   Resp (!) 24   SpO2 100%   Constitutional: Well developed, well nourished, agitated, pacing.  Will not let me touch her toe. Eyes:  EOMI, conjunctiva normal bilaterally HENT: Normocephalic, atraumatic,mucus membranes moist Respiratory: Normal inspiratory effort Cardiovascular: Normal rate GI: nondistended skin: No rash, skin intact Musculoskeletal: Erythema medial left first toe with large scab, positive swelling along the nail fold with serosanguineous drainage.   Neurologic: Alert & oriented x 3, no focal neuro deficits Psychiatric: Speech and behavior appropriate   ED Course   Medications - No data to display  No orders of the defined types were placed in this encounter.   No results found for this or any previous visit (from the past 24 hour(s)). No results found.  ED Clinical Impression  1. Ingrown toenail with infection      ED Assessment/Plan  Attempted to clean toe in order to obtain culture.  However patient will not let me examine her.  will try some Keflex 1000 mg p.o. twice daily for 7 days, continue Bactroban, warm soaks, advised parents to try doing cotton underneath her nail.  Follow-up with PMD if its not getting any better or with podiatry.  They are to call podiatry and see if they have accommodations for  autistic children.  If it is getting worse, she will need to go to the ED for sedation and nail removal.  Will write note for daycare that she can use Bactroban and take Tylenol and ibuprofen.  Discussed MDM, treatment plan, and plan for follow-up with patient. Discussed sn/sx that should prompt return to the ED. patient agrees with plan.   Meds ordered this encounter  Medications   cephALEXin (KEFLEX) 500 MG capsule    Sig: Take 2 capsules (1,000 mg total) by mouth 2 (two) times daily for 7 days.    Dispense:  28 capsule    Refill:  0   chlorhexidine (HIBICLENS) 4 % external liquid  Sig: Apply topically daily as needed. Dilute 10-15 mL in water, Use daily when bathing for 1-2 weeks    Dispense:  120 mL    Refill:  0   ibuprofen (ADVIL) 600 MG tablet    Sig: Take 1 tablet (600 mg total) by mouth every 6 (six) hours as needed.    Dispense:  30 tablet    Refill:  0   mupirocin ointment (BACTROBAN) 2 %    Sig: Apply 1 application topically 2 (two) times daily.    Dispense:  22 g    Refill:  0      *This clinic note was created using Scientist, clinical (histocompatibility and immunogenetics). Therefore, there may be occasional mistakes despite careful proofreading.  ?    Domenick Gong, MD 10/05/20 [phone]

## 2020-10-04 ENCOUNTER — Encounter (INDEPENDENT_AMBULATORY_CARE_PROVIDER_SITE_OTHER): Payer: Self-pay | Admitting: Pediatrics

## 2020-10-04 ENCOUNTER — Telehealth (INDEPENDENT_AMBULATORY_CARE_PROVIDER_SITE_OTHER): Payer: Self-pay | Admitting: Pediatrics

## 2020-10-04 ENCOUNTER — Ambulatory Visit (INDEPENDENT_AMBULATORY_CARE_PROVIDER_SITE_OTHER): Payer: Medicaid Other | Admitting: Pediatrics

## 2020-10-04 VITALS — BP 114/68 | HR 104 | Wt 180.6 lb

## 2020-10-04 DIAGNOSIS — F424 Excoriation (skin-picking) disorder: Secondary | ICD-10-CM | POA: Diagnosis not present

## 2020-10-04 DIAGNOSIS — Z8742 Personal history of other diseases of the female genital tract: Secondary | ICD-10-CM | POA: Diagnosis not present

## 2020-10-04 DIAGNOSIS — G2571 Drug induced akathisia: Secondary | ICD-10-CM

## 2020-10-04 DIAGNOSIS — F840 Autistic disorder: Secondary | ICD-10-CM

## 2020-10-04 DIAGNOSIS — R632 Polyphagia: Secondary | ICD-10-CM

## 2020-10-04 DIAGNOSIS — F89 Unspecified disorder of psychological development: Secondary | ICD-10-CM | POA: Diagnosis not present

## 2020-10-04 DIAGNOSIS — R451 Restlessness and agitation: Secondary | ICD-10-CM

## 2020-10-04 MED ORDER — CLONIDINE HCL 0.1 MG PO TABS: 0.1000 mg | ORAL_TABLET | Freq: Two times a day (BID) | ORAL | 5 refills | Status: DC

## 2020-10-04 MED ORDER — N-ACETYL-L-CYSTEINE 600 MG PO CAPS: 1200.0000 mg | ORAL_CAPSULE | Freq: Two times a day (BID) | ORAL | 5 refills | Status: DC

## 2020-10-04 MED ORDER — ARIPIPRAZOLE 10 MG PO TABS: 10.0000 mg | ORAL_TABLET | Freq: Every day | ORAL | 5 refills | Status: DC

## 2020-10-04 MED ORDER — TRAZODONE HCL 50 MG PO TABS: 100.0000 mg | ORAL_TABLET | Freq: Every day | ORAL | 5 refills | Status: DC

## 2020-10-04 NOTE — Telephone Encounter (Signed)
  Who's calling (name and relationship to patient) : Jeanella Anton from [facility]  Best contact number: [phone]  Provider they see: Dr. Artis Flock  Reason for call: [facility] needs a medication d/c order.    PRESCRIPTION REFILL ONLY  Name of prescription:  Pharmacy:

## 2020-10-04 NOTE — Patient Instructions (Signed)
Refills on all prescriptions Increas N acetyl-cysteine to 2 tablets twice daily (2400mg  daily) for picking Talk to her new primary care doctor about metformin for weight gain related to abilify

## 2020-10-04 NOTE — Telephone Encounter (Signed)
I think this may be for N-Acetyl cysteine, we switched it to morning and night dosing to make it easier.  Can you call to confirm, and get their fax number?  I will write a letter tomorrow and we can fax it directly.   Lorenz Coaster MD MPH

## 2020-10-04 NOTE — Progress Notes (Addendum)
Patient: Gabrielle [name] MRN: [id number] Sex: female DOB: Nov 23, 2000  Provider: Lorenz Coaster, MD Location of Care: [facility] - Child Neurology  Note type: Routine follow-up  History of Present Illness:  Gabrielle [name] is a 20 y.o. female with history of autism, developmental delay, tic disorder with compulsive picking, and akathesia of unknown etiology who I am seeing for routine follow-up. Patient was last seen on 03/03/20 where I started  N acetyl-cysteine for picking and itching. Abilify, clonidine, and trazodone were unchanged.  Since the last appointment, patient was seen yesterday in the ED  for a toe infection.   Patient presents today with mother.      Mom started NAC, but difficulty getting in 3 doses daily. She is still doing some picking, but it has improved.  Aggression much improved, despite changes to her schedule.   Agitation still happens sometimes, still related to periods.  Treat with ibuporfen and tylenol when she starts periods, this helps.  Extended family especially feel that she is doing better. No longer aggressive in the car.    Gabrielle [name] has [name] and is now at [employer].  They are working to transition to [facility] family medicine. Mother feels she will likely need dental work, and will need a new IUD soon.  These will be good times for blood draws given her continued medications that require monitoring.   Mother report she is still working on IT trainer with Human resources officer.  She is doing really well with it and is ready to order on of her own.    Patient history: Mother has guardianship   Patient has previously had severe menstruation.  Has had D&C for thick endometrium.  Now improved, has some breakthrough bleeding but no regular periods.   They have previously seen Dr. Sharene Skeans to rule out seizure activity at age 14. She had normal EEG at that time. She has not had known seizures to this point. She  previously spoke, had 50 words and short phrases at 15 months, but then started to lose language by age 72 months, and is now non-verbal. She was diagnosed with ASD at age 73 months. She lost receptive language as well, but upon entering therapies did regain this by age 1.    Ernest has [employer]-IDD. Mother has staff during the day to help with Gabrielle [name], 8am-5pm or 8am-8pm.  On weekends it's just family.  Prior medications: Neurontin, oxcarbazepine, Lamotrigine, Celexa, Lexapro. Fluvoxamine, buspirone, focalin, zoloft, hydroxizine.  Diazepam, Clonazepam, Midazolam, Lorazepam, Requip.   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  Did not test parents.  Food and chemical sensitivity testing showed severe intolerance to honey and scallops. Mild reaction to aspartame and artificial colors. Otherwise no major concerns.  Toxicology normal.  Organic acids, amino acids testing reviewed and copies, these are not standard panels but none appear extreme.  Mother not particularly interested in further genetics testing.   Past Medical History Past Medical History:  Diagnosis Date   Anemia    Anxiety    Autism disorder    per mother severe autism, very social,  good receptive languange but nonverbal (can do some sign languange) , high tolerence to pain   Dysmenorrhea    Menorrhagia    Nonverbal    PT AUTISTIC   PONV (postoperative nausea and vomiting)    per mother "when wakes up pt can go from 0 to 60 and starts taking off cords and try to sit up to  stand, better when mother and father are they when she wakes up"   Repetitive movement    due to autism---  chronic picking of own skin and body    Surgical History Past Surgical History:  Procedure Laterality Date   DENTAL RESTORATION/EXTRACTION WITH X-RAY     DILATION AND CURETTAGE OF UTERUS N/A 04/07/2017   Procedure: DILATATION AND CURETTAGE;  Surgeon: Jerene Bears, MD;  Location: [facility];  Service:  Gynecology;  Laterality: N/A;   INTRAUTERINE DEVICE (IUD) INSERTION N/A 04/07/2017   Procedure: PLACEMENT OF Rutha Bouchard  IUD;  Surgeon: Jerene Bears, MD;  Location: [facility];  Service: Gynecology;  Laterality: N/A;   MASS EXCISION Left 06/15/2015   Procedure: EXCISION OF LEFT AXILLARY MASS/TETANUS INJECTION;  Surgeon: Peggye Form, DO;  Location: [facility];  Service: Plastics;  Laterality: Left;    Family History family history includes Arthritis in her paternal grandmother; Asthma in her mother; Cancer in her maternal grandfather; Diabetes in her maternal grandfather; Diverticulosis in her father; Endometriosis in her maternal grandmother; Heart disease in her maternal grandmother; Hyperlipidemia in her father and mother; Hypertension in her father and paternal grandfather.   Social History Social History   Social History Narrative   Patient lives with both parents. Gabrielle [name]. She is now at [employer]       Therapies: Not at the moment.     Allergies Allergies  Allergen Reactions   Cleaner [Ao-Sept Disinfection-Neutral] Hives    BRAND NAME - KABOOM    Medications Current Outpatient Medications on File Prior to Visit  Medication Sig Dispense Refill   Ascorbic Acid (VITAMIN C PO) Take by mouth.     chlorhexidine (HIBICLENS) 4 % external liquid Apply topically daily as needed. Dilute 10-15 mL in water, Use daily when bathing for 1-2 weeks 120 mL 0   ibuprofen (ADVIL) 600 MG tablet Take 1 tablet (600 mg total) by mouth every 6 (six) hours as needed. 30 tablet 0   KYLEENA 19.5 MG IUD 1 Device by Intrauterine route once for 1 dose. 1 Intra Uterine Device 0   Multiple Vitamin (MULTIVITAMIN) tablet Take 1 tablet by mouth daily.     mupirocin ointment (BACTROBAN) 2 % Apply 1 application topically 2 (two) times daily. 22 g 0   salicylic acid 17 % gel Apply topically every other day. To plantar wart at left foot  x 2 14 g 0   Ferrous Sulfate (IRON) 325 (65 Fe) MG TABS Take 65 mg by mouth. Vitron-C (Patient not taking: No sig reported)     No current facility-administered medications on file prior to visit.   The medication list was reviewed and reconciled. All changes or newly prescribed medications were explained.  A complete medication list was provided to the patient/caregiver.  Physical Exam BP 114/68   Pulse (!) 104   Wt 180 lb 9.6 oz (81.9 kg)   LMP 10/02/2020 Comment: Cycles are umpredictable  BMI 28.71 kg/m  Facility age limit for growth percentiles is 20 years.  No results found. Gen: well appearing, neuroaffective, active young lately Skin: No rash, No neurocutaneous stigmata. HEENT: Normocephalic, no dysmorphic features, no conjunctival injection, nares patent, mucous membranes moist, oropharynx clear. Hair missing on the entire lower half of her head due to picking.  Neck: Supple, no meningismus. No focal tenderness. Resp: Clear to auscultation bilaterally CV: Regular rate, normal S1/S2, no murmurs, no rubs Abd: BS present,  abdomen soft, non-tender, non-distended. No hepatosplenomegaly or mass Ext: Warm and well-perfused. No deformities, no muscle wasting, ROM full.  Neurological Examination: MS: Awake, alert, interactive. Poor eye contact but does attend to what is going on in the room.  Poor attention, mostly plays by herself or seeks stimulus.  Non-verbal but makes vocalizations.  Cranial Nerves:Unable to check pupils. EOM normal, no nystagmus; no ptsosis, face symmetric with full strength of facial muscles, hearing intact grossly.  Motor-Unable to check tone. At least 4/5 strength in all muscle groups Reflexes- Reflexes 2+ and symmetric in the biceps, triceps, patellar and achilles tendon. Plantar responses flexor bilaterally, no clonus noted Sensation: Intact to light touch throughout.   Coordination: No dysmetria with reaching for objects    Diagnosis: 1. Akathisia   2.  Developmental disability   3. History of heavy vaginal bleeding   4. Compulsive skin picking   5. Agitation   6. Overeating   7. Autism      Assessment and Plan Gabrielle [name] is a 20 y.o. female with history of autism of unknown origin with resulting intellectual disability, compulsive skin picking, and akathesia who I am seeing in follow-up. Her previous agitation is much improved, and she seems less anxious.  She is also no longer seeming overheated, which is reassuring. 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 Mother is worried for weight gain.  While she is overweight, she has not had significant weight gain recently and I am happy with her current body habitus, given the circumstances.  I did encourage mother to follow-up with her other providers and especially to establish with a PCP.  I am happy to help with this transition however I can, and plan to continue to follow Gabrielle [name] despite her now being an adult.  However I do not feel comfortable managing her general medical problems outside of her autism diagnosis.   Refill Abilify, Clonidine and Trazodone at current doses.  Recommend increase N acetyl-cysteine dose, but can reduce the frequency for improved compliance.  New prescription for  2 tablets twice daily (2400mg  daily) for picking. Discussed augmentative communication device with patient and mother. Patient will functionally benefit.   Letter sent to new school to discontinue school dose.  Advised mother to talk her new primary care doctor about metformin for weight gain related to abilify Mother to call if sedation is planned with other providers, I will write screening labs for medications.    Return in about 6 months (around 04/06/2021).  04/08/2021 MD MPH Neurology and Neurodevelopment [facility] Child Neurology  [address] [place], [place], [place] Phone: [phone]

## 2020-10-05 ENCOUNTER — Encounter (INDEPENDENT_AMBULATORY_CARE_PROVIDER_SITE_OTHER): Payer: Self-pay

## 2020-10-05 NOTE — Telephone Encounter (Signed)
Received call back from [employer]. They inform the time change of the medication will make it so mom is giving medicine, not [facility] so they need a D/C for N-Acetyl Cysteine. This can be faxed to [phone]

## 2020-10-05 NOTE — Telephone Encounter (Signed)
Left voicemail to call back for confirmation

## 2020-10-05 NOTE — Telephone Encounter (Signed)
I put the letter for [facility] in My Chart and sent a note that it was there.

## 2020-10-05 NOTE — Telephone Encounter (Signed)
Ellie will do this for you this afternoon.

## 2020-10-12 ENCOUNTER — Encounter (INDEPENDENT_AMBULATORY_CARE_PROVIDER_SITE_OTHER): Payer: Self-pay | Admitting: Pediatrics

## 2020-10-27 ENCOUNTER — Other Ambulatory Visit: Payer: Self-pay

## 2020-10-27 ENCOUNTER — Ambulatory Visit (INDEPENDENT_AMBULATORY_CARE_PROVIDER_SITE_OTHER): Payer: Medicaid Other | Admitting: Family Medicine

## 2020-10-27 ENCOUNTER — Encounter: Payer: Self-pay | Admitting: Family Medicine

## 2020-10-27 VITALS — Ht 66.5 in | Wt 182.0 lb

## 2020-10-27 DIAGNOSIS — F840 Autistic disorder: Secondary | ICD-10-CM | POA: Diagnosis not present

## 2020-10-27 DIAGNOSIS — Z7689 Persons encountering health services in other specified circumstances: Secondary | ICD-10-CM | POA: Diagnosis not present

## 2020-10-27 DIAGNOSIS — L03039 Cellulitis of unspecified toe: Secondary | ICD-10-CM | POA: Diagnosis not present

## 2020-10-27 DIAGNOSIS — Z6828 Body mass index (BMI) 28.0-28.9, adult: Secondary | ICD-10-CM

## 2020-10-27 DIAGNOSIS — L600 Ingrowing nail: Secondary | ICD-10-CM

## 2020-10-27 MED ORDER — DOXYCYCLINE HYCLATE 100 MG PO CAPS
100.0000 mg | ORAL_CAPSULE | Freq: Two times a day (BID) | ORAL | 0 refills | Status: AC
Start: 2020-10-27 — End: 2020-11-03

## 2020-10-27 NOTE — Patient Instructions (Addendum)
It was great to meet you!  Things we discussed at today's visit: - Gabrielle [name] should take another round of antibiotics for her toe. I will send doxycycline (twice daily for 1 week) to your pharmacy. This can cause some upset stomach so it's best to take with food and water. It also causes extreme sensitivity to the sun.  - Please call the podiatrist for removal of the ingrown portion of that toenail. While she's sedated, they can obtain the blood work Dr. Artis Flock ordered  Take care and seek immediate care sooner if you develop any concerns.  Dr. [facility]

## 2020-10-27 NOTE — Progress Notes (Signed)
   Subjective:   CC: Establish care  HPI:  Gabrielle [name] is a 20 y.o. female who presents today to establish care.  Initial concerns:  Ingrown toenail. Has been a problem for several weeks. Had a course of keflex a few weeks ago. Now appears somewhat improved but still red, painful, she won't let Mom near it.  Past medical history: Autism, developmental delay, tic disorder, compulsive picking, akathisia, painful periods (Kyleena in place), nonverbal. Per Mom, patient needs sedation for all medical interventions (bloodwork, simple procedures, etc)  Past surgical history: wisdom teeth removal  Current medications: NAC, abilify, clonidine, trazodone, multivitamin, mupirocin ointment  Family history: Mom hyperlipidemia and hypothyroidism, PGF diabetes, MGM with MI and CABG  Social history: lives with Mom and Dad, goes to [employer] day program during the day   Objective:  Ht 5' 6.5" (1.689 m)   Wt 182 lb (82.6 kg)   LMP 10/02/2020 Comment: Cycles are umpredictable  BMI 28.94 kg/m   Vitals and nursing note reviewed  General: NAD, nonverbal, pacing around the room Cardiac: RRR, S1 S2 present. normal heart sounds, no murmurs. Respiratory: CTAB, normal effort, No wheezes, rales or rhonchi Extremities: no edema or cyanosis. Skin: warm and dry, area of recurrent picking noted on R antecubital region, no surround erythema or drainage L Great Toe:  Neuro: alert, no obvious focal deficits  Assessment & Plan:   Ingrown left big toenail Per Mom, the toe appears much improved from prior. However, there is still concern for localized infection on exam today (see image above) with ingrown toenail and likely paronychia. Will treat with 7 day course of doxycycline. Patient already established with podiatry-- advised to call as soon as possible for removal under sedation  Encounter to Establish Care History reviewed. Patient with complex past medical history including autism,  nonverbal, akathisia, tic disorder, and compulsive skin picking. She is followed closely by Dr. Artis Flock and fortunately has excellent care at home. Will obtain records from prior PCP [facility]).  Health Maintenance BMI is slightly elevated (28). Patient has compulsive eating habits per Mom. Abilify may contribute to this as well. Given her history, I think she is at a very reasonable BMI. Fortunately, her last A1c and lipid panel in 2021 were within normal limits. Will obtain repeat screening labs the next time she is sedated (likely soon for toenail removal, orders have already been placed by Dr. Artis Flock). No indication for Metformin or other medication at this time.  Maury Dus, MD [facility] Family Medicine PGY-2

## 2020-10-27 NOTE — Progress Notes (Signed)
ROI completed to [facility]. ROI given to [employer] in the front office to process. Jone Baseman, CMA

## 2020-10-29 DIAGNOSIS — L600 Ingrowing nail: Secondary | ICD-10-CM | POA: Insufficient documentation

## 2020-10-29 NOTE — Assessment & Plan Note (Signed)
Per Mom, the toe appears much improved from prior. However, there is still concern for localized infection on exam today (see image above) with ingrown toenail and likely paronychia. Will treat with 7 day course of doxycycline. Patient already established with podiatry-- advised to call as soon as possible for removal under sedation

## 2020-11-06 ENCOUNTER — Telehealth (INDEPENDENT_AMBULATORY_CARE_PROVIDER_SITE_OTHER): Payer: Self-pay | Admitting: Pediatrics

## 2020-11-06 NOTE — Telephone Encounter (Signed)
  Who's calling (name and relationship to patient) :mom/ [name]  Best contact [phone]  Provider they see:Dr. Artis Flock   Reason for call:mo called needing a PA for a communication devise and necessary paper work filled out as well. Mo has requested a call back with questions she has about these forms. Please advise.      PRESCRIPTION REFILL ONLY  Name of prescription:  Pharmacy:

## 2020-11-06 NOTE — Telephone Encounter (Signed)
This paperwork has been signed and appropriate documentation is in the last note.  Ellie, please follow-up and then contact mother with status.   Lorenz Coaster MD MPH

## 2020-11-07 NOTE — Telephone Encounter (Signed)
Called mom to inform that I confirmed with [facility] that they did receive necessary documentation to fill the order for the communication device.

## 2020-11-07 NOTE — Telephone Encounter (Signed)
Spoke with mom, who states she has been receiving emails from [facility] about not having documents from our office. I asked her to forward the emails to [place], she also provided the numbers [phone] - to [facility] from the office and office number [phone]. I will call to talk with them about what they are needing.

## 2020-11-07 NOTE — Telephone Encounter (Signed)
Faxed signed prescription, [place] [facility] form, and 10/04/20 office notes to [phone].

## 2020-11-30 ENCOUNTER — Telehealth (INDEPENDENT_AMBULATORY_CARE_PROVIDER_SITE_OTHER): Payer: Self-pay | Admitting: Pediatrics

## 2020-11-30 NOTE — Telephone Encounter (Signed)
  Who's calling (name and relationship to patient) :[name]  MOm  Best contact number:[phone]  Provider they see: Dr. Artis Flock   Reason for call: Mom received email yesterday stating they still have not received  prescription for the device      PRESCRIPTION REFILL ONLY  Name of prescription:  Pharmacy:

## 2020-12-04 NOTE — Telephone Encounter (Signed)
Document was faxed to [facility] on 10/16/20, with Jana Hakim signature as Dr. Artis Flock was out of the office. We received blank forms again on 12/01/20 which Dr. Artis Flock has signed and I have sent again.

## 2020-12-06 NOTE — Telephone Encounter (Signed)
Called [facility] to confirm that they had received the documents again and called mom to inform that they confirmed receiving them.

## 2021-02-22 ENCOUNTER — Ambulatory Visit (INDEPENDENT_AMBULATORY_CARE_PROVIDER_SITE_OTHER): Payer: Medicaid Other | Admitting: Family Medicine

## 2021-02-22 ENCOUNTER — Other Ambulatory Visit: Payer: Self-pay

## 2021-02-22 ENCOUNTER — Ambulatory Visit: Payer: Self-pay

## 2021-02-22 VITALS — Ht 66.5 in | Wt 184.2 lb

## 2021-02-22 DIAGNOSIS — L03213 Periorbital cellulitis: Secondary | ICD-10-CM

## 2021-02-22 MED ORDER — CEPHALEXIN 500 MG PO CAPS: 500.0000 mg | ORAL_CAPSULE | Freq: Four times a day (QID) | ORAL | 0 refills | Status: AC

## 2021-02-22 NOTE — Patient Instructions (Signed)
Thank you for coming to see me today. It was a pleasure.   Take Keflex 500 mg four times a day for 5 days  Warm compresses frequently  Please follow-up with PCP if symptoms worsen  If you have any questions or concerns, please do not hesitate to call the office at [phone].  Best,   Dana Allan, MD

## 2021-02-22 NOTE — Progress Notes (Signed)
    SUBJECTIVE:   CHIEF COMPLAINT / HPI: swollen eye  Patient brought to clinic by mom with concern for swollen right eye.  Noticed a pimple like lesion on right eyebrow few days ago and mom is not sure if Novali has have been picking at it.  She reports some bloody drainage from it yesterday.  No reports of eye trauma, fevers, eye drainage or redness. Possibly scratching at eyes.  Currently in day school but unknown if other students have any similar symptoms  PERTINENT  PMH / PSH:  Autism Compulsive skin picking  OBJECTIVE:   Ht 5' 6.5" (1.689 m)   Wt 184 lb 3.2 oz (83.6 kg)   BMI 29.29 kg/m    General: Alert, no acute distress HEENT: tacking appropriately without occular pain, no conjunctival redness, periorbital erythema and edema,  small firm tender area scabbed over appreciated   ASSESSMENT/PLAN:   Periorbital cellulitis of left eye Difficulty to exam given cooperativeness.  Low suspicion for foreign body or postseptal cellulitis. Keflex 500 mg QID x 5 days Letter for school provided, abel to return to school on 12/12 and school able to administer remaining doses of antibiotics. Follow up with PCP if does not improve Strict return precautions provided     Dana Allan, MD [facility]

## 2021-02-26 ENCOUNTER — Encounter: Payer: Self-pay | Admitting: Family Medicine

## 2021-02-26 DIAGNOSIS — L03213 Periorbital cellulitis: Secondary | ICD-10-CM | POA: Insufficient documentation

## 2021-02-26 NOTE — Assessment & Plan Note (Signed)
Difficulty to exam given cooperativeness.  Low suspicion for foreign body or postseptal cellulitis. Keflex 500 mg QID x 5 days Letter for school provided, abel to return to school on 12/12 and school able to administer remaining doses of antibiotics. Follow up with PCP if does not improve Strict return precautions provided

## 2021-04-21 ENCOUNTER — Other Ambulatory Visit (INDEPENDENT_AMBULATORY_CARE_PROVIDER_SITE_OTHER): Payer: Self-pay | Admitting: Pediatrics

## 2021-04-23 NOTE — Telephone Encounter (Signed)
Prescriptions approved.  Patient will need to be seen before further prescriptions can be sent.   Lorenz Coaster MD MPH

## 2021-04-25 ENCOUNTER — Telehealth (INDEPENDENT_AMBULATORY_CARE_PROVIDER_SITE_OTHER): Payer: Self-pay | Admitting: Pediatrics

## 2021-04-25 NOTE — Telephone Encounter (Signed)
Authorization received. Pharmacy and mom made aware.

## 2021-04-25 NOTE — Telephone Encounter (Signed)
°  Who's calling (name and relationship to patient) :Mom/ [name]   Best contact number:[phone]  Provider they see:Dr. Artis Flock   Reason for call:mom called stating that the pharmacy told her they need a PA for the Abilify. Patient is out of medication but mom stated that the pharmacy gave her enough until the office is able to get the PA      PRESCRIPTION REFILL ONLY  Name of prescription:Abilify   Pharmacy:[facility]

## 2021-06-05 ENCOUNTER — Encounter: Payer: Self-pay | Admitting: Family Medicine

## 2021-06-05 ENCOUNTER — Telehealth: Payer: Self-pay | Admitting: *Deleted

## 2021-06-05 ENCOUNTER — Other Ambulatory Visit: Payer: Self-pay

## 2021-06-05 ENCOUNTER — Ambulatory Visit (INDEPENDENT_AMBULATORY_CARE_PROVIDER_SITE_OTHER): Payer: Medicaid Other | Admitting: Family Medicine

## 2021-06-05 VITALS — Wt 187.0 lb

## 2021-06-05 DIAGNOSIS — L659 Nonscarring hair loss, unspecified: Secondary | ICD-10-CM | POA: Diagnosis not present

## 2021-06-05 DIAGNOSIS — H1033 Unspecified acute conjunctivitis, bilateral: Secondary | ICD-10-CM | POA: Diagnosis present

## 2021-06-05 MED ORDER — ERYTHROMYCIN 5 MG/GM OP OINT: 1.0000 "application " | TOPICAL_OINTMENT | Freq: Two times a day (BID) | OPHTHALMIC | 0 refills | Status: DC

## 2021-06-05 MED ORDER — SULFACETAMIDE SODIUM 10 % OP OINT: TOPICAL_OINTMENT | Freq: Four times a day (QID) | OPHTHALMIC | 0 refills | Status: DC

## 2021-06-05 MED ORDER — SULFACETAMIDE SODIUM 10 % OP SOLN: 1.0000 [drp] | OPHTHALMIC | 0 refills | Status: AC

## 2021-06-05 MED ORDER — CIPROFLOXACIN HCL 0.3 % OP SOLN: 1.0000 [drp] | OPHTHALMIC | 0 refills | Status: DC

## 2021-06-05 NOTE — Addendum Note (Signed)
Addended by: Bing Neighbors on: 06/05/2021 03:20 PM ? ? Modules accepted: Orders ? ?

## 2021-06-05 NOTE — Assessment & Plan Note (Addendum)
Sulfacetamide prescribed for 5-day course ?Recommend ophthalmology evaluation if no improvement over the next week ?

## 2021-06-05 NOTE — Progress Notes (Addendum)
? ? ?  SUBJECTIVE:  ? ?CHIEF COMPLAINT / HPI: eye irritation  ? ?Patient presents with her mother, Gabrielle [name], mother reports that the patient woke this morning with substance coming from both of her eyes.  Patient's father was recently diagnosed with a sinus infection and also had red and irritated eyes with drainage.  Mother has not noticed any new symptoms such as increased congestion but states that the patient has congestion at baseline.  She has not had new cough.  Mother denies any fever.  She denies any gastrointestinal symptoms.  Patient has not shown any signs of having a sore throat.  Mother states that she has been eating and drinking normally.  She expresses some concern about using eyedrops as patient is nonverbal and has history of intellectual disability.  Mother denies any rhinorrhea. ? ? ?Hair loss ?Patient's mother also reports that for the past year, patient has had hair loss.  The area initially started as a size of a medallion but has now expanded.  Mother shows several pictures on her phone spanning from August 2022.  Mother reports that she also has skin picking disorder but is concerned that the hair loss may be due to some underlying dermatologic problems as the skin is often red and has scales.  She does not note any bleeding.  Hair often will grow back after the initial inflammatory state.Mother states that she has been attempting to get the patient connected with a dermatologist without success. ? ? ?PERTINENT  PMH / PSH:  ?Dec 2022, periorbital cellulitis  ? ?OBJECTIVE:  ? ?Wt 187 lb (84.8 kg)   BMI 29.73 kg/m?   ?Physical Exam ?HENT:  ?   Head: No right periorbital erythema or left periorbital erythema. Hair is abnormal.  ?   Right Ear: There is impacted cerumen.  ?   Left Ear: There is impacted cerumen.  ?   Nose: Rhinorrhea present.  ?   Mouth/Throat:  ?   Mouth: Mucous membranes are moist.  ?   Tongue: No lesions.  ?   Pharynx: No posterior oropharyngeal erythema.   ? ? ?ASSESSMENT/PLAN:  ? ?Patchy loss of hair ?Will refer to dermatology ? ?Acute bacterial conjunctivitis of both eyes ?Sulfacetamide prescribed for 5-day course ?Recommend ophthalmology evaluation if no improvement over the next week ?  ?Pharmacy does not have sulfacetamide available in solution form so erythromycin prescribed ? ?Ronnald Ramp, MD ?[facility]  ?

## 2021-06-05 NOTE — Patient Instructions (Addendum)
Gabrielle [name]'s symptoms are due to conjunctivitis. ? ?I will prescribe an antibiotic drop for 5 days.  Please use at least 3 times a day up to 4 times per day.  ? ?I recommend seeing an ophthalmologist in 1 week if the redness and irritation does not improve. ? ?I have also placed a referral to dermatology for her hair loss. Please be on the lookout for a call from their scheduler in the next two weeks.  ? ? ?

## 2021-06-05 NOTE — Addendum Note (Signed)
Addended by: Bing Neighbors on: 06/05/2021 04:35 PM ? ? Modules accepted: Orders ? ?

## 2021-06-05 NOTE — Telephone Encounter (Signed)
Pharmacy calls back.  They can get the solution instead of the ointment if that is appropriate. Jone Baseman, CMA ? ?

## 2021-06-05 NOTE — Telephone Encounter (Signed)
Pharmacy calls to inform us that the sulfacetamide ointment is not available.  They are requesting that you send in something different, like azithromycin, if appropriate.  Jone Baseman, CMA ? ?

## 2021-06-05 NOTE — Addendum Note (Signed)
Addended by: Bing Neighbors on: 06/05/2021 05:13 PM ? ? Modules accepted: Orders ? ?

## 2021-06-05 NOTE — Assessment & Plan Note (Signed)
Will refer to dermatology

## 2021-06-05 NOTE — Telephone Encounter (Signed)
Rx resubmitted for Sulfacetamide solution  ? ?

## 2021-06-19 ENCOUNTER — Other Ambulatory Visit (INDEPENDENT_AMBULATORY_CARE_PROVIDER_SITE_OTHER): Payer: Self-pay | Admitting: Pediatrics
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
## 2021-06-25 ENCOUNTER — Encounter (INDEPENDENT_AMBULATORY_CARE_PROVIDER_SITE_OTHER): Payer: Self-pay | Admitting: Pediatrics

## 2021-06-25 ENCOUNTER — Ambulatory Visit (INDEPENDENT_AMBULATORY_CARE_PROVIDER_SITE_OTHER): Payer: Medicaid Other | Admitting: Pediatrics

## 2021-06-25 VITALS — Wt 188.0 lb

## 2021-06-25 DIAGNOSIS — F424 Excoriation (skin-picking) disorder: Secondary | ICD-10-CM

## 2021-06-25 DIAGNOSIS — F633 Trichotillomania: Secondary | ICD-10-CM

## 2021-06-25 DIAGNOSIS — G2571 Drug induced akathisia: Secondary | ICD-10-CM | POA: Diagnosis not present

## 2021-06-25 DIAGNOSIS — F840 Autistic disorder: Secondary | ICD-10-CM | POA: Diagnosis not present

## 2021-06-25 DIAGNOSIS — G479 Sleep disorder, unspecified: Secondary | ICD-10-CM

## 2021-06-25 MED ORDER — CLONIDINE HCL 0.1 MG PO TABS: 0.1000 mg | ORAL_TABLET | Freq: Two times a day (BID) | ORAL | 5 refills | Status: DC

## 2021-06-25 MED ORDER — CITALOPRAM HYDROBROMIDE 20 MG PO TABS: 20.0000 mg | ORAL_TABLET | Freq: Every day | ORAL | 3 refills | Status: DC

## 2021-06-25 MED ORDER — N-ACETYL-L-CYSTEINE 600 MG PO CAPS: 1200.0000 mg | ORAL_CAPSULE | Freq: Two times a day (BID) | ORAL | 5 refills | Status: DC

## 2021-06-25 MED ORDER — TRAZODONE HCL 50 MG PO TABS
100.0000 mg | ORAL_TABLET | Freq: Every day | ORAL | 5 refills | Status: DC
Start: 2021-06-25 — End: 2021-09-17

## 2021-06-25 MED ORDER — ARIPIPRAZOLE 10 MG PO TABS: 10.0000 mg | ORAL_TABLET | Freq: Every day | ORAL | 5 refills | Status: DC

## 2021-06-25 NOTE — Patient Instructions (Addendum)
Continue with clonidine(hyperactivity), trazodone (sleep), abilify (aggression), and N-acetyl-cystine(picking) ?Try starting 20 mg of Celexa for anxiety and trichotillomania (picking of hair) ?Placed a referral for psychiatry today.  ?Keep appointment with dermatology ?Contact me in 1 month to decide what to do next ?

## 2021-07-09 ENCOUNTER — Encounter (INDEPENDENT_AMBULATORY_CARE_PROVIDER_SITE_OTHER): Payer: Self-pay | Admitting: Pediatrics

## 2021-07-12 ENCOUNTER — Encounter (INDEPENDENT_AMBULATORY_CARE_PROVIDER_SITE_OTHER): Payer: Self-pay | Admitting: Pediatrics

## 2021-08-02 ENCOUNTER — Telehealth (INDEPENDENT_AMBULATORY_CARE_PROVIDER_SITE_OTHER): Payer: Self-pay | Admitting: Pediatrics

## 2021-08-02 NOTE — Telephone Encounter (Signed)
  Name of who is calling:[name]   Caller's Relationship to Patient:Mother   Best contact [phone]  Provider they see:Dr.Wolfe  Reason for call:mom called needing clarification on referrals that were sent out to [facility] as well as another doctor's office that called to schedule her as well.      PRESCRIPTION REFILL ONLY  Name of prescription:  Pharmacy:

## 2021-08-03 NOTE — Telephone Encounter (Signed)
Spoke with mom and she informs Dr. Gloris Manchester office reached out to her to schedule an appointment, but mom held off as she was of the impression that Dr. Remus Blake office is who would be contacting her. Informed mom that the MyChart sent on 04/24 indicated that she had not heard from anyone, so the referral was sent to Dr. Gloris Manchester office to get Allena in an office soon. Mom states she scheduled an appointment with Dr. Remus Blake office and ended the call.

## 2021-08-21 ENCOUNTER — Encounter: Payer: Self-pay | Admitting: *Deleted

## 2021-09-17 ENCOUNTER — Ambulatory Visit (INDEPENDENT_AMBULATORY_CARE_PROVIDER_SITE_OTHER): Payer: Medicaid Other | Admitting: Student in an Organized Health Care Education/Training Program

## 2021-09-17 ENCOUNTER — Encounter (HOSPITAL_COMMUNITY): Payer: Self-pay | Admitting: Student in an Organized Health Care Education/Training Program

## 2021-09-17 DIAGNOSIS — F840 Autistic disorder: Secondary | ICD-10-CM

## 2021-09-17 DIAGNOSIS — G2569 Other tics of organic origin: Secondary | ICD-10-CM | POA: Diagnosis not present

## 2021-09-17 DIAGNOSIS — F984 Stereotyped movement disorders: Secondary | ICD-10-CM

## 2021-09-17 DIAGNOSIS — L659 Nonscarring hair loss, unspecified: Secondary | ICD-10-CM

## 2021-09-17 DIAGNOSIS — F89 Unspecified disorder of psychological development: Secondary | ICD-10-CM

## 2021-09-17 MED ORDER — FLUOXETINE HCL 20 MG/5ML PO SOLN: 10.0000 mg | Freq: Every day | ORAL | 1 refills | Status: DC

## 2021-09-17 MED ORDER — TRAZODONE HCL 50 MG PO TABS: 25.0000 mg | ORAL_TABLET | Freq: Every day | ORAL | 1 refills | Status: DC

## 2021-09-17 MED ORDER — ARIPIPRAZOLE 5 MG PO TABS: 5.0000 mg | ORAL_TABLET | Freq: Two times a day (BID) | ORAL | 2 refills | Status: DC

## 2021-09-17 NOTE — Progress Notes (Signed)
Psychiatric Initial Adult Assessment   Patient Identification: Gabrielle [name] MRN:  [id number] Date of Evaluation:  09/17/2021 Referral Source: PCP Chief Complaint: Medication management Chief Complaint  Patient presents with   New Patient (Initial Visit)   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 Patchy loss of hair  L65.9     5. Developmental disability  F89       History of Present Illness: Gabrielle [name] is a 21 year old patient with a history of autism, skin picking excoriation disorder, tic, developmental delay and akathisia of unknown etiology.  Patient presents today with her mother and father.  Per mother, patient is nonverbal at baseline.  Objectively, patient is difficult to get to sit down in the room.  Attempts were made to get patient's vitals however she became uncomfortable wants the blood pressure cuff began to squeeze and she took it off and began to wander around the room.  Patient is noted to have hand flapping, repetitive movements significant intrusiveness.  Patient's mother and father try to distract patient by providing her nuts and berries snacks throughout the visit while she roams the room.  At one point patient did try to leave the room and her father had to block the door to which patient easily turned around.  Patient would intermittently become very loud, patient reports parents endorse that patient appeared to be in a good mood today and was very happy based off of this behavior.  On occasion patient would have moments where she would jump up and down while flapping her hands.  Patient assessment was done based off of material provided by patient's parents and discussion with patient's parents.  Mom provided the document endorsing that they are most  concerned with patient's skin excoriation behaviors.  Mom provided pictures showing patient picking at pieces of her body until they begin to track and she has had multiple infections requiring antibiotics.  Mom endorsed that they have tried behavior modification using sensory diet including having glitter bottles as a way to occupy her hands however, patient will continue to pick at her skin during car rides and other times that she is more likely to get away from her parents.  Both parents endorse that patient has also been pulling at her hair although it has started to grow back, patient appears to prefer to pick hair from the back of her head.  Mom reports that patient's repetitive excoriating behaviors have been lifelong although cyclical.  Mom reports that when patient was young she picks at her gums to the point that she had bone exposure.  Mom presented a handout that will be placed in the media tab entitled "update for Suzzette Righter."  Mom reports that at this time they feel that patient current medications have helped to make patient less irritable and agitated.  Mom and dad both endorse safety concerns for when patient becomes more irritable as she is more impulsive and can be a significant risk while in the car.  Mom reports that she just wants to ensure that patient's current regimen although beneficial for her irritability is not causing patient to have other issues.  Both parents are concerned that patient has what sounds like hyperphasia.  Dad reports that patient will eat out of the garbage, Food, and is constantly putting things in her mouth.  Both parents endorse that patient appears to be gaining significant amount of weight.  Associated Signs/Symptoms: Patient nonverbal  Past Psychiatric History:  Patient used to be seen by Dr. Jannifer Franklin, was later transferred to Dr. Lorenz Coaster.  Patient was last seen by Dr. Lorenz Coaster (pediatric neurologist) 4/10 who endorsed that  patient should return to psychiatric care due to complexity of presentation.  Previous medications include: Celexa (1 week trial, mom reports patient appeared to have trouble sleeping and a flatter affect), Lexapro, Luvox (failed), Zoloft, Focalin (became more agitated and dangerous to self) ropinirole, BuSpar, gabapentin, Trileptal, Lamictal, clonidine, Vistaril, NAC, Valium (prior to procedures), Klonopin, Versed (prior to procedures), Ativan, triazolam (prior to procedures)  Mom endorse some concerns that when patient has tried SSRIs in the past she appears to become more "manic."  When asked for further details Mom reports that patient will become more restless, have outbursts of giggling and her sleep worsens.  Per both parents patient has always had significant trouble sleeping until she was started on trazodone.  Current medications: Trazodone 100 mg (mom feels is over sedating but prefers 75 mg), Abilify 10 mg (treating agitation, irritability and impulsivity fairly well) clonidine 2 mg daily, NAC 2400 mg (per neurology for skin excoriation)  Previous Psychotropic Medications: Yes   Substance Abuse History in the last 12 months:  No.  Consequences of Substance Abuse: NA  Past Medical History:  Past Medical History:  Diagnosis Date   Anemia    Anxiety    Autism disorder    per mother severe autism, very social,  good receptive languange but nonverbal (can do some sign languange) , high tolerence to pain   Dysmenorrhea    Menorrhagia    Nonverbal    PT AUTISTIC   PONV (postoperative nausea and vomiting)    per mother "when wakes up pt can go from 0 to 60 and starts taking off cords and try to sit up to stand, better when mother and father are they when she wakes up"   Repetitive movement    due to autism---  chronic picking of own skin and body    Past Surgical History:  Procedure Laterality Date   DENTAL RESTORATION/EXTRACTION WITH X-RAY     DILATION AND CURETTAGE OF UTERUS  N/A 04/07/2017   Procedure: DILATATION AND CURETTAGE;  Surgeon: Jerene Bears, MD;  Location: [facility];  Service: Gynecology;  Laterality: N/A;   INTRAUTERINE DEVICE (IUD) INSERTION N/A 04/07/2017   Procedure: PLACEMENT OF Rutha Bouchard  IUD;  Surgeon: Jerene Bears, MD;  Location[facility];  Service: Gynecology;  Laterality: N/A;   MASS EXCISION Left 06/15/2015   Procedure: EXCISION OF LEFT AXILLARY MASS/TETANUS INJECTION;  Surgeon: Peggye Form, DO;  Location: [facility];  Service: Plastics;  Laterality: Left;    Family Psychiatric History: Parents report some history of family members with depression.  Family History:  Family History  Problem Relation Age of Onset   Heart disease Maternal Grandmother    Endometriosis Maternal Grandmother    Diabetes Maternal Grandfather    Cancer Maternal Grandfather        Skin Cancer   Asthma Mother    Hyperlipidemia Mother    Hypertension Paternal Grandfather    Hypertension Father    Hyperlipidemia Father    Diverticulosis Father    Arthritis Paternal Grandmother  Social History:   Social History   Socioeconomic History   Marital status: Single    Spouse name: Not on file   Number of children: Not on file   Years of education: Not on file   Highest education level: Not on file  Occupational History   Occupation: [employer] in [place]  Tobacco Use   Smoking status: Never   Smokeless tobacco: Never  Vaping Use   Vaping Use: Never used  Substance and Sexual Activity   Alcohol use: No   Drug use: No   Sexual activity: Never    Birth control/protection: None  Other Topics Concern   Not on file  Social History Narrative   Patient lives with both parents. Alan Ripperlaire graduated from [employer]. She is now at [employer]       Behavior specialist consult with mom once a month.    Therapies: Not at the moment.    Social Determinants of Health   Financial Resource  Strain: Not on file  Food Insecurity: Not on file  Transportation Needs: Not on file  Physical Activity: Not on file  Stress: Not on file  Social Connections: Not on file    Additional Social History:  -Lives at home - Day programming at [employer] - Has an outpatient therapist specializing in autism - Is an only child  Allergies:   Allergies  Allergen Reactions   Cleaner [Ao-Sept Disinfection-Neutral] Hives    BRAND NAME - KABOOM    Metabolic Disorder Labs: Lab Results  Component Value Date   HGBA1C 5.4 08/11/2019   MPG 108.28 08/11/2019   No results found for: "PROLACTIN" Lab Results  Component Value Date   CHOL 165 08/11/2019   TRIG 61 08/11/2019   HDL 51 08/11/2019   CHOLHDL 3.2 08/11/2019   VLDL 12 08/11/2019   LDLCALC 102 (H) 08/11/2019   Lab Results  Component Value Date   TSH 2.188 08/11/2019    Therapeutic Level Labs: No results found for: "LITHIUM" No results found for: "CBMZ" No results found for: "VALPROATE"  Current Medications: Current Outpatient Medications  Medication Sig Dispense Refill   ARIPiprazole (ABILIFY) 5 MG tablet Take 1 tablet (5 mg total) by mouth 2 (two) times daily. 30 tablet 2   FLUoxetine (PROZAC) 20 MG/5ML solution Take 2.5 mLs (10 mg total) by mouth daily. 120 mL 1   traZODone (DESYREL) 50 MG tablet Take 0.5 tablets (25 mg total) by mouth at bedtime. Take 75-100mg  nightly. 60 tablet 1   Acetylcysteine (N-ACETYL-L-CYSTEINE) 600 MG CAPS Take 1,200 mg by mouth in the morning and at bedtime. 120 capsule 5   chlorhexidine (HIBICLENS) 4 % external liquid Apply topically daily as needed. Dilute 10-15 mL in water, Use daily when bathing for 1-2 weeks (Patient not taking: Reported on 06/25/2021) 120 mL 0   citalopram (CELEXA) 20 MG tablet Take 1 tablet (20 mg total) by mouth daily. 30 tablet 3   cloNIDine (CATAPRES) 0.1 MG tablet Take 1 tablet (0.1 mg total) by mouth 2 (two) times daily. 60 tablet 5   erythromycin ophthalmic  ointment Place 1 application. into both eyes 2 (two) times daily. For 5 days (Patient not taking: Reported on 06/25/2021) 3.5 g 0   ibuprofen (ADVIL) 600 MG tablet Take 1 tablet (600 mg total) by mouth every 6 (six) hours as needed. (Patient not taking: Reported on 06/25/2021) 30 tablet 0   KYLEENA 19.5 MG IUD 1 Device by Intrauterine route once for 1 dose. 1  Intra Uterine Device 0   Multiple Vitamin (MULTIVITAMIN) tablet Take 1 tablet by mouth daily.     mupirocin ointment (BACTROBAN) 2 % Apply 1 application topically 2 (two) times daily. 22 g 0   No current facility-administered medications for this visit.    Musculoskeletal: Strength & Muscle Tone: within normal limits Gait & Station: normal Patient leans: N/A  Psychiatric Specialty Exam: Review of Systems  Psychiatric/Behavioral:  Negative for dysphoric mood. The patient is hyperactive.     There were no vitals taken for this visit.There is no height or weight on file to calculate BMI.  General Appearance: Patient wearing T-shirt and shorts with socks and crocks.  Patient up and around the room roaming, occasionally makes loud noises, hand flapping and occasionally will jump up and down while flapping her hands.  Patient likes to grab pupils heads and kiss them, patient also likes to grab provider [place] on badge but is able to continue on without being redirected.  Eye Contact:  None  Speech:  NA  Volume:  Increased  Mood: Per parents, patient appears to be happy  Affect:  NA  Thought Process:  NA  Orientation:  NA  Thought Content:  NA  Suicidal Thoughts:   na  Homicidal Thoughts:  na  Memory:  na  Judgement:  na  Insight: None  Psychomotor Activity:  Restlessness  Concentration:  Concentration: Poor  Recall:  NA  Fund of Knowledge:NA  Language: NA  Akathisia:  Yes    AIMS (if indicated):  not done  Assets:  Social Support  ADL's:  Impaired  Cognition: Impaired,  Severe  Sleep:  Fair   Screenings: Flowsheet Row ED  from 10/03/2020 in [facility]   C-SSRS RISK CATEGORY No Risk       Assessment and Plan: Allye Hoyos is a 21 year old patient with autism who appears to have a significant history of multiple medication trials with different levels of success.  Patient's current regimen appears to be appropriate and is addressing her irritability and agitation that was putting her and her caretakers at risk.  At this point patient skin excoriation his parents primary concern as she has required antibiotics due to infection in the past.  Behavior modification and some adjustments to medication were recommended.  Patient's parents appear to be doing their best to make sure patient has all the resources available.  Patient parents would likely benefit from supportive therapy.  Patient's hyperphagia, trichotillomania, and skin picking may be related to her repetitive behaviors as this appears to be one of the predominant symptoms in this patient related to her autism.  Autism Skin picking excoriation disorder Tics - Change Abilify 10 mg to 5 mg twice daily, not likely to be causing increased weight gain or hyperphagia - Decrease trazodone to 75-100 mg nightly for insomnia - Start Prozac 10 mg (liquid form), for skin picking excoriation and other repetitive behaviors. - Recommended trying satin ballroom gloves, as patient has a history of not doing well with the gloves to the wrist, for behavior modification to assist with skin picking. - F/U in 2 weeks  Collaboration of Care: Medication Management AEB peds neuro  Patient/Guardian was advised Release of Information must be obtained prior to any record release in order to collaborate their care with an outside provider. Patient/Guardian was advised if they have not already done so to contact the registration department to sign all necessary forms in order for [place] to release information regarding  their care.   Consent: Patient/Guardian  gives verbal consent for treatment and assignment of benefits for services provided during this visit. Patient/Guardian expressed understanding and agreed to proceed.   PGY-3 Bobbye Morton, MD 7/3/20234:54 PM

## 2021-10-02 ENCOUNTER — Ambulatory Visit (INDEPENDENT_AMBULATORY_CARE_PROVIDER_SITE_OTHER): Payer: Medicaid Other | Admitting: Student in an Organized Health Care Education/Training Program

## 2021-10-02 DIAGNOSIS — G2569 Other tics of organic origin: Secondary | ICD-10-CM

## 2021-10-02 DIAGNOSIS — L659 Nonscarring hair loss, unspecified: Secondary | ICD-10-CM | POA: Diagnosis not present

## 2021-10-02 DIAGNOSIS — F984 Stereotyped movement disorders: Secondary | ICD-10-CM | POA: Diagnosis not present

## 2021-10-02 DIAGNOSIS — F840 Autistic disorder: Secondary | ICD-10-CM | POA: Diagnosis not present

## 2021-10-02 DIAGNOSIS — F89 Unspecified disorder of psychological development: Secondary | ICD-10-CM

## 2021-10-02 MED ORDER — ARIPIPRAZOLE 5 MG PO TABS: 5.0000 mg | ORAL_TABLET | Freq: Two times a day (BID) | ORAL | 2 refills | Status: DC

## 2021-10-02 MED ORDER — FLUOXETINE HCL 20 MG/5ML PO SOLN: 10.0000 mg | Freq: Every day | ORAL | 1 refills | Status: DC

## 2021-10-02 MED ORDER — TRAZODONE HCL 50 MG PO TABS: 25.0000 mg | ORAL_TABLET | Freq: Every day | ORAL | 2 refills | Status: DC

## 2021-10-02 NOTE — Progress Notes (Signed)
BH MD/PA/NP OP Progress Note  10/02/2021 3:59 PM Gabrielle [name]  MRN:  [id number]  Chief Complaint: No chief complaint on file.  HPI: Gabrielle [name] is a 21 year old patient with a history of autism, skin picking excoriation disorder, tic, developmental delay and akathisia of unknown etiology.  Patient presents today with her mother.  Per mother, patient is nonverbal at baseline.  Patient presents today and appears to be in a pleasant mood.  Patient is again seen in a large room to allow patient space to move around.  Patient's mother reports that she is "pleased" with what she has seen with recent medication adjustments.  Patient mother reports that patient has had less hair picking and mother shows provider new growth in the area where patient had previously had severe alopecia.  Mom reports the patient has also been spending more time inside although she is unsure if this is due to the increasingly hot weather or medication.  Mom reports that she is pleased with patient being indoors more as it is safer for patient.  Mom reports that she has noticed patient picking at her skin less.  Mom is able to show 1 area patient continues to pick however the area is small and less erythematous than previous excoriation areas.  Mom also shows provider and area on patient's left wrist for patient is beginning to heal.  Mom reports that previously when patient skin excoriation disorder was severe, it was uncommon for patient to be able to give her self time to help.  Mom reports that she feels that patient's attention span may be slightly longer when it comes to playing some of her music instruments in the home.  Mom reports that parents decided to treat patient with trazodone at 75 mg and feel patient is doing well on this.  Mom reports the patient had 1 bedwetting incident and she will continue to monitor for this.  In regards to patient appetite, mom reports that she has not noticed patient eating trash however  patient still on occasion takes handfuls of Food.  Mom reports that she is starting to wonder if this may be 2/2 a chromosomal abnormality that patient was found to have early in childhood.  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 Patchy loss of hair  L65.9     5. Developmental disability  F89       Past Psychiatric History:  Last visit: 09/2021-patient started on Prozac 10 mg, liquid form and Abilify 10 mg daily changed to 5 mg twice daily  Patient used to be seen by Dr. Jannifer Franklin, was later transferred to Dr. Lorenz Coaster.  Patient was last seen by Dr. Lorenz Coaster (pediatric neurologist) 4/10 who endorsed that patient should return to psychiatric care due to complexity of presentation.   Previous medications include: Celexa (1 week trial, mom reports patient appeared to have trouble sleeping and a flatter affect), Lexapro, Luvox (failed), Zoloft, Focalin (became more agitated and dangerous to self) ropinirole, BuSpar, gabapentin, Trileptal, Lamictal, clonidine, Vistaril, NAC, Valium (prior to procedures), Klonopin, Versed (prior to procedures), Ativan, triazolam (prior to procedures)  Mom endorse some  concerns that when patient has tried SSRIs in the past she appears to become more "manic."  When asked for further details Mom reports that patient will become more restless, have outbursts of giggling and her sleep worsens.  Per both parents patient has always had significant trouble sleeping until she was started on trazodone.  Current medications: Trazodone 100 mg (mom feels is over sedating but prefers 75 mg), Abilify 10 mg (treating agitation, irritability and impulsivity fairly well) clonidine 2 mg  daily, NAC 2400 mg (per neurology for skin excoriation)  Past Medical History:  Past Medical History:  Diagnosis Date   Anemia    Anxiety    Autism disorder    per mother severe autism, very social,  good receptive languange but nonverbal (can do some sign languange) , high tolerence to pain   Dysmenorrhea    Menorrhagia    Nonverbal    PT AUTISTIC   PONV (postoperative nausea and vomiting)    per mother "when wakes up pt can go from 0 to 60 and starts taking off cords and try to sit up to stand, better when mother and father are they when she wakes up"   Repetitive movement    due to autism---  chronic picking of own skin and body    Past Surgical History:  Procedure Laterality Date   DENTAL RESTORATION/EXTRACTION WITH X-RAY     DILATION AND CURETTAGE OF UTERUS N/A 04/07/2017   Procedure: DILATATION AND CURETTAGE;  Surgeon: Jerene Bears, MD;  Location: [facility];  Service: Gynecology;  Laterality: N/A;   INTRAUTERINE DEVICE (IUD) INSERTION N/A 04/07/2017   Procedure: PLACEMENT OF Rutha Bouchard  IUD;  Surgeon: Jerene Bears, MD;  Location[facility];  Service: Gynecology;  Laterality: N/A;   MASS EXCISION Left 06/15/2015   Procedure: EXCISION OF LEFT AXILLARY MASS/TETANUS INJECTION;  Surgeon: Peggye Form, DO;  Location: [facility];  Service: Plastics;  Laterality: Left;    Family Psychiatric History:  Parents report some history of family members with depression.  Family History:  Family History  Problem Relation Age of Onset   Heart disease Maternal Grandmother    Endometriosis Maternal Grandmother    Diabetes Maternal Grandfather    Cancer Maternal Grandfather        Skin Cancer   Asthma Mother    Hyperlipidemia Mother    Hypertension Paternal Grandfather    Hypertension Father    Hyperlipidemia Father    Diverticulosis Father    Arthritis Paternal Grandmother     Social History:  Social History   Socioeconomic  History   Marital status: Single    Spouse name: Not on file   Number of children: Not on file   Years of education: Not on file   Highest education level: Not on file  Occupational History   Occupation: Medical illustrator in [place]  Tobacco Use   Smoking status: Never   Smokeless tobacco: Never  Vaping Use   Vaping Use: Never used  Substance and Sexual Activity   Alcohol use: No   Drug use: No   Sexual activity: Never    Birth control/protection: None  Other Topics Concern   Not on file  Social History Narrative   Patient lives with both parents. [name] graduated from [employer]. She is now at [employer]       Behavior specialist consult with mom once a month.    Therapies: Not at the moment.  Social Determinants of Health   Financial Resource Strain: Not on file  Food Insecurity: Not on file  Transportation Needs: Not on file  Physical Activity: Not on file  Stress: Not on file  Social Connections: Not on file    Allergies:  Allergies  Allergen Reactions   Cleaner [Ao-Sept Disinfection-Neutral] Hives    BRAND NAME - KABOOM    Metabolic Disorder Labs: Lab Results  Component Value Date   HGBA1C 5.4 08/11/2019   MPG 108.28 08/11/2019   No results found for: "PROLACTIN" Lab Results  Component Value Date   CHOL 165 08/11/2019   TRIG 61 08/11/2019   HDL 51 08/11/2019   CHOLHDL 3.2 08/11/2019   VLDL 12 08/11/2019   LDLCALC 102 (H) 08/11/2019   Lab Results  Component Value Date   TSH 2.188 08/11/2019    Therapeutic Level Labs: No results found for: "LITHIUM" No results found for: "VALPROATE" No results found for: "CBMZ"  Current Medications: Current Outpatient Medications  Medication Sig Dispense Refill   Acetylcysteine (N-ACETYL-L-CYSTEINE) 600 MG CAPS Take 1,200 mg by mouth in the morning and at bedtime. 120 capsule 5   ARIPiprazole (ABILIFY) 5 MG tablet Take 1 tablet (5 mg total) by mouth 2 (two) times daily. 60 tablet 2    chlorhexidine (HIBICLENS) 4 % external liquid Apply topically daily as needed. Dilute 10-15 mL in water, Use daily when bathing for 1-2 weeks (Patient not taking: Reported on 06/25/2021) 120 mL 0   cloNIDine (CATAPRES) 0.1 MG tablet Take 1 tablet (0.1 mg total) by mouth 2 (two) times daily. 60 tablet 5   erythromycin ophthalmic ointment Place 1 application. into both eyes 2 (two) times daily. For 5 days (Patient not taking: Reported on 06/25/2021) 3.5 g 0   FLUoxetine (PROZAC) 20 MG/5ML solution Take 2.5 mLs (10 mg total) by mouth daily. 120 mL 1   ibuprofen (ADVIL) 600 MG tablet Take 1 tablet (600 mg total) by mouth every 6 (six) hours as needed. (Patient not taking: Reported on 06/25/2021) 30 tablet 0   KYLEENA 19.5 MG IUD 1 Device by Intrauterine route once for 1 dose. 1 Intra Uterine Device 0   Multiple Vitamin (MULTIVITAMIN) tablet Take 1 tablet by mouth daily.     mupirocin ointment (BACTROBAN) 2 % Apply 1 application topically 2 (two) times daily. 22 g 0   traZODone (DESYREL) 50 MG tablet Take 0.5 tablets (25 mg total) by mouth at bedtime. Take 75-100mg  nightly. 60 tablet 2   No current facility-administered medications for this visit.     Musculoskeletal: Strength & Muscle Tone: within normal limits Gait & Station: normal Patient leans: N/A  Psychiatric Specialty Exam: Review of Systems  Psychiatric/Behavioral:         Patient is non verbal, but mom denies increase in agitation    There were no vitals taken for this visit.There is no height or weight on file to calculate BMI.  General Appearance: Bizarre  Eye Contact:  Poor  Speech:  NA  Volume:   Sounds are still loud however not as extreme as on first visit  Mood: Objectively, patient appears to be in a pleasant mood  Affect:  NA  Thought Process:  NA  Orientation:  NA  Thought Content: NA   Suicidal Thoughts:   N/A  Homicidal Thoughts:   N/A  Memory:  NA  Judgement:  NA  Insight:  NA  Psychomotor Activity:  Increased   Concentration: NA  Recall: NA  Fund of Knowledge: N/A  Language: Poor  Akathisia:  NA  Handed: N/A  AIMS (if indicated): N/A  Assets:  Social Support  ADL's:  Impaired  Cognition: Impaired,  Severe, baseline  Sleep:  Fair   Screenings: Flowsheet Row ED from 10/03/2020 in [facility]   C-SSRS RISK CATEGORY No Risk        Assessment and Plan:   Based on mom's observations, patient appears to be responding well to current medications.  Patient is only been on current medications for approximately 2 weeks.  It was briefly discussed about attempting to maximize patient's response to Prozac however mom endorse that she would like to continue to give Prozac another 4 weeks in order to see true therapeutic benefit.  Patient also has a history of behaviors concerning for hypomanic behavior in previous SSRIs, therefore it is in patient's best interest to slowly titrate up on Prozac, however patient was not on an adjunct of antipsychotic therapy when previously noted to have hypomanic like behavior while on SSRI.  Patient's Abilify will provide some level of protective factors to prevent or decrease risk of hypomanic behavior while on SSRI.  *Vitals were not done for patient-patient is not able to sit for vitals nor can she bear the feeling of the blood pressure cuff squeezing.  Autism Skin picking excoriation disorder Tics - Continue Abilify 5 mg twice daily - Continue trazodone 75 mg nightly - Continue Prozac 10 mg (liquid form), for skin picking excoriation and other repetitive behaviors. - Recommended trying satin ballroom gloves, as patient has a history of not doing well with the gloves to the wrist, for behavior modification to assist with skin picking. - F/U in 4 weeks    Collaboration of Care: Collaboration of Care:  Patient/Guardian was advised Release of Information must be obtained prior to any record release in order to collaborate their care  with an outside provider. Patient/Guardian was advised if they have not already done so to contact the registration department to sign all necessary forms in order for [place] to release information regarding their care.   Consent: Patient/Guardian gives verbal consent for treatment and assignment of benefits for services provided during this visit. Patient/Guardian expressed understanding and agreed to proceed.   PGY-3 Bobbye Morton, MD 10/02/2021, 3:59 PM

## 2021-10-18 NOTE — Progress Notes (Signed)
Patient: Gabrielle [name] MRN: [id number] Sex: female DOB: 2000/06/25  Provider: Lorenz Coaster, MD Location of Care: [facility]  Note type: Routine follow-up  History of Present Illness:  Gabrielle [name] is a 21 y.o. female with history of autism, developmental delay, tic disorder with compulsive picking, and akathesia of unknown etiology who I am seeing for routine follow-up. Patient was last seen on 06/25/21 where I started Celexa, continued all other medications, and referred to psychiatry.  Since the last appointment, mom reported they trial-ed the Celexa from 4/15 to 4/22 but stopped due to flattened behaviors as well as giggling with concern for mania no improvement in picking.  She also saw Behavioral health on 10/02/21 who decreased trazodone to 75 mg at night, split Abilify dose, and started Prozac 10 mg.  Patient presents today with mom who reports that the first two weeks after medications changes her hair pulling stopped. Then trial-ed the decreased trazodone, which then brought back the picking and hair pulling.  Mom increased Trazodone back and hair pulling has stopped.    She has seen no side effects from the Prozac. Continues with this, trazodone, Abilify managed by Dr. Morrie Sheldon as well as clonidine, and acetylcysteine which I am ordering.  Past Medical History Past Medical History:  Diagnosis Date   Anemia    Anxiety    Autism disorder    per mother severe autism, very social,  good receptive languange but nonverbal (can do some sign languange) , high tolerence to pain   Dysmenorrhea    Menorrhagia    Nonverbal    PT AUTISTIC   PONV (postoperative nausea and vomiting)    per mother "when wakes up pt can go from 0 to 60 and starts taking off cords and try to sit up to stand, better when mother and father are they when she wakes up"   Repetitive movement    due to autism---  chronic picking of own skin and body    Surgical History Past  Surgical History:  Procedure Laterality Date   DENTAL RESTORATION/EXTRACTION WITH X-RAY     DILATION AND CURETTAGE OF UTERUS N/A 04/07/2017   Procedure: DILATATION AND CURETTAGE;  Surgeon: Jerene Bears, MD;  Location: [facility];  Service: Gynecology;  Laterality: N/A;   INTRAUTERINE DEVICE (IUD) INSERTION N/A 04/07/2017   Procedure: PLACEMENT OF Rutha Bouchard  IUD;  Surgeon: Jerene Bears, MD;  Location: [facility];  Service: Gynecology;  Laterality: N/A;   MASS EXCISION Left 06/15/2015   Procedure: EXCISION OF LEFT AXILLARY MASS/TETANUS INJECTION;  Surgeon: Peggye Form, DO;  Location: [facility];  Service: Plastics;  Laterality: Left;    Family History family history includes Arthritis in her paternal grandmother; Asthma in her mother; Cancer in her maternal grandfather; Diabetes in her maternal grandfather; Diverticulosis in her father; Endometriosis in her maternal grandmother; Heart disease in her maternal grandmother; Hyperlipidemia in her father and mother; Hypertension in her father and paternal grandfather.   Social History Social History   Social History Narrative   Patient lives with both parents. Gabrielle [name] graduated from [employer]. She is now at [employer]       Behavior specialist consult with mom once a month.    Therapies: Not at the moment.     Allergies Allergies  Allergen Reactions   Cleaner [Ao-Sept Disinfection-Neutral] Hives    BRAND NAME - KABOOM    Medications Current Outpatient  Medications on File Prior to Visit  Medication Sig Dispense Refill   ARIPiprazole (ABILIFY) 5 MG tablet Take 1 tablet (5 mg total) by mouth 2 (two) times daily. 60 tablet 2   FLUoxetine (PROZAC) 20 MG/5ML solution Take 2.5 mLs (10 mg total) by mouth daily. 120 mL 1   Multiple Vitamin (MULTIVITAMIN) tablet Take 1 tablet by mouth daily.     mupirocin ointment (BACTROBAN) 2 % Apply 1 application topically 2 (two) times  daily. 22 g 0   traZODone (DESYREL) 50 MG tablet Take 0.5 tablets (25 mg total) by mouth at bedtime. Take 75-100mg  nightly. 60 tablet 2   ibuprofen (ADVIL) 600 MG tablet Take 1 tablet (600 mg total) by mouth every 6 (six) hours as needed. (Patient not taking: Reported on 06/25/2021) 30 tablet 0   KYLEENA 19.5 MG IUD 1 Device by Intrauterine route once for 1 dose. 1 Intra Uterine Device 0   No current facility-administered medications on file prior to visit.   The medication list was reviewed and reconciled. All changes or newly prescribed medications were explained.  A complete medication list was provided to the patient/caregiver.  Physical Exam BP (!) 140/60   Pulse 90   Wt 186 lb 15.2 oz (84.8 kg)   BMI 29.72 kg/m  Facility age limit for growth %iles is 20 years.  No results found. Gen: well appearing child Skin: No rash, No neurocutaneous stigmata. HEENT: Normocephalic, no dysmorphic features, no conjunctival injection, nares patent, mucous membranes moist, oropharynx clear. Neck: Supple, no meningismus. No focal tenderness. Resp: Clear to auscultation bilaterally CV: Regular rate, normal S1/S2, no murmurs, no rubs Abd: BS present, abdomen soft, non-tender, non-distended. No hepatosplenomegaly or mass Ext: Warm and well-perfused. No deformities, no muscle wasting, ROM full.  Neurological Examination: MS: Awake, alert, interactive. Poor eye contact, answers pointed questions with 1 word answers, speech was fluent.  Poor attention in room, mostly plays by herself. Cranial Nerves: Pupils were equal and reactive to light;  EOM normal, no nystagmus; no ptsosis, no double vision, intact facial sensation, face symmetric with full strength of facial muscles, hearing intact grossly.  Motor-Normal tone throughout, Normal strength in all muscle groups. No abnormal movements Reflexes- Reflexes 2+ and symmetric in the biceps, triceps, patellar and achilles tendon. Plantar responses flexor  bilaterally, no clonus noted Sensation: Intact to light touch throughout.   Coordination: No dysmetria with reaching for objects    Diagnosis: 1. Compulsive skin picking   2. 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 Sleeping difficulty   5. Developmental disability   6. Akathisia   7. Agitation   8. Overeating      Assessment and Plan Gabrielle [name] is a 21 y.o. female with history of autism, developmental delay, tic disorder with compulsive picking, and akathesia of unknown etiology who I am seeing in follow-up. Picking and hair pulling improved on Prozac. However, symptoms worsened with the decrease in Trazodone, suggesting anxiety was improved on this medication as well. Agree with increasing Trazodone back to 100 mg q night. Sleeps well on current medication dosage, continued this today. I am currently managing her clonidine and acetylcysteine, however, given these medications interact with other psychiatry medications defer to Dr. Morrie Sheldon if she would like to manage them.   - Refilled Clonidine and acetylcysteine   I spent 40 minutes on day of service on this patient including review of chart, discussion with patient and family, discussion of screening results, coordination with other providers and  management of orders and paperwork.     Return in about 3 months (around 01/22/2022).  I, Scharlene Gloss, scribed for and in the presence of Carylon Perches, MD at today's visit on 10/22/2021.   I, Carylon Perches MD MPH, personally performed the services described in this documentation, as scribed by Scharlene Gloss in my presence on 10/22/2021 and it is accurate, complete, and reviewed by me.    Carylon Perches MD MPH Neurology and [facility]  [place], [place], [place] [address] Phone: [phone] Fax: [phone]

## 2021-10-22 ENCOUNTER — Ambulatory Visit (INDEPENDENT_AMBULATORY_CARE_PROVIDER_SITE_OTHER): Payer: Medicaid Other | Admitting: Pediatrics

## 2021-10-22 ENCOUNTER — Encounter (INDEPENDENT_AMBULATORY_CARE_PROVIDER_SITE_OTHER): Payer: Self-pay | Admitting: Pediatrics

## 2021-10-22 VITALS — BP 140/60 | HR 90 | Wt 186.9 lb

## 2021-10-22 DIAGNOSIS — F840 Autistic disorder: Secondary | ICD-10-CM

## 2021-10-22 DIAGNOSIS — G479 Sleep disorder, unspecified: Secondary | ICD-10-CM | POA: Diagnosis not present

## 2021-10-22 DIAGNOSIS — R451 Restlessness and agitation: Secondary | ICD-10-CM

## 2021-10-22 DIAGNOSIS — F424 Excoriation (skin-picking) disorder: Secondary | ICD-10-CM | POA: Diagnosis not present

## 2021-10-22 DIAGNOSIS — G2571 Drug induced akathisia: Secondary | ICD-10-CM

## 2021-10-22 DIAGNOSIS — F633 Trichotillomania: Secondary | ICD-10-CM

## 2021-10-22 DIAGNOSIS — F89 Unspecified disorder of psychological development: Secondary | ICD-10-CM

## 2021-10-22 DIAGNOSIS — R632 Polyphagia: Secondary | ICD-10-CM

## 2021-10-22 MED ORDER — CLONIDINE HCL 0.1 MG PO TABS
0.1000 mg | ORAL_TABLET | Freq: Two times a day (BID) | ORAL | 5 refills | Status: DC
Start: 2021-10-22 — End: 2021-10-30

## 2021-10-22 MED ORDER — N-ACETYL-L-CYSTEINE 600 MG PO CAPS: 1200.0000 mg | ORAL_CAPSULE | Freq: Two times a day (BID) | ORAL | 5 refills | Status: DC

## 2021-10-22 NOTE — Patient Instructions (Addendum)
I refilled her clonidine and acetylcysteine today.  Continue to follow up with Dr. Morrie Sheldon on 10/30/21.

## 2021-10-29 ENCOUNTER — Encounter (INDEPENDENT_AMBULATORY_CARE_PROVIDER_SITE_OTHER): Payer: Self-pay | Admitting: Pediatrics

## 2021-10-30 ENCOUNTER — Ambulatory Visit (INDEPENDENT_AMBULATORY_CARE_PROVIDER_SITE_OTHER): Payer: Medicaid Other | Admitting: Student in an Organized Health Care Education/Training Program

## 2021-10-30 DIAGNOSIS — F840 Autistic disorder: Secondary | ICD-10-CM

## 2021-10-30 DIAGNOSIS — G2571 Drug induced akathisia: Secondary | ICD-10-CM | POA: Diagnosis not present

## 2021-10-30 MED ORDER — CLONIDINE HCL 0.1 MG PO TABS: 0.1000 mg | ORAL_TABLET | Freq: Two times a day (BID) | ORAL | 5 refills | Status: DC

## 2021-10-30 MED ORDER — ARIPIPRAZOLE 5 MG PO TABS: 5.0000 mg | ORAL_TABLET | Freq: Two times a day (BID) | ORAL | 2 refills | Status: DC

## 2021-10-30 MED ORDER — N-ACETYL-L-CYSTEINE 600 MG PO CAPS: 1200.0000 mg | ORAL_CAPSULE | Freq: Two times a day (BID) | ORAL | 5 refills | Status: DC

## 2021-10-30 MED ORDER — BENZTROPINE MESYLATE 1 MG PO TABS: 1.0000 mg | ORAL_TABLET | Freq: Every day | ORAL | 2 refills | Status: DC

## 2021-10-30 MED ORDER — FLUOXETINE HCL 20 MG/5ML PO SOLN: 10.0000 mg | Freq: Every day | ORAL | 1 refills | Status: DC

## 2021-10-30 MED ORDER — TRAZODONE HCL 50 MG PO TABS
50.0000 mg | ORAL_TABLET | Freq: Every day | ORAL | 2 refills | Status: DC
Start: 2021-10-30 — End: 2022-01-15

## 2021-10-30 NOTE — Progress Notes (Signed)
[place] MD/PA/NP OP Progress Note  10/30/2021 4:34 PM Gabrielle [name]  MRN:  [id number]  Chief Complaint:  Chief Complaint  Patient presents with   Follow-up   HPI: Gabrielle [name] is a 21 year old patient with a history of autism, skin picking excoriation disorder, tic, developmental delay and akathisia of unknown etiology.  Patient presents today with her mother.  Per mother, patient is nonverbal at baseline.  Per EMR, patient's mother had discussed with her PCP, Dr. Rogers Blocker the patient appears to have less excoriating behaviors when she is on 100 mg of trazodone.  It was decided to continue patient on 100 mg due to this observation.  Mom reports, the patient has started picking again although she is not sure that if this behavior is because she has began to open up wounds when her trazodone had been briefly decreased.  Mom reports that she also recently started giving patient approximately 3 mL of Prozac at there had briefly been discussion of increasing patient's Prozac at the last visit, however ultimately it was decided to not do this.  Mom reports that patient has also continued to have cycles of bedwetting, however this is not a new behavior and has not worsened since medication adjustments.  Mom reports that with nighttime alarms, they are able to wake patient up and sometimes able to prevent bedwetting with this pattern.  Mom reports patient continues to have a large appetite.  Mom reports that patient has also started pulling at her hair again although it is not as bad as before and is more concentrated to the left posterior side.  Objectively, patient appears to be more restless and energetic today.  Patient is obviously in a good mood, but she is seen not knowing what to do with her hands and both provider and patient's mother had to pull patient away from picking at the wound on her right elbow.  At times patient will start waving her hands, playing with her fingers or grab her shirt collar and  what appears to be an attempt to keep her hands occupied.  Eventually patient does go back to picking at her arm or her knee.  On 1 attempt to stop patient from picking at her right elbow, patient consequently began leaning against the wall and began rubbing her right elbow against the wall.  Provider was only able to notice this because, provider could hear some creaking on the wall and realized the patient was angled in a way that only this arm was really touching the wall.   Visit Diagnosis:    ICD-10-CM   1. Autism  F84.0 Acetylcysteine (N-ACETYL-L-CYSTEINE) 600 MG CAPS    ARIPiprazole (ABILIFY) 5 MG tablet    cloNIDine (CATAPRES) 0.1 MG tablet    FLUoxetine (PROZAC) 20 MG/5ML solution    traZODone (DESYREL) 50 MG tablet    2. Akathisia  G25.71 benztropine (COGENTIN) 1 MG tablet      Past Psychiatric History: Last visit:   10/02/2021-patient was not having excoriating behaviors, medications were continued. 09/2021-patient started on Prozac 10 mg, liquid form and Abilify 10 mg daily changed to 5 mg twice daily   Patient used to be seen by Dr. Darleene Cleaver, was later transferred to Dr. Carylon Perches.  Patient was last seen by Dr. Carylon Perches (pediatric neurologist) 4/10 who endorsed that patient should return to psychiatric care due to complexity of presentation.   Previous medications include: Celexa (1 week trial, mom reports patient appeared to have trouble sleeping and a flatter  affect), Lexapro, Luvox (failed), Zoloft, Focalin (became more agitated and dangerous to self) ropinirole, BuSpar, gabapentin, Trileptal, Lamictal, clonidine, Vistaril, NAC, Valium (prior to procedures), Klonopin, Versed (prior to procedures), Ativan, triazolam (prior to procedures)  Mom endorse some concerns that when patient has tried SSRIs in the past she appears to become more "manic."  When asked for further details Mom reports that patient will become more restless, have outbursts of giggling and her sleep  worsens.  Per both parents patient has always had significant trouble sleeping until she was started on trazodone.  Medications at initial visit: Trazodone 100 mg (mom feels is over sedating but prefers 75 mg), Abilify 10 mg (treating agitation, irritability and impulsivity fairly well) clonidine 2 mg daily, NAC 2400 mg (per neurology for skin excoriation)  Past Medical History:  Past Medical History:  Diagnosis Date   Anemia    Anxiety    Autism disorder    per mother severe autism, very social,  good receptive languange but nonverbal (can do some sign languange) , high tolerence to pain   Dysmenorrhea    Menorrhagia    Nonverbal    PT AUTISTIC   PONV (postoperative nausea and vomiting)    per mother "when wakes up pt can go from 0 to 60 and starts taking off cords and try to sit up to stand, better when mother and father are they when she wakes up"   Repetitive movement    due to autism---  chronic picking of own skin and body    Past Surgical History:  Procedure Laterality Date   DENTAL RESTORATION/EXTRACTION WITH X-RAY     DILATION AND CURETTAGE OF UTERUS N/A 04/07/2017   Procedure: DILATATION AND CURETTAGE;  Surgeon: Jerene Bears, MD;  Location: [facility];  Service: Gynecology;  Laterality: N/A;   INTRAUTERINE DEVICE (IUD) INSERTION N/A 04/07/2017   Procedure: PLACEMENT OF Rutha Bouchard  IUD;  Surgeon: Jerene Bears, MD;  Location: [facility];  Service: Gynecology;  Laterality: N/A;   MASS EXCISION Left 06/15/2015   Procedure: EXCISION OF LEFT AXILLARY MASS/TETANUS INJECTION;  Surgeon: Peggye Form, DO;  [facility];  Service: Plastics;  Laterality: Left;    Family Psychiatric History:  Parents report some history of family members with depression  Family History:  Family History  Problem Relation Age of Onset   Heart disease Maternal Grandmother    Endometriosis Maternal Grandmother    Diabetes Maternal Grandfather     Cancer Maternal Grandfather        Skin Cancer   Asthma Mother    Hyperlipidemia Mother    Hypertension Paternal Grandfather    Hypertension Father    Hyperlipidemia Father    Diverticulosis Father    Arthritis Paternal Grandmother     Social History:  Social History   Socioeconomic History   Marital status: Single    Spouse name: Not on file   Number of children: Not on file   Years of education: Not on file   Highest education level: Not on file  Occupational History   Occupation: Medical illustrator in [place]  Tobacco Use   Smoking status: Never   Smokeless tobacco: Never  Vaping Use   Vaping Use: Never used  Substance and Sexual Activity   Alcohol use: No   Drug use: No   Sexual activity: Never    Birth control/protection: None  Other Topics Concern   Not on file  Social History Narrative  Patient lives with both parents. Lyndee Leo graduated from [employer]. She is now at [place] specialist consult with mom once a month.    Therapies: Not at the moment.    Social Determinants of Health   Financial Resource Strain: Not on file  Food Insecurity: Not on file  Transportation Needs: Not on file  Physical Activity: Not on file  Stress: Not on file  Social Connections: Not on file    Allergies:  Allergies  Allergen Reactions   Cleaner [Ao-Sept Disinfection-Neutral] Hives    BRAND NAME - KABOOM    Metabolic Disorder Labs: Lab Results  Component Value Date   HGBA1C 5.4 08/11/2019   MPG 108.28 08/11/2019   No results found for: "PROLACTIN" Lab Results  Component Value Date   CHOL 165 08/11/2019   TRIG 61 08/11/2019   HDL 51 08/11/2019   CHOLHDL 3.2 08/11/2019   VLDL 12 08/11/2019   LDLCALC 102 (H) 08/11/2019   Lab Results  Component Value Date   TSH 2.188 08/11/2019    Therapeutic Level Labs: No results found for: "LITHIUM" No results found for: "VALPROATE" No results found for: "CBMZ"  Current  Medications: Current Outpatient Medications  Medication Sig Dispense Refill   benztropine (COGENTIN) 1 MG tablet Take 1 tablet (1 mg total) by mouth daily. 60 tablet 2   Acetylcysteine (N-ACETYL-L-CYSTEINE) 600 MG CAPS Take 1,200 mg by mouth in the morning and at bedtime. 120 capsule 5   ARIPiprazole (ABILIFY) 5 MG tablet Take 1 tablet (5 mg total) by mouth 2 (two) times daily. 60 tablet 2   cloNIDine (CATAPRES) 0.1 MG tablet Take 1 tablet (0.1 mg total) by mouth 2 (two) times daily. 60 tablet 5   FLUoxetine (PROZAC) 20 MG/5ML solution Take 2.5 mLs (10 mg total) by mouth daily. 120 mL 1   ibuprofen (ADVIL) 600 MG tablet Take 1 tablet (600 mg total) by mouth every 6 (six) hours as needed. (Patient not taking: Reported on 06/25/2021) 30 tablet 0   KYLEENA 19.5 MG IUD 1 Device by Intrauterine route once for 1 dose. 1 Intra Uterine Device 0   Multiple Vitamin (MULTIVITAMIN) tablet Take 1 tablet by mouth daily.     mupirocin ointment (BACTROBAN) 2 % Apply 1 application topically 2 (two) times daily. 22 g 0   traZODone (DESYREL) 50 MG tablet Take 1 tablet (50 mg total) by mouth at bedtime. Take 75-100mg  nightly. 60 tablet 2   No current facility-administered medications for this visit.     Musculoskeletal: Strength & Muscle Tone: within normal limits Gait & Station: normal Patient leans: N/A  Psychiatric Specialty Exam: Review of Systems  There were no vitals taken for this visit.There is no height or weight on file to calculate BMI.  General Appearance: Casual  Eye Contact:  Poor  Speech:  NA  Volume:   Defer  Mood:   Happy objectively patient appears almost excited, patient is nonverbal but mom confirms that she also believes patient is happy and in a good mood  Affect:  Congruent  Thought Process:  NA  Orientation:  NA  Thought Content: NA   Suicidal Thoughts:   N/A  Homicidal Thoughts:  N/A  Memory:  N/A  Judgement:  N/A  Insight:  N/A  Psychomotor Activity:  Restlessness   Concentration:  N/A  Recall:  N/A  Fund of Knowledge: N/A  Language: Poor  Akathisia:  Yes    AIMS (if indicated): not  done  Assets:  Social Support  ADL's:  Impaired  Cognition: Impaired,  Severe  Sleep:  Fair   Screenings: Flowsheet Row ED from 10/03/2020 in [facility]   C-SSRS RISK CATEGORY No Risk        Assessment and Plan: Arien Morine is a 21 year old patient with a history of autism, skin picking excoriation disorder, tic, developmental delay and akathisia of unknown etiology.  Per chart review and mom, patient appears to have decreased excoriating behaviors when she is on her current prescribed medication regimen.  Provider may be becoming more aware of patient's behaviors, but patient did appear to be more restless today.  Patient is seen very preoccupied with wanting to put her hands on things and does appear to attempt to follow instructions when asked to not pick at her skin.  This is noted by patient putting her hands on her shirt collar multiple times before she begins waving her hands and moving her fingers and eventually getting back to picking at her skin.  On discussion with mom, there is concern that this was repetitive behaviors secondary to autism however as the interview continued it became more concerning that patient seemed restless overall and more hyperactive than at previous appointments.  Patient spent less time looking out the window as she had prior and seemed to have less control.  At this time it was decided to leave patient's Abilify and Prozac as they are prescribed at 5 mg twice daily and going back to 2 mL of Prozac.  There is some concern that patient displays hypomanic symptoms in the past when on SSRIs, patient's restless behavior may be secondary to small increase in Prozac.  Patient also has a diagnosis of akathisia of unknown etiology.  Discussion was had with mom about Cogentin versus propranolol however ultimately due to  the fact that it is difficult to get vitals, it was decided to try Cogentin.  Cogentin may also benefit regarding patient's nocturnal enuresis.  Autism Skin picking excoriation disorder Tics - Continue Abilify 5 mg twice daily - Continue trazodone 100 mg nightly - Continue Prozac 10 mg (liquid form), for skin picking excoriation and other repetitive behaviors  - This provider is fine prescribing clonidine 0.1 mg twice daily and N-acetylcysteine 1200 mg twice daily - Recommended maintenance at home - F/U in 4 weeks   Hx akathisia, etiology unknown -- Start Cogentin 1 mg daily.  Collaboration of Care: Collaboration of Care:   Patient/Guardian was advised Release of Information must be obtained prior to any record release in order to collaborate their care with an outside provider. Patient/Guardian was advised if they have not already done so to contact the registration department to sign all necessary forms in order for [place] to release information regarding their care.   Consent: Patient/Guardian gives verbal consent for treatment and assignment of benefits for services provided during this visit. Patient/Guardian expressed understanding and agreed to proceed.   PGY-3 Bobbye Morton, MD 10/30/2021, 4:34 PM

## 2021-12-03 ENCOUNTER — Encounter (HOSPITAL_COMMUNITY): Payer: Medicaid Other | Admitting: Student in an Organized Health Care Education/Training Program

## 2021-12-18 ENCOUNTER — Encounter (HOSPITAL_COMMUNITY): Payer: Medicaid Other | Admitting: Student in an Organized Health Care Education/Training Program

## 2022-01-15 ENCOUNTER — Ambulatory Visit (INDEPENDENT_AMBULATORY_CARE_PROVIDER_SITE_OTHER): Payer: Medicaid Other | Admitting: Student in an Organized Health Care Education/Training Program

## 2022-01-15 ENCOUNTER — Encounter (HOSPITAL_COMMUNITY): Payer: Self-pay | Admitting: Student in an Organized Health Care Education/Training Program

## 2022-01-15 DIAGNOSIS — G2571 Drug induced akathisia: Secondary | ICD-10-CM

## 2022-01-15 DIAGNOSIS — F840 Autistic disorder: Secondary | ICD-10-CM | POA: Diagnosis not present

## 2022-01-15 MED ORDER — FLUOXETINE HCL 20 MG/5ML PO SOLN: 10.0000 mg | Freq: Every day | ORAL | 1 refills | Status: DC

## 2022-01-15 MED ORDER — TRAZODONE HCL 50 MG PO TABS: 50.0000 mg | ORAL_TABLET | Freq: Every day | ORAL | 2 refills | Status: DC

## 2022-01-15 MED ORDER — BENZTROPINE MESYLATE 1 MG PO TABS: 1.0000 mg | ORAL_TABLET | Freq: Every day | ORAL | 2 refills | Status: DC

## 2022-01-15 MED ORDER — CLONIDINE HCL 0.1 MG PO TABS: 0.1000 mg | ORAL_TABLET | Freq: Three times a day (TID) | ORAL | 5 refills | Status: DC

## 2022-01-15 MED ORDER — ARIPIPRAZOLE 5 MG PO TABS: 5.0000 mg | ORAL_TABLET | Freq: Two times a day (BID) | ORAL | 2 refills | Status: DC

## 2022-01-15 NOTE — Progress Notes (Signed)
[facility] MD/PA/NP OP Progress Note  01/15/2022 5:14 PM Gabrielle [name]  MRN:  [id number]  Chief Complaint:  Chief Complaint  Patient presents with   Follow-up   HPI:  Gabrielle [name] is a 21 year old patient with a history of autism, skin picking excoriation disorder, tic, developmental delay and akathisia of unknown etiology.  Patient presents today with her mother.  Per mother, patient is nonverbal at baseline.  Per mom, patient has not had any bedwetting since being on the Cogentin and decreasing her Prozac.  Patient's hypomanic behavior has also ceased.  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  Patient is also picking at her skin, with noticeable deep wounds on her right posterior leg around the knee, and in her creases of her arms bilaterally.  Mom reports that patient has had some changes recently including a new caregiver due to changes in the program.  Mom does not endorse any recent eating out of trash can however she has noticed that patient has been more impulsive when it comes to food.  Mom reports that patient is also not eating breakfast and appears to be significantly hungrier in the late afternoon due to this.  Objectively, patient appears to be back at her baseline.  Patient is happily eating the snacks her mom bought, spending time at the window and occasionally attempting to interact with provider and her mother in pleasant ways. Visit Diagnosis:    ICD-10-CM   1. Autism  F84.0 cloNIDine (CATAPRES) 0.1 MG tablet    ARIPiprazole (ABILIFY) 5 MG tablet    FLUoxetine (PROZAC) 20 MG/5ML solution    traZODone (DESYREL) 50 MG tablet    2. Akathisia  G25.71 benztropine (COGENTIN) 1 MG tablet      Past Psychiatric History: Last visit:    10/02/2021-patient was not having excoriating behaviors, medications were continued. 09/2021-patient started on Prozac 10 mg, liquid form and Abilify 10 mg daily  changed to 5 mg twice daily 10/30/2021-decrease Prozac back to 10 mg (2 mL), continued Abilify 5 mg twice daily, continue trazodone 100 mg nightly, started patient on Cogentin 1 mg daily   Patient used to be seen by Dr. Jannifer Franklin, was later transferred to Dr. Lorenz Coaster.  Patient was last seen by Dr. Lorenz Coaster (pediatric neurologist) 4/10 who endorsed that patient should return to psychiatric care due to complexity of presentation.   Previous medications include: Celexa (1 week trial, mom reports patient appeared to have trouble sleeping and a flatter affect), Lexapro, Luvox (failed), Zoloft, Focalin (became more agitated and dangerous to self) ropinirole, BuSpar, gabapentin, Trileptal, Lamictal, clonidine, Vistaril, NAC, Valium (prior to procedures), Klonopin, Versed (prior to procedures), Ativan, triazolam (prior to procedures)  Mom endorse some concerns that when patient has tried SSRIs in the past she appears to become more "manic."  When asked for further details Mom reports that patient will become more restless, have outbursts of giggling and her sleep worsens.  Per both parents patient has always had significant trouble sleeping until she was started on trazodone.  Medications at initial visit: Trazodone 100 mg (mom feels is over sedating but prefers 75 mg), Abilify 10 mg (treating agitation, irritability and impulsivity fairly well) clonidine 2 mg daily, NAC 2400 mg (per neurology for skin excoriation)  Past Medical History:  Past Medical History:  Diagnosis Date   Anemia    Anxiety    Autism disorder    per mother severe autism, very social,  good receptive languange but nonverbal (can do some sign languange) , high tolerence to pain   Dysmenorrhea    Menorrhagia    Nonverbal    PT AUTISTIC   PONV (postoperative nausea and vomiting)    per mother "when wakes up pt can go from 0 to 60 and starts taking off cords and try to sit up to stand, better when mother and father are  they when she wakes up"   Repetitive movement    due to autism---  chronic picking of own skin and body    Past Surgical History:  Procedure Laterality Date   DENTAL RESTORATION/EXTRACTION WITH X-RAY     DILATION AND CURETTAGE OF UTERUS N/A 04/07/2017   Procedure: DILATATION AND CURETTAGE;  Surgeon: Megan Salon, MD;  Location: [facility];  Service: Gynecology;  Laterality: N/A;   INTRAUTERINE DEVICE (IUD) INSERTION N/A 04/07/2017   Procedure: PLACEMENT OF Verdia Kuba  IUD;  Surgeon: Megan Salon, MD;  Location: [facility];  Service: Gynecology;  Laterality: N/A;   MASS EXCISION Left 06/15/2015   Procedure: EXCISION OF LEFT AXILLARY MASS/TETANUS INJECTION;  Surgeon: Wallace Going, DO;  Location: [place];  Service: Plastics;  Laterality: Left;    Family Psychiatric History:  Parents report some history of family members with depression    Family History:  Family History  Problem Relation Age of Onset   Heart disease Maternal Grandmother    Endometriosis Maternal Grandmother    Diabetes Maternal Grandfather    Cancer Maternal Grandfather        Skin Cancer   Asthma Mother    Hyperlipidemia Mother    Hypertension Paternal Grandfather    Hypertension Father    Hyperlipidemia Father    Diverticulosis Father    Arthritis Paternal Grandmother     Social History:  Social History   Socioeconomic History   Marital status: Single    Spouse name: Not on file   Number of children: Not on file   Years of education: Not on file   Highest education level: Not on file  Occupational History   Occupation: Armed forces training and education officer in [place]  Tobacco Use   Smoking status: Never   Smokeless tobacco: Never  Vaping Use   Vaping Use: Never used  Substance and Sexual Activity   Alcohol use: No   Drug use: No   Sexual activity: Never    Birth control/protection: None  Other Topics Concern   Not on file  Social History Narrative   Patient lives with  both parents. Gabrielle [name] graduated from [employer]. She is now at [place] specialist consult with mom once a month.    Therapies: Not at the moment.    Social Determinants of Health   Financial Resource Strain: Not on file  Food Insecurity: Not on file  Transportation Needs: Not on file  Physical Activity: Not on file  Stress: Not on file  Social Connections: Not on file    Allergies:  Allergies  Allergen Reactions   Cleaner [Ao-Sept Disinfection-Neutral] Hives    BRAND NAME - KABOOM    Metabolic Disorder Labs: Lab Results  Component Value Date   HGBA1C 5.4 08/11/2019   MPG 108.28 08/11/2019   No results found for: "PROLACTIN" Lab Results  Component Value Date   CHOL 165 08/11/2019   TRIG 61 08/11/2019   HDL 51 08/11/2019   CHOLHDL 3.2 08/11/2019   VLDL  12 08/11/2019   LDLCALC 102 (H) 08/11/2019   Lab Results  Component Value Date   TSH 2.188 08/11/2019    Therapeutic Level Labs: No results found for: "LITHIUM" No results found for: "VALPROATE" No results found for: "CBMZ"  Current Medications: Current Outpatient Medications  Medication Sig Dispense Refill   Acetylcysteine (N-ACETYL-L-CYSTEINE) 600 MG CAPS Take 1,200 mg by mouth in the morning and at bedtime. 120 capsule 5   ARIPiprazole (ABILIFY) 5 MG tablet Take 1 tablet (5 mg total) by mouth 2 (two) times daily. 60 tablet 2   benztropine (COGENTIN) 1 MG tablet Take 1 tablet (1 mg total) by mouth daily. 60 tablet 2   cloNIDine (CATAPRES) 0.1 MG tablet Take 1 tablet (0.1 mg total) by mouth 3 (three) times daily. 120 tablet 5   FLUoxetine (PROZAC) 20 MG/5ML solution Take 2.5 mLs (10 mg total) by mouth daily. 120 mL 1   ibuprofen (ADVIL) 600 MG tablet Take 1 tablet (600 mg total) by mouth every 6 (six) hours as needed. (Patient not taking: Reported on 06/25/2021) 30 tablet 0   KYLEENA 19.5 MG IUD 1 Device by Intrauterine route once for 1 dose. 1 Intra Uterine Device 0   Multiple  Vitamin (MULTIVITAMIN) tablet Take 1 tablet by mouth daily.     mupirocin ointment (BACTROBAN) 2 % Apply 1 application topically 2 (two) times daily. 22 g 0   traZODone (DESYREL) 50 MG tablet Take 1 tablet (50 mg total) by mouth at bedtime. Take 75-100mg  nightly. 60 tablet 2   No current facility-administered medications for this visit.     Musculoskeletal: Strength & Muscle Tone: within normal limits Gait & Station: normal Patient leans: N/A  Psychiatric Specialty Exam: Review of Systems  There were no vitals taken for this visit.There is no height or weight on file to calculate BMI.  General Appearance: Casual and excoriations as reported above as well as trichotillomania as reported above  Eye Contact:  Poor  Speech:  NA  Volume:      Mood:   Objectively appears to be in a "good mood"  Affect:  NA  Thought Process:  NA  Orientation:  NA  Thought Content: NA   Suicidal Thoughts:   Defer  Homicidal Thoughts: Defer  Memory: Defer  Judgement: Defer  Insight: Defer/none  Psychomotor Activity:  Increased but baseline  Concentration:  Concentration: Poor  Recall:  NA  Fund of Knowledge: Poor  Language:  Is able to sign for when she wants food today as per baseline  Akathisia:   Appears to be back at baseline  Handed:    AIMS (if indicated):   Assets:  Housing Social Support Transportation  ADL's:  Impaired  Cognition: Impaired,  Severe  Sleep:  Good   Screenings: Flowsheet Row ED from 10/03/2020 in [facility] at [facility]   C-SSRS RISK CATEGORY No Risk        Assessment and Plan:  Gabrielle [name] is a 21 year old patient with a history of autism, skin picking excoriation disorder, tic, developmental delay and akathisia of unknown etiology.  On assessment today, patient appears to be back at her baseline.  Patient also appears to no longer be having bedwetting and is not eating animal food or garbage which is a significant improvement.  However, patient  is again exhibiting skin excoriation as well as trichotillomania-repetitive behaviors that are likely secondary to her ASD.  After discussion with attending, we will increase patient's clonidine to 3 times daily.  If patient continues to have these behaviors we will consider increasing NAC to 2400 mg twice daily.  In the last resort would be to start patient on memantine at 5 mg to address these behaviors.  Autism Skin picking excoriation disorder Tics - Continue Abilify 5 mg twice daily - Continue trazodone 100 mg nightly - Continue Prozac 10 mg (liquid form), for skin picking excoriation and other repetitive behaviors -Start clonidine 0.1mg  3 times daily, increased from twice daily - Continue N-acetylcysteine 1200 mg twice daily - Recommended maintenance at home - F/U in 4 weeks   Hx akathisia, etiology unknown -- Start Cogentin 1 mg daily.  Collaboration of Care: Collaboration of Care:   Patient/Guardian was advised Release of Information must be obtained prior to any record release in order to collaborate their care with an outside provider. Patient/Guardian was advised if they have not already done so to contact the registration department to sign all necessary forms in order for [place] to release information regarding their care.   Consent: Patient/Guardian gives verbal consent for treatment and assignment of benefits for services provided during this visit. Patient/Guardian expressed understanding and agreed to proceed.   PGY-3 Bobbye Morton, MD 01/15/2022, 5:14 PM

## 2022-01-25 NOTE — Progress Notes (Signed)
Patient: Gabrielle [name] MRN: [id number] Sex: female DOB: 06-Apr-2000  Provider: Lorenz Coaster, MD Location of Care: [facility] - Child Neurology  Note type: Routine follow-up  History of Present Illness:  Gabrielle [name] is a 21 y.o. female with history of autism, developmental delay, tic disorder with compulsive picking, and akathesia of unknown etiology who I am seeing for routine follow-up. Patient was last seen on 10/22/21 where I refilled clonidine and acetylcysteine.  Since the last appointment, she has continued to follow up wit Dr. Morrie Sheldon for psychiatry who has also agreed to continue prescribing clonidine and acetylcysteine.  Patient presents today with her mom who reports that her skin picking has worsened recently. Dermatology did not provide many options to address wounds. Generally, treats them with Vaseline but if it starts to look infected she will put bactrim on it.  She reports that they recently started cogentin and increased clonidine, but since these changes no improvement in the picking. Generally, mom feels that there are a lot of medications that are not helping her behaviors. Is hesitant about continuing to increase medications. Notices that her hyperactivity and behavioral outbursts are better but no improvement in the picking.   Sleeping well, no longer wetting the bed.   She wonders if it would be possible for her to get sensory integration therapy from OT.   Struggling with staffing with aids from [facility], getting only one hour a week.   Mom reports she tested positive for covid-19 on Monday 01/21/22. Had a fever the weekend before. Tested negative yesterday 01/27/22.  Past Medical History Past Medical History:  Diagnosis Date   Anemia    Anxiety    Autism disorder    per mother severe autism, very social,  good receptive languange but nonverbal (can do some sign languange) , high tolerence to pain   Dysmenorrhea    Menorrhagia    Nonverbal     PT AUTISTIC   PONV (postoperative nausea and vomiting)    per mother "when wakes up pt can go from 0 to 60 and starts taking off cords and try to sit up to stand, better when mother and father are they when she wakes up"   Repetitive movement    due to autism---  chronic picking of own skin and body    Surgical History Past Surgical History:  Procedure Laterality Date   DENTAL RESTORATION/EXTRACTION WITH X-RAY     DILATION AND CURETTAGE OF UTERUS N/A 04/07/2017   Procedure: DILATATION AND CURETTAGE;  Surgeon: Jerene Bears, MD;  Location: [facility];  Service: Gynecology;  Laterality: N/A;   INTRAUTERINE DEVICE (IUD) INSERTION N/A 04/07/2017   Procedure: PLACEMENT OF Rutha Bouchard  IUD;  Surgeon: Jerene Bears, MD;  Location: [facility];  Service: Gynecology;  Laterality: N/A;   MASS EXCISION Left 06/15/2015   Procedure: EXCISION OF LEFT AXILLARY MASS/TETANUS INJECTION;  Surgeon: Peggye Form, DO;  Location: [facility];  Service: Plastics;  Laterality: Left;    Family History family history includes Arthritis in her paternal grandmother; Asthma in her mother; Cancer in her maternal grandfather; Diabetes in her maternal grandfather; Diverticulosis in her father; Endometriosis in her maternal grandmother; Heart disease in her maternal grandmother; Hyperlipidemia in her father and mother; Hypertension in her father and paternal grandfather.   Social History Social History   Social History Narrative   Patient lives with both parents. Gabrielle [name] graduated from [employer]. She is now  at [employer]       Behavior specialist consult with mom once a month.    Therapies: Not at the moment.     Allergies Allergies  Allergen Reactions   Cleaner [Ao-Sept Disinfection-Neutral] Hives    BRAND NAME - KABOOM    Medications Current Outpatient Medications on File Prior to Visit  Medication Sig Dispense Refill   Acetylcysteine  (N-ACETYL-L-CYSTEINE) 600 MG CAPS Take 1,200 mg by mouth in the morning and at bedtime. 120 capsule 5   ARIPiprazole (ABILIFY) 5 MG tablet Take 1 tablet (5 mg total) by mouth 2 (two) times daily. 60 tablet 2   benztropine (COGENTIN) 1 MG tablet Take 1 tablet (1 mg total) by mouth daily. 60 tablet 2   cloNIDine (CATAPRES) 0.1 MG tablet Take 1 tablet (0.1 mg total) by mouth 3 (three) times daily. 120 tablet 5   FLUoxetine (PROZAC) 20 MG/5ML solution Take 2.5 mLs (10 mg total) by mouth daily. 120 mL 1   ibuprofen (ADVIL) 600 MG tablet Take 1 tablet (600 mg total) by mouth every 6 (six) hours as needed. 30 tablet 0   Multiple Vitamin (MULTIVITAMIN) tablet Take 1 tablet by mouth daily.     mupirocin ointment (BACTROBAN) 2 % Apply 1 application topically 2 (two) times daily. 22 g 0   traZODone (DESYREL) 50 MG tablet Take 1 tablet (50 mg total) by mouth at bedtime. Take 75-100mg  nightly. 60 tablet 2   KYLEENA 19.5 MG IUD 1 Device by Intrauterine route once for 1 dose. 1 Intra Uterine Device 0   No current facility-administered medications on file prior to visit.   The medication list was reviewed and reconciled. All changes or newly prescribed medications were explained.  A complete medication list was provided to the patient/caregiver.  Physical Exam BP 112/64 (BP Location: Right Arm, Patient Position: Sitting, Cuff Size: Normal)   Ht 5' 5.95" (1.675 m)   Wt 181 lb 3.2 oz (82.2 kg)   BMI 29.30 kg/m  Facility age limit for growth %iles is 20 years.  No results found. Gen: well appearing child Skin: Crusty red rash along multiple patches of skin.  Hair thinned and broken in back of head.  HEENT: Normocephalic, no dysmorphic features, no conjunctival injection, nares patent, mucous membranes moist, oropharynx clear. Neck: Supple, no meningismus. No focal tenderness. Resp: Clear to auscultation bilaterally CV: Regular rate, normal S1/S2, no murmurs, no rubs Abd: BS present, abdomen soft,  non-tender, non-distended. No hepatosplenomegaly or mass Ext: Warm and well-perfused. No deformities, no muscle wasting, ROM full.  Neurological Examination: MS: Awake, alert, interactive. Poor eye contact, nonverbal, speech was fluent.  Poor attention in room, paces and self stims thorughout visit.  Cranial Nerves: Pupils were equal and reactive to light;  EOM normal, no nystagmus; no ptsosis, no double vision, intact facial sensation, face symmetric with full strength of facial muscles, hearing intact grossly.  Motor-Normal tone throughout, Normal strength in all muscle groups. No abnormal movements Reflexes- Reflexes not tested today.  Sensation: Intact to light touch throughout.   Coordination: No dysmetria with reaching for objects Gait: Normal gait.    Diagnosis: 1. Autism   2. Compulsive skin picking   3. Developmental disability      Assessment and Plan Gabrielle [name] is a 21 y.o. female with history of autism, developmental delay, tic disorder with compulsive picking, and akathesia of unknown etiology who I am seeing in follow-up. Patient aggression is improved, however, she continues to have picking events. I  am concerned about cause of the picking and I worry that there may be an underlying skin condition. Will refer to [facility] dermatology for assessment of this. I also discussed with mom referral to the CIDD for assessment of her psychiatric and medical needs which interact significantly for this patient. Discussed the benefit of sensory integration therapy from OT for this patient as well. Explained that now that she is an adult it can be difficult to qualify her for continuous therapies under medicaid, however, provided list of therapists for mom to call to determine what services they may be able to offer. I also agreed to order routine lab work to be completed with IUD replacement while she is sedated.   - Referred to [facility] dermatology  - Referred to [facility]  - Provided list of OT  companies  - Ordered routine labs for when sedated for IUD replacement   I spent 33 minutes on day of service on this patient including review of chart, discussion with patient and family, discussion of screening results, coordination with other providers and management of orders and paperwork.     Return in about 1 year (around 01/29/2023).  I, Mayra Reel, scribed for and in the presence of Lorenz Coaster, MD at today's visit on 01/28/2022.  I, Lorenz Coaster MD MPH, personally performed the services described in this documentation, as scribed by Mayra Reel in my presence on 01/28/2022 and it is accurate, complete, and reviewed by me.    Lorenz Coaster MD MPH Neurology and Neurodevelopment [facility]  [address] [place], [place], [place] [address] Phone: [phone] Fax: [phone]

## 2022-01-28 ENCOUNTER — Ambulatory Visit (INDEPENDENT_AMBULATORY_CARE_PROVIDER_SITE_OTHER): Payer: Medicaid Other | Admitting: Pediatrics

## 2022-01-28 ENCOUNTER — Encounter (INDEPENDENT_AMBULATORY_CARE_PROVIDER_SITE_OTHER): Payer: Self-pay | Admitting: Pediatrics

## 2022-01-28 VITALS — BP 112/64 | Ht 65.95 in | Wt 181.2 lb

## 2022-01-28 DIAGNOSIS — F840 Autistic disorder: Secondary | ICD-10-CM

## 2022-01-28 DIAGNOSIS — F424 Excoriation (skin-picking) disorder: Secondary | ICD-10-CM | POA: Diagnosis not present

## 2022-01-28 DIAGNOSIS — F89 Unspecified disorder of psychological development: Secondary | ICD-10-CM

## 2022-01-28 NOTE — Patient Instructions (Addendum)
I referred to [facility] dermatology today. Phone: [phone] I will also refer to the [facility] of Developmental Delay (CIDD). Phone: [phone] I will order her labs to be completed with her [facility] replacement   List of Occupational Therapists I know:  [facility] [phone]  [facility] [phone]  [facility]:   [phone]   [facility]: [phone]  [facility] [phone] Therapy [phone]  [facility] [phone]  [facility] Laster [phone] [place] Location) [phone]  [facility] [phone]  [facility] [phone]  [facility]  [phone]  [facility] [phone]

## 2022-02-04 ENCOUNTER — Encounter (INDEPENDENT_AMBULATORY_CARE_PROVIDER_SITE_OTHER): Payer: Self-pay | Admitting: Pediatrics

## 2022-02-15 ENCOUNTER — Encounter (HOSPITAL_COMMUNITY): Payer: Self-pay | Admitting: Student in an Organized Health Care Education/Training Program

## 2022-02-15 ENCOUNTER — Ambulatory Visit (INDEPENDENT_AMBULATORY_CARE_PROVIDER_SITE_OTHER): Payer: Medicaid Other | Admitting: Student in an Organized Health Care Education/Training Program

## 2022-02-15 DIAGNOSIS — G2571 Drug induced akathisia: Secondary | ICD-10-CM

## 2022-02-15 DIAGNOSIS — F424 Excoriation (skin-picking) disorder: Secondary | ICD-10-CM

## 2022-02-15 DIAGNOSIS — F840 Autistic disorder: Secondary | ICD-10-CM

## 2022-02-15 MED ORDER — TRAZODONE HCL 50 MG PO TABS: 50.0000 mg | ORAL_TABLET | Freq: Every day | ORAL | 2 refills | Status: DC

## 2022-02-15 MED ORDER — CLONIDINE HCL 0.1 MG PO TABS: 0.1000 mg | ORAL_TABLET | Freq: Three times a day (TID) | ORAL | 5 refills | Status: DC

## 2022-02-15 MED ORDER — BENZTROPINE MESYLATE 0.5 MG PO TABS: 0.5000 mg | ORAL_TABLET | Freq: Every day | ORAL | 2 refills | Status: DC

## 2022-02-15 MED ORDER — ACETYLCYSTEINE 600 MG PO CAPS: 1200.0000 mg | ORAL_CAPSULE | Freq: Two times a day (BID) | ORAL | 3 refills | Status: DC

## 2022-02-15 MED ORDER — FLUOXETINE HCL 20 MG/5ML PO SOLN: 10.0000 mg | Freq: Every day | ORAL | 1 refills | Status: DC

## 2022-02-15 NOTE — Progress Notes (Signed)
BH MD/PA/NP OP Progress Note  02/15/2022 10:51 AM Gabrielle ARSENEAU  MRN:  [id number]  Chief Complaint:  Chief Complaint  Patient presents with   Follow-up   HPI: Gabrielle [name] is a 21 year old patient with a history of autism, skin picking excoriation disorder, tic, developmental delay and akathisia of unknown etiology.  Patient presents today with her mother.  Per mother, patient is nonverbal at baseline.  Patient is compliant with the following medication regimen: Abilify 5 mg twice daily NAC 1200 mg twice daily Trazodone 100 mg nightly Prozac 10 mg (liquid solution) Clonidine 0.1 mg 3 times daily Cogentin 1 mg daily  Since last visit, patient did see her pediatric neurologist who was concerned that patient may have a rash leading to worsening of her skin picking excoriation.  She was referred to the [facility] dermatologist as well as received increased resources for occupational therapy and assessment for autism.  On assessment today patient's mother reports that she has not had time to complete all the referrals but she believes that the [facility] of Developmental Delay (CIDD) referral for workup is most important.  She reports that she is also looking into evening programs in a day program for patient.  Mom endorses that she feels that patient's hyperactivity and outbursts have improved with patient's skin picking excoriation disorder is only worsened.  Patient has multiple sores that are starting to heal.  Mom endorses that patient's skin picking is undulating process where it worsens over time and then she stops and allows healing.  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  Mom reports that patient is also still not having bedwetting.  Mom endorses that she is concerned about repetitive mouth movements and noises that have been occurring and have not happened in the past before.  Mom reports she is slightly worried for TD.  Mom brought in  patient's augmentative communication device.  Patient is able to communicate that she was "frustrated" and was also able to communicate that she wanted the appointment to finish around the time he was scheduled to.  Objectively, provider noticed that patient appeared to like to play with her hood and found it comforting.  Patient has always enjoyed placing her hands around her neck and pulling on the top of her shirt.  Mom and provider discussed trying scarves to help decrease patient's picking at her chest and provide something for patient's hands to feel occupied.  Mom endorsed that she would be willing to try this.  Mom endorses that she is a bit hesitant about increasing any other medications, but is on board with decreasing Cogentin due to concern for TD. Visit Diagnosis:    ICD-10-CM   1. Autism  F84.0 cloNIDine (CATAPRES) 0.1 MG tablet    FLUoxetine (PROZAC) 20 MG/5ML solution    traZODone (DESYREL) 50 MG tablet    Acetylcysteine 600 MG CAPS    2. Akathisia  G25.71 benztropine (COGENTIN) 0.5 MG tablet    3. Compulsive skin picking  F42.4       Past Psychiatric History:   10/02/2021-patient was not having excoriating behaviors, medications were continued. 09/2021-patient started on Prozac 10 mg, liquid form and Abilify 10 mg daily changed to 5 mg twice daily 10/30/2021-decrease Prozac back to 10 mg (2 mL), continued Abilify 5 mg twice daily, continue trazodone 100 mg nightly, started patient on Cogentin 1 mg daily 01/15/2022: Increase clonidine to 0.1 mg 3 times daily due to worsening skin picking behaviors, continue  all other medications.  Patient was also no longer bedwetting with the addition of Cogentin 1 mg daily.  Patient no longer appeared hypomanic on this assessment, was no longer eating animal food or garbage.   Patient used to be seen by Dr. Darleene Cleaver, was later transferred to Dr. Carylon Perches.  Patient was last seen by Dr. Carylon Perches (pediatric neurologist) 4/10 who  endorsed that patient should return to psychiatric care due to complexity of presentation.   Previous medications include: Celexa (1 week trial, mom reports patient appeared to have trouble sleeping and a flatter affect), Lexapro, Luvox (failed), Zoloft, Focalin (became more agitated and dangerous to self) ropinirole, BuSpar, gabapentin, Trileptal, Lamictal, clonidine, Vistaril, NAC, Valium (prior to procedures), Klonopin, Versed (prior to procedures), Ativan, triazolam (prior to procedures)  Mom endorse some concerns that when patient has tried SSRIs in the past she appears to become more "manic."  When asked for further details Mom reports that patient will become more restless, have outbursts of giggling and her sleep worsens.  Per both parents patient has always had significant trouble sleeping until she was started on trazodone.  Medications at initial visit: Trazodone 100 mg (mom feels is over sedating but prefers 75 mg), Abilify 10 mg (treating agitation, irritability and impulsivity fairly well) clonidine 2 mg daily, NAC 2400 mg (per neurology for skin excoriation)    Past Medical History:  Past Medical History:  Diagnosis Date   Anemia    Anxiety    Autism disorder    per mother severe autism, very social,  good receptive languange but nonverbal (can do some sign languange) , high tolerence to pain   Dysmenorrhea    Menorrhagia    Nonverbal    PT AUTISTIC   PONV (postoperative nausea and vomiting)    per mother "when wakes up pt can go from 0 to 54 and starts taking off cords and try to sit up to stand, better when mother and father are they when she wakes up"   Repetitive movement    due to autism---  chronic picking of own skin and body    Past Surgical History:  Procedure Laterality Date   DENTAL RESTORATION/EXTRACTION WITH X-RAY     DILATION AND CURETTAGE OF UTERUS N/A 04/07/2017   Procedure: DILATATION AND CURETTAGE;  Surgeon: Megan Salon, MD;  Location: [facility];  Service: Gynecology;  Laterality: N/A;   INTRAUTERINE DEVICE (IUD) INSERTION N/A 04/07/2017   Procedure: PLACEMENT OF Verdia Kuba  IUD;  Surgeon: Megan Salon, MD;  Location: [facility];  Service: Gynecology;  Laterality: N/A;   MASS EXCISION Left 06/15/2015   Procedure: EXCISION OF LEFT AXILLARY MASS/TETANUS INJECTION;  Surgeon: Wallace Going, DO;  Location: [facility];  Service: Plastics;  Laterality: Left;    Family Psychiatric History: Parents report some history of family members with depression   Family History:  Family History  Problem Relation Age of Onset   Heart disease Maternal Grandmother    Endometriosis Maternal Grandmother    Diabetes Maternal Grandfather    Cancer Maternal Grandfather        Skin Cancer   Asthma Mother    Hyperlipidemia Mother    Hypertension Paternal Grandfather    Hypertension Father    Hyperlipidemia Father    Diverticulosis Father    Arthritis Paternal Grandmother     Social History:  Social History   Socioeconomic History   Marital status: Single    Spouse name:  Not on file   Number of children: Not on file   Years of education: Not on file   Highest education level: Not on file  Occupational History   Occupation: [employer] in [place]  Tobacco Use   Smoking status: Never   Smokeless tobacco: Never  Vaping Use   Vaping Use: Never used  Substance and Sexual Activity   Alcohol use: No   Drug use: No   Sexual activity: Never    Birth control/protection: None  Other Topics Concern   Not on file  Social History Narrative   Patient lives with both parents. Lyndee Leo graduated from [employer]. She is now at [place] specialist consult with mom once a month.    Therapies: Not at the moment.    Social Determinants of Health   Financial Resource Strain: Not on file  Food Insecurity: Not on file  Transportation Needs: Not on file  Physical Activity:  Not on file  Stress: Not on file  Social Connections: Not on file    Allergies:  Allergies  Allergen Reactions   Cleaner [Ao-Sept Disinfection-Neutral] Hives    BRAND NAME - KABOOM    Metabolic Disorder Labs: Lab Results  Component Value Date   HGBA1C 5.4 08/11/2019   MPG 108.28 08/11/2019   No results found for: "PROLACTIN" Lab Results  Component Value Date   CHOL 165 08/11/2019   TRIG 61 08/11/2019   HDL 51 08/11/2019   CHOLHDL 3.2 08/11/2019   VLDL 12 08/11/2019   LDLCALC 102 (H) 08/11/2019   Lab Results  Component Value Date   TSH 2.188 08/11/2019    Therapeutic Level Labs: No results found for: "LITHIUM" No results found for: "VALPROATE" No results found for: "CBMZ"  Current Medications: Current Outpatient Medications  Medication Sig Dispense Refill   Acetylcysteine 600 MG CAPS Take 2 capsules (1,200 mg total) by mouth 2 (two) times daily. 120 capsule 3   ARIPiprazole (ABILIFY) 5 MG tablet Take 1 tablet (5 mg total) by mouth 2 (two) times daily. 60 tablet 2   benztropine (COGENTIN) 0.5 MG tablet Take 1 tablet (0.5 mg total) by mouth daily. 60 tablet 2   cloNIDine (CATAPRES) 0.1 MG tablet Take 1 tablet (0.1 mg total) by mouth 3 (three) times daily. 120 tablet 5   FLUoxetine (PROZAC) 20 MG/5ML solution Take 2.5 mLs (10 mg total) by mouth daily. 120 mL 1   ibuprofen (ADVIL) 600 MG tablet Take 1 tablet (600 mg total) by mouth every 6 (six) hours as needed. 30 tablet 0   KYLEENA 19.5 MG IUD 1 Device by Intrauterine route once for 1 dose. 1 Intra Uterine Device 0   Multiple Vitamin (MULTIVITAMIN) tablet Take 1 tablet by mouth daily.     mupirocin ointment (BACTROBAN) 2 % Apply 1 application topically 2 (two) times daily. 22 g 0   traZODone (DESYREL) 50 MG tablet Take 1 tablet (50 mg total) by mouth at bedtime. Take 75-100mg  nightly. 60 tablet 2   No current facility-administered medications for this visit.     Musculoskeletal: Strength & Muscle Tone: within  normal limits Gait & Station: normal Patient leans: N/A  Psychiatric Specialty Exam: Review of Systems  There were no vitals taken for this visit.There is no height or weight on file to calculate BMI.  General Appearance: Bizarre wearing tights for the first time on a visit  Eye Contact:  Poor  Speech:  NA did use her  augmentative communication device appropriately, when offered  Volume:  Increased  Mood:   Communicated she was "frustrated" using the advise  Affect:  Congruent.  Rubbing her hands are on her nose a lot today, mom endorsed that patient was congested  Thought Process:  NA  Orientation:  NA  Thought Content: NA   Suicidal Thoughts:  N/A  Homicidal Thoughts:   N/A  Memory:  NA  Judgement:  NA  Insight:  NA  Psychomotor Activity:  Increased  Concentration:  Concentration: Poor  Recall:  NA  Fund of Knowledge: Poor  Language: Poor  Akathisia:  NA  Handed:    AIMS (if indicated): not done difficult movements of mouth, appears to be mostly bottom jaw, does have a sound associated with it  Assets:  Resilience Social Support  ADL's:  Impaired  Cognition: Impaired,  Severe  Sleep:  Fair   Screenings: [place] [facility] from 10/03/2020 in [facility] [place]        Assessment and Plan:  Saddie Vago is a 21 year old patient with a history of autism, skin picking excoriation disorder, tic, developmental delay and akathisia of unknown etiology.  Patient presents today with her mother.  Based on assessment today patient's current medication regimen has significantly helped with impulsive, hyperactive, and outburst behaviors.  However patient continues to have extreme skin picking excoriation.  Patient had multiple scars and sores on her today however she seemed to be picking at them less.  The most concern was patient's new stereotypical behaviors involving her mouth and making a noise.  Concern the patient may be  developing TD with Abilify, we will decrease patient's Cogentin as this may worsen the movements is truly related to TD.  Spoke with mom about patient may begin bedwetting again with decrease in Cogentin.  In regards to patient's skin picking, suggested trying to use a scarf during the colder months to help provide patient with stimulation while also allowing her chest and neck to heal, which appear to be the patient's primary spots for skin picking.  It appears that patient's pediatric neurologist was concerned the patient was picking at her skin due to a rash however, mom is adamant that what appears to be rashes actually healing skin.  Autism Skin picking excoriation disorder Tics - Continue Abilify 5 mg twice daily - Continue trazodone 100 mg nightly - Continue Prozac 10 mg (liquid form), for skin picking excoriation and other repetitive behaviors - Continue clonidine 0.1mg  3 times daily, increased from twice daily - Continue N-acetylcysteine 1200 mg twice daily - Recommended trying to use a scarf or stimulation around the neck and chest - F/U in 4 weeks -Mom looking for new care facilities and OT, using referrals from pediatric neurologist   Hx akathisia, etiology unknown Concern for TD --Decrease Cogentin to 0.5 mg daily      Collaboration of Care: Collaboration of Care:   Patient/Guardian was advised Release of Information must be obtained prior to any record release in order to collaborate their care with an outside provider. Patient/Guardian was advised if they have not already done so to contact the registration department to sign all necessary forms in order for [place] to release information regarding their care.   Consent: Patient/Guardian gives verbal consent for treatment and assignment of benefits for services provided during this visit. Patient/Guardian expressed understanding and agreed to proceed.   PGY-3 Freida Busman, MD 02/15/2022, 10:51 AM

## 2022-03-15 ENCOUNTER — Ambulatory Visit (INDEPENDENT_AMBULATORY_CARE_PROVIDER_SITE_OTHER): Payer: Medicaid Other | Admitting: Student in an Organized Health Care Education/Training Program

## 2022-03-15 ENCOUNTER — Encounter (HOSPITAL_COMMUNITY): Payer: Self-pay | Admitting: Student in an Organized Health Care Education/Training Program

## 2022-03-15 DIAGNOSIS — F840 Autistic disorder: Secondary | ICD-10-CM

## 2022-03-15 MED ORDER — ARIPIPRAZOLE 5 MG PO TABS: 5.0000 mg | ORAL_TABLET | Freq: Two times a day (BID) | ORAL | 2 refills | Status: DC

## 2022-03-15 MED ORDER — FLUOXETINE HCL 20 MG/5ML PO SOLN: 10.0000 mg | Freq: Every day | ORAL | 1 refills | Status: DC

## 2022-03-15 MED ORDER — TRAZODONE HCL 50 MG PO TABS: 50.0000 mg | ORAL_TABLET | Freq: Every day | ORAL | 2 refills | Status: DC

## 2022-03-15 MED ORDER — CLONIDINE HCL 0.1 MG PO TABS: 0.1000 mg | ORAL_TABLET | Freq: Three times a day (TID) | ORAL | 5 refills | Status: DC

## 2022-03-15 NOTE — Progress Notes (Signed)
BH MD/PA/NP OP Progress Note  03/15/2022 9:08 AM Gabrielle [name]  MRN:  [id number]  Chief Complaint:  Chief Complaint  Patient presents with   Follow-up   HPI:  Gabrielle [name] is a 21 year old patient with a history of autism, skin picking excoriation disorder, tic, developmental delay and akathisia of unknown etiology.  Patient presents today with her mother and father.  Per mother, patient is nonverbal at baseline.   Patient is compliant with the following medication regimen: Abilify 5 mg twice daily NAC 1200 mg twice daily Trazodone 100 mg nightly Prozac 10 mg (liquid solution) Clonidine 0.1 mg 3 times daily Cogentin 0.5 mg daily   On assessment today patient patient is irritable and tearful.  Mom reports that since last visit, patient has not had any bedwetting, continues to have abnormal mouth movements, and continues to have skin picking.  Patient's neck has sores that travel longitudinally down her neck where she rubs her fingers throughout assessment.  Mom reports that the first week out from last appointment, patient was at her baseline.  Mom reports that the second week patient preferred to stay indoors, keeping to herself and reading and was overall calm.  Mom reports that in the third week patient returned back to her baseline and was going back outside.  Mom reports that in the fourth week patient has been more aggressive.  Mom reports that she believes that this is related to patient will likely begin menstruating soon.  Mom reports that she is also thinking that this episode may be worse because patient's Rutha Bouchard will need to be replaced in early January.  Mom reports that she has used patient's device, and patient has been able to consistently communicate she is having abdominal pain.  Dad and mom endorsed that when patient received pain medication her behavior somewhat improved.  Mom and dad report that unfortunately patient has become so agitated that she was sent home from school  once, and this has never happened in the past.  Despite continued skin picking, patient is no longer pulling at her hair as frequently.  Patient's school has endorsed that she is exposing herself more and has asked that she, in jumpers do this behavior.   Objectively, patient did not appear as happy as usual today.  Patient was appearing frustrated, tearful, and occasionally grunting and screaming.  Patient was also not as easily distracted by food and at some points refused the snacks that she usually enjoys.  Mom reports that in relation to patient's menstrual cycle, it usually occurs every 3 to 5 weeks.  Mom reports that she is aware that patient in the past had 31 mm uterine lining and required a D&C before her last Rutha Bouchard was implanted.  Mom reports that she does think that patient's behaviors drastically impaired due to premenstrual pain.  Mom reports that normally, patient will indicate some level of pain, but has resolution as soon as her period starts. Visit Diagnosis:    ICD-10-CM   1. Autism  F84.0 ARIPiprazole (ABILIFY) 5 MG tablet    cloNIDine (CATAPRES) 0.1 MG tablet    FLUoxetine (PROZAC) 20 MG/5ML solution    traZODone (DESYREL) 50 MG tablet      Past Psychiatric History:   10/02/2021-patient was not having excoriating behaviors, medications were continued. 09/2021-patient started on Prozac 10 mg, liquid form and Abilify 10 mg daily changed to 5 mg twice daily 10/30/2021-decrease Prozac back to 10 mg (2 mL), continued Abilify 5 mg twice daily, continue trazodone  100 mg nightly, started patient on Cogentin 1 mg daily 01/15/2022: Increase clonidine to 0.1 mg 3 times daily due to worsening skin picking behaviors, continue all other medications.  Patient was also no longer bedwetting with the addition of Cogentin 1 mg daily.  Patient no longer appeared hypomanic on this assessment, was no longer eating animal food or garbage. 02/15/2022: Mom concern for possible TD like movements with  the mouth, decrease patient's Cogentin to 0.5 mg daily also suggested trying a scarf as patient appears to like picking at her neck and hanging onto her shirts and [name] of her dresses.  Continue all other medications.   Patient used to be seen by Dr. Jannifer Franklin, was later transferred to Dr. Lorenz Coaster.  Patient was last seen by Dr. Lorenz Coaster (pediatric neurologist) 4/10 who endorsed that patient should return to psychiatric care due to complexity of presentation.   Previous medications include: Celexa (1 week trial, mom reports patient appeared to have trouble sleeping and a flatter affect), Lexapro, Luvox (failed), Zoloft, Focalin (became more agitated and dangerous to self) ropinirole, BuSpar, gabapentin, Trileptal, Lamictal, clonidine, Vistaril, NAC, Valium (prior to procedures), Klonopin, Versed (prior to procedures), Ativan, triazolam (prior to procedures)  Mom endorse some concerns that when patient has tried SSRIs in the past she appears to become more "manic."  When asked for further details Mom reports that patient will become more restless, have outbursts of giggling and her sleep worsens.  Per both parents patient has always had significant trouble sleeping until she was started on trazodone.  Medications at initial visit: Trazodone 100 mg (mom feels is over sedating but prefers 75 mg), Abilify 10 mg (treating agitation, irritability and impulsivity fairly well) clonidine 2 mg daily, NAC 2400 mg (per neurology for skin excoriation)   Past Medical History:  Past Medical History:  Diagnosis Date   Anemia    Anxiety    Autism disorder    per mother severe autism, very social,  good receptive languange but nonverbal (can do some sign languange) , high tolerence to pain   Dysmenorrhea    Menorrhagia    Nonverbal    PT AUTISTIC   PONV (postoperative nausea and vomiting)    per mother "when wakes up pt can go from 0 to 60 and starts taking off cords and try to sit up to stand,  better when mother and father are they when she wakes up"   Repetitive movement    due to autism---  chronic picking of own skin and body    Past Surgical History:  Procedure Laterality Date   DENTAL RESTORATION/EXTRACTION WITH X-RAY     DILATION AND CURETTAGE OF UTERUS N/A 04/07/2017   Procedure: DILATATION AND CURETTAGE;  Surgeon: Jerene Bears, MD;  Location: [facility];  Service: Gynecology;  Laterality: N/A;   INTRAUTERINE DEVICE (IUD) INSERTION N/A 04/07/2017   Procedure: PLACEMENT OF Rutha Bouchard  IUD;  Surgeon: Jerene Bears, MD;  Location: [facility];  Service: Gynecology;  Laterality: N/A;   MASS EXCISION Left 06/15/2015   Procedure: EXCISION OF LEFT AXILLARY MASS/TETANUS INJECTION;  Surgeon: Peggye Form, DO;  Location: [facility];  Service: Plastics;  Laterality: Left;    Family Psychiatric History: Parents report some history of family members with depression    Family History:  Family History  Problem Relation Age of Onset   Heart disease Maternal Grandmother    Endometriosis Maternal Grandmother    Diabetes Maternal Grandfather  Cancer Maternal Grandfather        Skin Cancer   Asthma Mother    Hyperlipidemia Mother    Hypertension Paternal Grandfather    Hypertension Father    Hyperlipidemia Father    Diverticulosis Father    Arthritis Paternal Grandmother     Social History:  Social History   Socioeconomic History   Marital status: Single    Spouse name: Not on file   Number of children: Not on file   Years of education: Not on file   Highest education level: Not on file  Occupational History   Occupation: Medical illustrator in [place]  Tobacco Use   Smoking status: Never   Smokeless tobacco: Never  Vaping Use   Vaping Use: Never used  Substance and Sexual Activity   Alcohol use: No   Drug use: No   Sexual activity: Never    Birth control/protection: None  Other Topics Concern   Not on file  Social History  Narrative   Patient lives with both parents. [name] graduated from [employer]. She is now at [employer]       Behavior specialist consult with mom once a month.    Therapies: Not at the moment.    Social Determinants of Health   Financial Resource Strain: Not on file  Food Insecurity: Not on file  Transportation Needs: Not on file  Physical Activity: Not on file  Stress: Not on file  Social Connections: Not on file    Allergies:  Allergies  Allergen Reactions   Cleaner [Ao-Sept Disinfection-Neutral] Hives    BRAND NAME - KABOOM    Metabolic Disorder Labs: Lab Results  Component Value Date   HGBA1C 5.4 08/11/2019   MPG 108.28 08/11/2019   No results found for: "PROLACTIN" Lab Results  Component Value Date   CHOL 165 08/11/2019   TRIG 61 08/11/2019   HDL 51 08/11/2019   CHOLHDL 3.2 08/11/2019   VLDL 12 08/11/2019   LDLCALC 102 (H) 08/11/2019   Lab Results  Component Value Date   TSH 2.188 08/11/2019    Therapeutic Level Labs: No results found for: "LITHIUM" No results found for: "VALPROATE" No results found for: "CBMZ"  Current Medications: Current Outpatient Medications  Medication Sig Dispense Refill   Acetylcysteine 600 MG CAPS Take 2 capsules (1,200 mg total) by mouth 2 (two) times daily. 120 capsule 3   ARIPiprazole (ABILIFY) 5 MG tablet Take 1 tablet (5 mg total) by mouth 2 (two) times daily. 60 tablet 2   cloNIDine (CATAPRES) 0.1 MG tablet Take 1 tablet (0.1 mg total) by mouth 3 (three) times daily. 120 tablet 5   FLUoxetine (PROZAC) 20 MG/5ML solution Take 2.5 mLs (10 mg total) by mouth daily. Can take 63mL in the week before period, but decrease back to 2.53mL dose when period starts 120 mL 1   ibuprofen (ADVIL) 600 MG tablet Take 1 tablet (600 mg total) by mouth every 6 (six) hours as needed. 30 tablet 0   KYLEENA 19.5 MG IUD 1 Device by Intrauterine route once for 1 dose. 1 Intra Uterine Device 0   Multiple Vitamin (MULTIVITAMIN)  tablet Take 1 tablet by mouth daily.     mupirocin ointment (BACTROBAN) 2 % Apply 1 application topically 2 (two) times daily. 22 g 0   traZODone (DESYREL) 50 MG tablet Take 1 tablet (50 mg total) by mouth at bedtime. Take 75-100mg  nightly. 60 tablet 2   No current facility-administered medications for this visit.  Musculoskeletal: Strength & Muscle Tone: within normal limits Gait & Station: normal Patient leans: N/A  Psychiatric Specialty Exam: Review of Systems  Gastrointestinal:  Positive for abdominal pain.  Psychiatric/Behavioral:  Positive for agitation, behavioral problems and dysphoric mood.     There were no vitals taken for this visit.There is no height or weight on file to calculate BMI.  General Appearance: Casual  Eye Contact:  Poor  Speech:  NA  Volume:  Increased  Mood:  Irritable  Affect:  Congruent and tearful  Thought Process:  NA  Orientation:  NA  Thought Content: NA   Suicidal Thoughts: Not able to communicate  Homicidal Thoughts: Nonverbal  Memory:  NA  Judgement:  NA  Insight:  NA  Psychomotor Activity:  Increased  Concentration: Defer  Recall:  NA  Fund of Knowledge: NA  Language: NA  Akathisia:  Yes  Handed:    AIMS (if indicated): not done  Assets:  Resilience Social Support  ADL's:  Impaired  Cognition: Impaired,  Severe  Sleep:  Fair   Screenings: Flowsheet Row [facility]   C-SSRS RISK CATEGORY No Risk        Assessment and Plan:  Gabrielle KubaClaire Nakama is a 21 year old patient with a history of autism, skin picking excoriation disorder, tic, developmental delay and akathisia of unknown etiology.  Based on assessment today, patient is much more irritable and tearful.  Patient's mother describes symptoms concerning for PMDD however patient is nonverbal making it difficult to assess how many physical pains and mood changes patient is having.  Regardless, objectively patient is irritable and  unhappy.  Mom and provider came to agreement to increase patient's Prozac to 20 mg or 5 mL until her period arrives.  Mom reports that she would rather risk patient becoming hypomanic than continue with the behaviors that are likely related to menstruation.  Patient continues to have skin excoriation, but both parents endorse wish to see if patient improves with replacement of Kyleena in the next few weeks.  Did discuss with mom consideration for starting memantine in the future.  Due to continued concern for TD mouth movements, which were not as noticeable to provided today-but mom continues to endorse seeing, we will discontinue Cogentin.  Discussed with mom that patient's bedwetting may come back however it is also possible the patient's bedwetting is another cyclical behavior that is not occurring at this time.  Autism Skin picking excoriation disorder Tics - Continue Abilify 5 mg twice daily - Continue trazodone 100 mg nightly - Continue Prozac 10 mg (liquid form), for skin picking excoriation and other repetitive behaviors (week before.  Increase to 20 mg or 5 mL-then decrease back to 10 mg / 2.5 mL once period starts) - Continue clonidine 0.1mg  3 times daily, increased from twice daily - Continue N-acetylcysteine 1200 mg twice daily - Recommended trying to use a scarf or stimulation around the neck and chest - F/U in 4 weeks -Mom looking for new care facilities and OT, using referrals from pediatric neurologist   Hx akathisia, etiology unknown Concern for TD --The Cogentin at this time  Collaboration of Care: Collaboration of Care:   Patient/Guardian was advised Release of Information must be obtained prior to any record release in order to collaborate their care with an outside provider. Patient/Guardian was advised if they have not already done so to contact the registration department to sign all necessary forms in order for us to release information  regarding their care.   Consent:  Patient/Guardian gives verbal consent for treatment and assignment of benefits for services provided during this visit. Patient/Guardian expressed understanding and agreed to proceed.   PGY-3 Bobbye Morton, MD 03/15/2022, 9:08 AM

## 2022-03-27 ENCOUNTER — Telehealth (HOSPITAL_BASED_OUTPATIENT_CLINIC_OR_DEPARTMENT_OTHER): Payer: Self-pay

## 2022-03-27 NOTE — Telephone Encounter (Signed)
LMOM for caretaker to contact the office about an appointment. tbw

## 2022-04-02 NOTE — Telephone Encounter (Signed)
Patient coming in 04/11/2022. tbw

## 2022-04-08 ENCOUNTER — Encounter (HOSPITAL_BASED_OUTPATIENT_CLINIC_OR_DEPARTMENT_OTHER): Payer: Self-pay

## 2022-04-08 ENCOUNTER — Other Ambulatory Visit (HOSPITAL_BASED_OUTPATIENT_CLINIC_OR_DEPARTMENT_OTHER): Payer: Self-pay | Admitting: Obstetrics & Gynecology

## 2022-04-08 ENCOUNTER — Other Ambulatory Visit (INDEPENDENT_AMBULATORY_CARE_PROVIDER_SITE_OTHER): Payer: Self-pay | Admitting: Pediatrics

## 2022-04-08 DIAGNOSIS — R632 Polyphagia: Secondary | ICD-10-CM

## 2022-04-08 DIAGNOSIS — Z79899 Other long term (current) drug therapy: Secondary | ICD-10-CM

## 2022-04-08 DIAGNOSIS — N920 Excessive and frequent menstruation with regular cycle: Secondary | ICD-10-CM

## 2022-04-08 DIAGNOSIS — F840 Autistic disorder: Secondary | ICD-10-CM

## 2022-04-08 DIAGNOSIS — Z975 Presence of (intrauterine) contraceptive device: Secondary | ICD-10-CM

## 2022-04-09 ENCOUNTER — Telehealth (INDEPENDENT_AMBULATORY_CARE_PROVIDER_SITE_OTHER): Payer: Self-pay

## 2022-04-09 NOTE — Telephone Encounter (Signed)
Contacted pt's mother. Verified patients name and DOB as well as moms name.  Mom stated that she is going to call patients psychiatrist to see if they would like to add any tests. Mom will call back with an answer after she speaks to patients psychiatrist.   Mom also confirmed tests that are ordered.  SS, CCMA

## 2022-04-10 ENCOUNTER — Other Ambulatory Visit (HOSPITAL_BASED_OUTPATIENT_CLINIC_OR_DEPARTMENT_OTHER): Payer: Self-pay | Admitting: Obstetrics & Gynecology

## 2022-04-11 ENCOUNTER — Ambulatory Visit (INDEPENDENT_AMBULATORY_CARE_PROVIDER_SITE_OTHER): Payer: Medicaid Other | Admitting: Obstetrics & Gynecology

## 2022-04-11 ENCOUNTER — Telehealth (HOSPITAL_COMMUNITY): Payer: Self-pay | Admitting: *Deleted

## 2022-04-11 VITALS — Ht 66.0 in | Wt 183.0 lb

## 2022-04-11 DIAGNOSIS — F840 Autistic disorder: Secondary | ICD-10-CM | POA: Diagnosis not present

## 2022-04-11 DIAGNOSIS — F424 Excoriation (skin-picking) disorder: Secondary | ICD-10-CM | POA: Diagnosis not present

## 2022-04-11 DIAGNOSIS — Z8742 Personal history of other diseases of the female genital tract: Secondary | ICD-10-CM

## 2022-04-11 DIAGNOSIS — F89 Unspecified disorder of psychological development: Secondary | ICD-10-CM

## 2022-04-11 DIAGNOSIS — L989 Disorder of the skin and subcutaneous tissue, unspecified: Secondary | ICD-10-CM

## 2022-04-11 NOTE — Telephone Encounter (Signed)
Called back, thanks.

## 2022-04-11 NOTE — Telephone Encounter (Signed)
Patient mother called stating that they have the chance to draw blood on patient today while she is in sedation and would like to know what labs the MD would like drawn. Asking for return call at [phone].

## 2022-04-14 ENCOUNTER — Encounter (HOSPITAL_BASED_OUTPATIENT_CLINIC_OR_DEPARTMENT_OTHER): Payer: Self-pay | Admitting: Obstetrics & Gynecology

## 2022-04-14 DIAGNOSIS — Z8742 Personal history of other diseases of the female genital tract: Secondary | ICD-10-CM | POA: Insufficient documentation

## 2022-04-14 NOTE — Progress Notes (Signed)
GYNECOLOGY  VISIT  CC:   discuss procedure  HPI: 22 y.o. G0P0000 Single White or Caucasian female here for discussion of procedure.  Pt is non verbal and is accompanied by her mother.  Has Verdia Kuba IUD and has been five years since prior one was placed 04/07/2017.  Bleeding has increased the past year.  Pt is not SA.  Procedure discussed with Mrs. Verdi--will plan pap smear, IUD removal and replacement.  Will plan to place [facility] unless when sounding the uterus, there appears to be a change from when this was done at the prior placement.  Will also plan to proceed under ultrasound guidance.  Risks of bleeding, infection, uterine perforation with procedure that would be needed to treat this.  Have already communicated with Dr. Rogers Blocker and lab work has already been ordered.  Pt's mother is wondering if dental care or other things could be done at this time.  She is going to reach out to their dentist.    Pt's mother has a place on pt's breast she'd like me to look at today.  Looks like a skin lesion at this point that appears is healing.   Past Medical History:  Diagnosis Date   Anemia    Anxiety    Autism disorder    per mother severe autism, very social,  good receptive languange but nonverbal (can do some sign languange) , high tolerence to pain   Dysmenorrhea    Menorrhagia    Nonverbal    PT AUTISTIC   PONV (postoperative nausea and vomiting)    per mother "when wakes up pt can go from 0 to 24 and starts taking off cords and try to sit up to stand, better when mother and father are they when she wakes up"   Repetitive movement    due to autism---  chronic picking of own skin and body    MEDS:   Current Outpatient Medications on File Prior to Visit  Medication Sig Dispense Refill   Acetylcysteine 600 MG CAPS Take 2 capsules (1,200 mg total) by mouth 2 (two) times daily. 120 capsule 3   ARIPiprazole (ABILIFY) 5 MG tablet Take 1 tablet (5 mg total) by mouth 2 (two) times daily. 60 tablet 2    cloNIDine (CATAPRES) 0.1 MG tablet Take 1 tablet (0.1 mg total) by mouth 3 (three) times daily. 120 tablet 5   FLUoxetine (PROZAC) 20 MG/5ML solution Take 2.5 mLs (10 mg total) by mouth daily. Can take 53mL in the week before period, but decrease back to 2.76mL dose when period starts 120 mL 1   ibuprofen (ADVIL) 600 MG tablet Take 1 tablet (600 mg total) by mouth every 6 (six) hours as needed. 30 tablet 0   Multiple Vitamin (MULTIVITAMIN) tablet Take 1 tablet by mouth daily.     mupirocin ointment (BACTROBAN) 2 % Apply 1 application topically 2 (two) times daily. 22 g 0   traZODone (DESYREL) 50 MG tablet Take 1 tablet (50 mg total) by mouth at bedtime. Take 75-100mg  nightly. 60 tablet 2   KYLEENA 19.5 MG IUD 1 Device by Intrauterine route once for 1 dose. 1 Intra Uterine Device 0   No current facility-administered medications on file prior to visit.    ALLERGIES: Cleaner [ao-sept disinfection-neutral]  SH:  single, non smoker  Review of Systems  Constitutional: Negative.   Genitourinary: Negative.     PHYSICAL EXAMINATION:    Ht 5\' 6"  (1.676 m)   Wt 183 lb (83 kg)  BMI 29.54 kg/m     General appearance: alert, cooperative and appears stated age Neck: no adenopathy, supple, symmetrical, trachea midline and thyroid normal to inspection and palpation CV:  Regular rate and rhythm Lungs:  clear to auscultation, no wheezes, rales or rhonchi, symmetric air entry Breast: erythematous skin lesion with some scale present   Assessment/Plan: 1. History of menorrhagia - will plan to remove and replace IUD under ultrasound guidance - will also obtain pap smear  2. Developmental disability - lab work has been placed by Dr. Rogers Blocker  3. Autism  4. Compulsive skin picking  5. Skin lesion - will check breast lesion and if needed, can do biopsy in OR   Total time with pt and mother:  22 minutes

## 2022-04-16 ENCOUNTER — Encounter (HOSPITAL_COMMUNITY): Payer: Self-pay

## 2022-04-16 ENCOUNTER — Encounter (HOSPITAL_COMMUNITY): Payer: Medicaid Other | Admitting: Student in an Organized Health Care Education/Training Program

## 2022-04-17 ENCOUNTER — Other Ambulatory Visit (HOSPITAL_COMMUNITY): Payer: Self-pay | Admitting: Obstetrics & Gynecology

## 2022-04-17 DIAGNOSIS — Z3049 Encounter for surveillance of other contraceptives: Secondary | ICD-10-CM

## 2022-04-18 ENCOUNTER — Encounter (HOSPITAL_BASED_OUTPATIENT_CLINIC_OR_DEPARTMENT_OTHER): Payer: Self-pay | Admitting: Obstetrics & Gynecology

## 2022-04-18 NOTE — Progress Notes (Addendum)
Addendum:  Called and spoke w/ pt's mother via phone.  Mother stated pt symptoms improved and pt had negative home covid test.  Ok to proceed.   Spoke w/ via phone for pre-op interview--- pt's mother, Gabrielle [name].  Pt has severe autisim/ nonverbal Lab needs dos----  none in pre-op  (but Dr Sabra Heck will be putting lab orders in to be drawn in OR when pt under anesthesia           Lab results------ no COVID test ----- per mother patient has head congestion , cough w/ at times productive clear, and low grade fever Friday 04-14-2022 and Tuesday night 04-16-2022.  Mother states she will do covid test this evening.   Arrive at ------- 4540 on 04-23-2022 NPO after MN NO  Med rec completed Medications to take morning of surgery -----  none  (since pt has to take pills with cool whip) Diabetic medication ----- Patient instructed no nail polish to be worn day of surgery Patient instructed to bring photo id and insurance card day of surgery Patient aware to have Driver (ride ) / caregiver    for 24 hours after surgery -- both parents Patient Special Instructions ----- n/a Pre-Op special Istructions ----- will be reviewed pt symptoms with anesthesia.  To call mother back tomorrow am to let her know what anesthesia says. Patient verbalized understanding of instructions that were given at this phone interview. Patient denies shortness of breath, chest pain, fever, cough at this phone interview.  Pt is very social , good receptive to language but nonverbal, and chronic picks at skin/ body.  Plan of care day of surgery:  Pt surroundings need to be calm and quiet. Both parents to be with pt in pre-op area.  Mother suggest liquid versed 30 min prior to surgery this will calm pt .  Also, father will need to go with pt into the OR to keep pt calm until asleep (father will need attire for the OR).  Mother states pt wakes from anesthesia from 0 to 13 and will start taking cords off and sit up to stand, is very strong.  For recovery, mother suggest pt does great when both parents are there before she wakes up, they can calm her to keep her from pulling and getting up.

## 2022-04-22 ENCOUNTER — Encounter (HOSPITAL_BASED_OUTPATIENT_CLINIC_OR_DEPARTMENT_OTHER): Payer: Self-pay | Admitting: Obstetrics & Gynecology

## 2022-04-22 NOTE — Anesthesia Preprocedure Evaluation (Addendum)
Anesthesia Evaluation  Patient identified by MRN, date of birth, ID band Patient awake    Reviewed: Allergy & Precautions, H&P , NPO status , Patient's Chart, lab work & pertinent test results  History of Anesthesia Complications (+) PONV and history of anesthetic complications  Airway Mallampati: Unable to assess       Dental no notable dental hx. (+) Teeth Intact, Dental Advisory Given   Pulmonary neg pulmonary ROS   Pulmonary exam normal breath sounds clear to auscultation       Cardiovascular negative cardio ROS  Rhythm:Regular Rate:Normal     Neuro/Psych   Anxiety     negative neurological ROS     GI/Hepatic negative GI ROS, Neg liver ROS,,,  Endo/Other  negative endocrine ROS    Renal/GU negative Renal ROS  negative genitourinary   Musculoskeletal   Abdominal   Peds  Hematology  (+) Blood dyscrasia, anemia   Anesthesia Other Findings   Reproductive/Obstetrics negative OB ROS                             Anesthesia Physical Anesthesia Plan  ASA: 2  Anesthesia Plan: General   Post-op Pain Management: Ofirmev IV (intra-op)* and Toradol IV (intra-op)*   Induction: Inhalational  PONV Risk Score and Plan: 4 or greater and Ondansetron, Dexamethasone and Midazolam  Airway Management Planned: LMA  Additional Equipment:   Intra-op Plan:   Post-operative Plan: Extubation in OR  Informed Consent: I have reviewed the patients History and Physical, chart, labs and discussed the procedure including the risks, benefits and alternatives for the proposed anesthesia with the patient or authorized representative who has indicated his/her understanding and acceptance.     Dental advisory given  Plan Discussed with: CRNA  Anesthesia Plan Comments:        Anesthesia Quick Evaluation

## 2022-04-23 ENCOUNTER — Other Ambulatory Visit: Payer: Self-pay

## 2022-04-23 ENCOUNTER — Encounter (HOSPITAL_BASED_OUTPATIENT_CLINIC_OR_DEPARTMENT_OTHER)
Admission: RE | Disposition: A | Discharge status: Home / Self Care | Payer: Self-pay | Source: Home / Self Care | Attending: Obstetrics & Gynecology

## 2022-04-23 ENCOUNTER — Ambulatory Visit (HOSPITAL_BASED_OUTPATIENT_CLINIC_OR_DEPARTMENT_OTHER): Payer: Medicaid Other | Admitting: Anesthesiology

## 2022-04-23 ENCOUNTER — Ambulatory Visit (HOSPITAL_COMMUNITY)
Admission: RE | Admit: 2022-04-23 | Discharge: 2022-04-23 | Disposition: A | Discharge status: Home / Self Care | Payer: Medicaid Other | Source: Ambulatory Visit | Attending: Obstetrics & Gynecology | Admitting: Obstetrics & Gynecology

## 2022-04-23 ENCOUNTER — Ambulatory Visit (HOSPITAL_COMMUNITY): Payer: Medicaid Other

## 2022-04-23 ENCOUNTER — Ambulatory Visit (HOSPITAL_BASED_OUTPATIENT_CLINIC_OR_DEPARTMENT_OTHER)
Admission: RE | Admit: 2022-04-23 | Discharge: 2022-04-23 | Disposition: A | Discharge status: Home / Self Care | Payer: Medicaid Other | Attending: Obstetrics & Gynecology | Admitting: Obstetrics & Gynecology

## 2022-04-23 ENCOUNTER — Other Ambulatory Visit (HOSPITAL_COMMUNITY): Admission: RE | Admit: 2022-04-23 | Payer: Medicaid Other | Source: Ambulatory Visit

## 2022-04-23 ENCOUNTER — Encounter (HOSPITAL_BASED_OUTPATIENT_CLINIC_OR_DEPARTMENT_OTHER): Payer: Self-pay | Admitting: Obstetrics & Gynecology

## 2022-04-23 DIAGNOSIS — Z30430 Encounter for insertion of intrauterine contraceptive device: Secondary | ICD-10-CM

## 2022-04-23 DIAGNOSIS — Z30432 Encounter for removal of intrauterine contraceptive device: Secondary | ICD-10-CM | POA: Insufficient documentation

## 2022-04-23 DIAGNOSIS — Z01818 Encounter for other preprocedural examination: Secondary | ICD-10-CM

## 2022-04-23 DIAGNOSIS — F419 Anxiety disorder, unspecified: Secondary | ICD-10-CM | POA: Insufficient documentation

## 2022-04-23 DIAGNOSIS — Z8742 Personal history of other diseases of the female genital tract: Secondary | ICD-10-CM | POA: Diagnosis not present

## 2022-04-23 DIAGNOSIS — Z3049 Encounter for surveillance of other contraceptives: Secondary | ICD-10-CM | POA: Insufficient documentation

## 2022-04-23 DIAGNOSIS — F840 Autistic disorder: Secondary | ICD-10-CM | POA: Diagnosis not present

## 2022-04-23 DIAGNOSIS — Z30433 Encounter for removal and reinsertion of intrauterine contraceptive device: Secondary | ICD-10-CM | POA: Diagnosis not present

## 2022-04-23 DIAGNOSIS — Z01419 Encounter for gynecological examination (general) (routine) without abnormal findings: Secondary | ICD-10-CM | POA: Insufficient documentation

## 2022-04-23 HISTORY — PX: OPERATIVE ULTRASOUND: SHX5996

## 2022-04-23 HISTORY — PX: IUD REMOVAL: SHX5392

## 2022-04-23 HISTORY — PX: INTRAUTERINE DEVICE (IUD) INSERTION: SHX5877

## 2022-04-23 LAB — COMPREHENSIVE METABOLIC PANEL
ALT: 13 U/L (ref 0–44)
AST: 18 U/L (ref 15–41)
Albumin: 4 g/dL (ref 3.5–5.0)
Alkaline Phosphatase: 58 U/L (ref 38–126)
Anion gap: 6 (ref 5–15)
BUN: 10 mg/dL (ref 6–20)
CO2: 24 mmol/L (ref 22–32)
Calcium: 8.7 mg/dL — ABNORMAL LOW (ref 8.9–10.3)
Chloride: 108 mmol/L (ref 98–111)
Creatinine, Ser: 0.61 mg/dL (ref 0.44–1.00)
GFR, Estimated: >60 mL/min (ref >60)
Glucose, Bld: 91 mg/dL (ref 70–99)
Potassium: 3.7 mmol/L (ref 3.5–5.1)
Sodium: 138 mmol/L (ref 135–145)
Total Bilirubin: 0.4 mg/dL (ref 0.3–1.2)
Total Protein: 7.6 g/dL (ref 6.5–8.1)

## 2022-04-23 LAB — LIPID PANEL
Cholesterol: 181 mg/dL (ref 0–200)
HDL: 31 mg/dL — ABNORMAL LOW (ref >40)
LDL Cholesterol: 136 mg/dL — ABNORMAL HIGH (ref 0–99)
Total CHOL/HDL Ratio: 5.8 RATIO
Triglycerides: 68 mg/dL (ref <150)
VLDL: 14 mg/dL (ref 0–40)

## 2022-04-23 LAB — CBC WITH DIFFERENTIAL/PLATELET
Abs Immature Granulocytes: 0.02 10*3/uL (ref 0.00–0.07)
Basophils Absolute: 0 10*3/uL (ref 0.0–0.1)
Basophils Relative: 1 %
Eosinophils Absolute: 0.1 10*3/uL (ref 0.0–0.5)
Eosinophils Relative: 2 %
HCT: 39.1 % (ref 36.0–46.0)
Hemoglobin: 12.6 g/dL (ref 12.0–15.0)
Immature Granulocytes: 0 %
Lymphocytes Relative: 35 %
Lymphs Abs: 2.2 10*3/uL (ref 0.7–4.0)
MCH: 30.2 pg (ref 26.0–34.0)
MCHC: 32.2 g/dL (ref 30.0–36.0)
MCV: 93.8 fL (ref 80.0–100.0)
Monocytes Absolute: 0.5 10*3/uL (ref 0.1–1.0)
Monocytes Relative: 8 %
Neutro Abs: 3.4 10*3/uL (ref 1.7–7.7)
Neutrophils Relative %: 54 %
Platelets: 250 10*3/uL (ref 150–400)
RBC: 4.17 MIL/uL (ref 3.87–5.11)
RDW: 11.9 % (ref 11.5–15.5)
WBC: 6.2 10*3/uL (ref 4.0–10.5)
nRBC: 0 % (ref 0.0–0.2)

## 2022-04-23 LAB — HEMOGLOBIN A1C
Hgb A1c MFr Bld: 5.3 % (ref 4.8–5.6)
Mean Plasma Glucose: 105.41 mg/dL

## 2022-04-23 LAB — IRON AND TIBC
Iron: 85 ug/dL (ref 28–170)
Saturation Ratios: 27 % (ref 10.4–31.8)
TIBC: 311 ug/dL (ref 250–450)
UIBC: 226 ug/dL

## 2022-04-23 LAB — TSH: TSH: 2.564 u[IU]/mL (ref 0.350–4.500)

## 2022-04-23 LAB — T4, FREE: Free T4: 0.96 ng/dL (ref 0.61–1.12)

## 2022-04-23 LAB — FERRITIN: Ferritin: 80 ng/mL (ref 11–307)

## 2022-04-23 SURGERY — EXAM UNDER ANESTHESIA
Anesthesia: General | Site: Vagina

## 2022-04-23 MED ORDER — ONDANSETRON HCL 4 MG/2ML IJ SOLN
INTRAMUSCULAR | Status: AC
  Filled 2022-04-23: qty 2

## 2022-04-23 MED ORDER — MIDAZOLAM HCL (PF) 10 MG/2ML IJ SOLN: 10.0000 mg | Freq: Once | INTRAMUSCULAR | Status: DC

## 2022-04-23 MED ORDER — DEXAMETHASONE SODIUM PHOSPHATE 10 MG/ML IJ SOLN
INTRAMUSCULAR | Status: AC
  Filled 2022-04-23: qty 1

## 2022-04-23 MED ORDER — ONDANSETRON HCL 4 MG/2ML IJ SOLN
INTRAMUSCULAR | Status: DC | PRN
  Administered 2022-04-23: 4 mg via INTRAVENOUS

## 2022-04-23 MED ORDER — LACTATED RINGERS IV SOLN: INTRAVENOUS | Status: DC

## 2022-04-23 MED ORDER — LIDOCAINE HCL (PF) 2 % IJ SOLN
INTRAMUSCULAR | Status: AC
  Filled 2022-04-23: qty 5

## 2022-04-23 MED ORDER — LIDOCAINE HCL 1 % IJ SOLN
INTRAMUSCULAR | Status: DC | PRN
  Administered 2022-04-23: 10 mL

## 2022-04-23 MED ORDER — FENTANYL CITRATE (PF) 100 MCG/2ML IJ SOLN
INTRAMUSCULAR | Status: AC
  Filled 2022-04-23: qty 2

## 2022-04-23 MED ORDER — PROPOFOL 10 MG/ML IV BOLUS
INTRAVENOUS | Status: DC | PRN
  Administered 2022-04-23: 100 mg via INTRAVENOUS

## 2022-04-23 MED ORDER — FENTANYL CITRATE (PF) 100 MCG/2ML IJ SOLN: 25.0000 ug | INTRAMUSCULAR | Status: DC | PRN

## 2022-04-23 MED ORDER — MIDAZOLAM HCL 2 MG/ML PO SYRP
20.0000 mg | ORAL_SOLUTION | Freq: Once | ORAL | Status: AC
  Administered 2022-04-23: 20 mg via ORAL

## 2022-04-23 MED ORDER — POVIDONE-IODINE 10 % EX SWAB: 2.0000 | Freq: Once | CUTANEOUS | Status: DC

## 2022-04-23 MED ORDER — KETOROLAC TROMETHAMINE 30 MG/ML IJ SOLN
INTRAMUSCULAR | Status: DC | PRN
  Administered 2022-04-23: 30 mg via INTRAVENOUS

## 2022-04-23 MED ORDER — KETAMINE HCL 50 MG/5ML IJ SOSY
PREFILLED_SYRINGE | INTRAMUSCULAR | Status: AC
  Filled 2022-04-23: qty 5

## 2022-04-23 MED ORDER — KETOROLAC TROMETHAMINE 30 MG/ML IJ SOLN
INTRAMUSCULAR | Status: AC
  Filled 2022-04-23: qty 1

## 2022-04-23 MED ORDER — MIDAZOLAM HCL 2 MG/ML PO SYRP
ORAL_SOLUTION | ORAL | Status: AC
  Filled 2022-04-23: qty 10

## 2022-04-23 MED ORDER — DEXAMETHASONE SODIUM PHOSPHATE 10 MG/ML IJ SOLN
INTRAMUSCULAR | Status: DC | PRN
  Administered 2022-04-23: 5 mg via INTRAVENOUS

## 2022-04-23 MED ORDER — PROPOFOL 10 MG/ML IV BOLUS
INTRAVENOUS | Status: AC
  Filled 2022-04-23: qty 20

## 2022-04-23 MED ORDER — MIDAZOLAM HCL 2 MG/2ML IJ SOLN
INTRAMUSCULAR | Status: AC
  Filled 2022-04-23: qty 2

## 2022-04-23 MED ORDER — DEXMEDETOMIDINE HCL IN NACL 80 MCG/20ML IV SOLN
INTRAVENOUS | Status: AC
  Filled 2022-04-23: qty 20

## 2022-04-23 MED ORDER — FENTANYL CITRATE (PF) 100 MCG/2ML IJ SOLN
INTRAMUSCULAR | Status: DC | PRN
  Administered 2022-04-23: 25 ug via INTRAVENOUS

## 2022-04-23 MED ORDER — DEXMEDETOMIDINE HCL IN NACL 200 MCG/50ML IV SOLN
INTRAVENOUS | Status: DC | PRN
  Administered 2022-04-23: 8 ug via INTRAVENOUS

## 2022-04-23 MED ORDER — PHENYLEPHRINE 80 MCG/ML (10ML) SYRINGE FOR IV PUSH (FOR BLOOD PRESSURE SUPPORT)
PREFILLED_SYRINGE | INTRAVENOUS | Status: AC
  Filled 2022-04-23: qty 10

## 2022-04-23 MED ORDER — SILVER NITRATE-POT NITRATE 75-25 % EX MISC
CUTANEOUS | Status: DC | PRN
  Administered 2022-04-23: 1

## 2022-04-23 MED ORDER — PHENYLEPHRINE 80 MCG/ML (10ML) SYRINGE FOR IV PUSH (FOR BLOOD PRESSURE SUPPORT)
PREFILLED_SYRINGE | INTRAVENOUS | Status: DC | PRN
  Administered 2022-04-23 (×3): 160 ug via INTRAVENOUS

## 2022-04-23 MED ORDER — LEVONORGESTREL 19.5 MG IU IUD
1.0000 | INTRAUTERINE_SYSTEM | INTRAUTERINE | Status: AC
  Administered 2022-04-23: 1 via INTRAUTERINE
  Filled 2022-04-23 (×2): qty 1

## 2022-04-23 SURGICAL SUPPLY — 14 items
CATH ROBINSON RED A/P 16FR (CATHETERS) ×2 IMPLANT
DRSG TELFA 3X8 NADH STRL (GAUZE/BANDAGES/DRESSINGS) ×2 IMPLANT
GAUZE 4X4 16PLY ~~LOC~~+RFID DBL (SPONGE) ×2 IMPLANT
GLOVE BIO SURGEON STRL SZ 6.5 (GLOVE) ×2 IMPLANT
GLOVE BIOGEL PI IND STRL 7.0 (GLOVE) ×6 IMPLANT
GOWN STRL REUS W/TWL LRG LVL3 (GOWN DISPOSABLE) ×4 IMPLANT
HIBICLENS CHG 4% 4OZ BTL (MISCELLANEOUS) ×2 IMPLANT
KIT TURNOVER CYSTO (KITS) ×2 IMPLANT
Kyleena Levonorgestrel-releasing intrauterine syst IMPLANT
NDL SPNL 22GX3.5 QUINCKE BK (NEEDLE) ×2 IMPLANT
NEEDLE SPNL 22GX3.5 QUINCKE BK (NEEDLE) ×2 IMPLANT
PACK VAGINAL MINOR WOMEN LF (CUSTOM PROCEDURE TRAY) ×2 IMPLANT
PAD PREP 24X48 CUFFED NSTRL (MISCELLANEOUS) ×2 IMPLANT
TOWEL OR 17X26 10 PK STRL BLUE (TOWEL DISPOSABLE) ×4 IMPLANT

## 2022-04-23 NOTE — Anesthesia Procedure Notes (Addendum)
Procedure Name: LMA Insertion Date/Time: 04/23/2022 7:53 AM  Performed by: Rogers Blocker, CRNAPre-anesthesia Checklist: Patient identified, Emergency Drugs available, Suction available and Patient being monitored Patient Re-evaluated:Patient Re-evaluated prior to induction Oxygen Delivery Method: Circle System Utilized Preoxygenation: Pre-oxygenation with 100% oxygen Induction Type: Inhalational induction Ventilation: Mask ventilation without difficulty LMA: LMA inserted LMA Size: 4.0 Number of attempts: 1 Airway Equipment and Method: Bite block Placement Confirmation: positive ETCO2 Tube secured with: Tape Dental Injury: Teeth and Oropharynx as per pre-operative assessment

## 2022-04-23 NOTE — Op Note (Signed)
04/23/2022  8:45 AM  PATIENT:  Gabrielle [name]  22 y.o. female  PRE-OPERATIVE DIAGNOSIS:  Encounter for Collection of Pap Smear Encounter for Contracetpion Autism  POST-OPERATIVE DIAGNOSIS:  Encounter for Collection of Pap Smear Encounter for Contracetpion Autism  PROCEDURE:  Procedure(s): EXAM UNDER ANESTHESIA & PAP SMEAR INTRAUTERINE DEVICE (IUD) REMOVAL INTRAUTERINE DEVICE (IUD) INSERTION OPERATIVE ULTRASOUND  SURGEON:  Megan Salon  ASSISTANTS: OR staff.    ANESTHESIA:   general  ESTIMATED BLOOD LOSS: 5 mL  BLOOD ADMINISTERED:none   FLUIDS: 500cc LR  UOP: 200cc clear UOP  SPECIMEN:  none  DISPOSITION OF SPECIMEN:  N/A  FINDINGS: Small uterus, normal pelvic exam, thin endometrium on ultrasound, normal breast exam  DESCRIPTION OF OPERATION: Patient was taken to the operating room.  She is placed in the supine position. SCDs were on her lower extremities and functioning properly. General anesthesia with an LMA was administered without difficulty. Dr. Ola Spurr, anesthesia, oversaw case.  Legs were then placed in the Blanket in the low lithotomy position. The legs were lifted to the high lithotomy position and the Breast exam and pelvic exam under anesthesia was performed.  Blood work was obtained per orders from Dr. Rogers Blocker.  I did need to reenter these to be released from the OR but this was done without difficulty.  Speculum was placed and cervix visualized.  Pap smear obtained.  Ultrasound, intra-op, was present and in the room.  Ultrasound showed IUD in correct location with thin endometrium.  Then IUD string was grasped with ringed forceps and IUD removed easily with one pull.  Betadine prep was to prep the cervix x3.  A bivalve speculum was placed the vagina. The anterior lip of the cervix was grasped with single-tooth tenaculum.  A paracervical block of 1% lidocaine mixed one-to-one with epinephrine (1:100,000 units).  10 cc was used total. The endometrial cavity  sounded to 7 cm.  Then [place] IUD and introducer was passed to the fundus under ultrasound guidance.  The introducer was slightly removed and then IUD deployed without difficulty.  It was in the correct location with ultrasound guidance.  Introducer removed.  String cut to 2cm.  Tenaculum removed.  Silver nitrate used on tenaculum site for excellent hemostasis.  Speculum removed.  Foley catheterization was performed. Approximately 200 cc of clear urine was noted.   The legs are positioned back in the supine position. Sponge, lap, needle, instrument counts were correct x2. Patient was taken to recovery in stable condition.   COUNTS:  YES  PLAN OF CARE: Transfer to PACU

## 2022-04-23 NOTE — Progress Notes (Signed)
UPT ordered preop for patient due to being child bearing age and having period. Patient unable to give urine sample due to being in unknown environment. Dr. Sabra Heck aware.

## 2022-04-23 NOTE — H&P (Signed)
Gabrielle [name] is an 22 y.o. female G0 SWF with autism here for exam under anesthesia, IUD removal and replacement under ultrasound guidance if needed, Pap smear, and lab work.  Pt is non verbal and needs anesthesia for any testing to be done.  Her IUD is a little over 5 years and it is time for replacement.  Risks and benefits have been discussed in the office.  They are here and ready to proceed.  Pertinent Gynecological History: Menses:  regular Contraception: abstinence DES exposure: denies Blood transfusions: none Sexually transmitted diseases: no past history Previous GYN Procedures:  IUD placement   Last pap:  will be done today OB History: G0, P0   Menstrual History: Patient's last menstrual period was 04/19/2022 (exact date).    Past Medical History:  Diagnosis Date   Anemia    Anxiety    Autism disorder    per mother severe autism, very social,  good receptive languange but nonverbal (can do some sign languange) , high tolerence to pain   Dysmenorrhea    Menorrhagia    Nonverbal    PT AUTISTIC   PONV (postoperative nausea and vomiting)    per mother "when wakes up pt can go from 0 to 14 and starts taking off cords and try to sit up to stand, better when mother and father are they when she wakes up"   Repetitive movement    due to autism---  chronic picking of own skin and body    Past Surgical History:  Procedure Laterality Date   DENTAL RESTORATION/EXTRACTION WITH X-RAY     DILATION AND CURETTAGE OF UTERUS N/A 04/07/2017   Procedure: DILATATION AND CURETTAGE;  Surgeon: Megan Salon, MD;  Location: [facility];  Service: Gynecology;  Laterality: N/A;   INTRAUTERINE DEVICE (IUD) INSERTION N/A 04/07/2017   Procedure: PLACEMENT OF Verdia Kuba  IUD;  Surgeon: Megan Salon, MD;  Location: [facility];  Service: Gynecology;  Laterality: N/A;   MASS EXCISION Left 06/15/2015   Procedure: EXCISION OF LEFT AXILLARY MASS/TETANUS INJECTION;  Surgeon:  Wallace Going, DO;  Location: [facility];  Service: Plastics;  Laterality: Left;    Family History  Problem Relation Age of Onset   Heart disease Maternal Grandmother    Endometriosis Maternal Grandmother    Diabetes Maternal Grandfather    Cancer Maternal Grandfather        Skin Cancer   Asthma Mother    Hyperlipidemia Mother    Hypertension Paternal Grandfather    Hypertension Father    Hyperlipidemia Father    Diverticulosis Father    Arthritis Paternal Grandmother     Social History:  reports that she has never smoked. She has never used smokeless tobacco. She reports that she does not drink alcohol and does not use drugs.  Allergies:  Allergies  Allergen Reactions   Cleaner [Ao-Sept Disinfection-Neutral] Hives    BRAND NAME - KABOOM    Medications Prior to Admission  Medication Sig Dispense Refill Last Dose   Acetylcysteine 600 MG CAPS Take 2 capsules (1,200 mg total) by mouth 2 (two) times daily. 120 capsule 3    ARIPiprazole (ABILIFY) 5 MG tablet Take 1 tablet (5 mg total) by mouth 2 (two) times daily. 60 tablet 2    cloNIDine (CATAPRES) 0.1 MG tablet Take 1 tablet (0.1 mg total) by mouth 3 (three) times daily. (Patient taking differently: Take 0.1 mg by mouth 3 (three) times daily. AM /  1300/  1800) 120 tablet 5    FLUoxetine (PROZAC) 20 MG/5ML solution Take 2.5 mLs (10 mg total) by mouth daily. Can take 5mL in the week before period, but decrease back to 2.41mL dose when period starts 120 mL 1    ibuprofen (ADVIL) 200 MG tablet Take 400 mg by mouth every 6 (six) hours as needed for fever, headache or mild pain.      KYLEENA 19.5 MG IUD 1 Device by Intrauterine route once for 1 dose. 1 Intra Uterine Device 0    Multiple Vitamin (MULTIVITAMIN) tablet Take 1 tablet by mouth daily.      mupirocin ointment (BACTROBAN) 2 % Apply 1 application topically 2 (two) times daily. (Patient taking differently: Apply 1 application  topically 2 (two) times daily as  needed.) 22 g 0    traZODone (DESYREL) 50 MG tablet Take 1 tablet (50 mg total) by mouth at bedtime. Take 75-100mg  nightly. (Patient taking differently: Take 100 mg by mouth at bedtime.) 60 tablet 2     Review of Systems  All other systems reviewed and are negative.   Height 5\' 6"  (1.676 m), weight 81.6 kg, last menstrual period 04/19/2022. Physical Exam Constitutional:      Appearance: Normal appearance.  Cardiovascular:     Rate and Rhythm: Normal rate and regular rhythm.  Pulmonary:     Effort: Pulmonary effort is normal.     Breath sounds: Normal breath sounds.  Neurological:     Mental Status: She is alert. Mental status is at baseline.     No results found for this or any previous visit (from the past 24 hour(s)).  No results found.  Assessment/Plan: 22 yo G0 SWF with autism here for IUD removal and replacement, ultrasound guidance, pap smear, lab work, exam under anesthesia.  Pt and family here and ready to proceed.  Megan Salon 04/23/2022, 7:13 AM

## 2022-04-23 NOTE — Discharge Instructions (Addendum)
Post-surgical Instructions, Outpatient Surgery  You may expect to feel dizzy, weak, and drowsy for as long as 24 hours after receiving the medicine that made you sleep (anesthetic). For the first 24 hours after your surgery:   Do not drive a car, ride a bicycle, participate in physical activities, or take public transportation until you are done taking narcotic pain medicines or as directed by Dr. Sabra Heck.  Do not drink alcohol or take tranquilizers.  Do not take medicine that has not been prescribed by your physicians.  Do not sign important papers or make important decisions while on narcotic pain medicines.  Have a responsible person with you.   PAIN MANAGEMENT Motrin 800mg .  (This is the same as 4-200mg  over the counter tablets of Motrin or ibuprofen.)  You may take this every eight hours or as needed for cramping.    DO'S AND DON'T'S Do shower or bath whenever ready to do so. Do move around as you feel able.  Stairs are fine.  Physical activity is fine. Do eat whatever tastes good.   Do not put anything in the vagina for 24 hours.    REGULAR MEDIATIONS/VITAMINS: You may restart all of your regular medications as prescribed. You may restart any/all of your vitamins as you normally take them.    PLEASE CALL OR SEEK MEDICAL CARE IF: You have persistent nausea and vomiting.  You have trouble eating or drinking.  You have an oral temperature above 100.5.  You have constipation that is not helped by adjusting diet or increasing fluid intake. Pain medicines are a common cause of constipation.  You have heavy vaginal bleeding You have redness or drainage from your incision(s) or there is increasing pain or tenderness near or in the surgical site.   Postoperative Anesthesia Instructions-Pediatric  Activity: Your child should rest for the remainder of the day. A responsible individual must stay with your child for 24 hours.  Meals: Your child should start with liquids and light foods  such as gelatin or soup unless otherwise instructed by the physician. Progress to regular foods as tolerated. Avoid spicy, greasy, and heavy foods. If nausea and/or vomiting occur, drink only clear liquids such as apple juice or Pedialyte until the nausea and/or vomiting subsides. Call your physician if vomiting continues.  Special Instructions/Symptoms: Your child may be drowsy for the rest of the day, although some children experience some hyperactivity a few hours after the surgery. Your child may also experience some irritability or crying episodes due to the operative procedure and/or anesthesia. Your child's throat may feel dry or sore from the anesthesia or the breathing tube placed in the throat during surgery. Use throat lozenges, sprays, or ice chips if needed.  May begin Tylenol as needed for cramping/soreness.

## 2022-04-23 NOTE — Transfer of Care (Signed)
Immediate Anesthesia Transfer of Care Note  Patient: Gabrielle [name]  Procedure(s) Performed: Procedure(s) (LRB): EXAM UNDER ANESTHESIA & PAP SMEAR (N/A) INTRAUTERINE DEVICE (IUD) REMOVAL (N/A) INTRAUTERINE DEVICE (IUD) INSERTION (N/A) OPERATIVE ULTRASOUND (N/A)  Patient Location: PACU  Anesthesia Type: General  Level of Consciousness: awake, oriented, sedated and patient cooperative  Airway & Oxygen Therapy: Patient Spontanous Breathing and Patient connected to face mask oxygen  Post-op Assessment: Report given to PACU RN and Post -op Vital signs reviewed and stable  Post vital signs: Reviewed and stable  Complications: No apparent anesthesia complications Last Vitals:  Vitals Value Taken Time  BP    Temp 36.3 C 04/23/22 0824  Pulse 58 04/23/22 0827  Resp 13 04/23/22 0827  SpO2 100 % 04/23/22 0827  Vitals shown include unvalidated device data.  Last Pain: There were no vitals filed for this visit.       Complications: No notable events documented.

## 2022-04-23 NOTE — Anesthesia Postprocedure Evaluation (Signed)
Anesthesia Post Note  Patient: Gabrielle [name]  Procedure(s) Performed: EXAM UNDER ANESTHESIA & PAP SMEAR (Vagina ) INTRAUTERINE DEVICE (IUD) REMOVAL (Vagina ) INTRAUTERINE DEVICE (IUD) INSERTION (Vagina ) OPERATIVE ULTRASOUND (Abdomen)     Patient location during evaluation: PACU Anesthesia Type: General Level of consciousness: awake and alert Pain management: pain level controlled Vital Signs Assessment: post-procedure vital signs reviewed and stable Respiratory status: spontaneous breathing, nonlabored ventilation and respiratory function stable Cardiovascular status: blood pressure returned to baseline and stable Postop Assessment: no apparent nausea or vomiting Anesthetic complications: no  No notable events documented.  Last Vitals:  Vitals:   04/23/22 0845 04/23/22 0900  Pulse: 61 (!) 54  Resp: 16 16  Temp:    SpO2: 98% 95%    Last Pain:  Vitals:   04/23/22 0900  PainSc: 0-No pain                 Alhaji Mcneal,W. EDMOND

## 2022-04-24 ENCOUNTER — Encounter (HOSPITAL_BASED_OUTPATIENT_CLINIC_OR_DEPARTMENT_OTHER): Payer: Self-pay | Admitting: Obstetrics & Gynecology

## 2022-04-24 LAB — MISC LABCORP TEST (SEND OUT): Labcorp test code: 81950

## 2022-04-24 LAB — CYTOLOGY - PAP: Diagnosis: NEGATIVE

## 2022-04-25 LAB — PROLACTIN: Prolactin: 7.9 ng/mL (ref 4.8–33.4)

## 2022-04-25 LAB — T3: T3, Total: 148 ng/dL (ref 71–180)

## 2022-04-29 ENCOUNTER — Encounter (INDEPENDENT_AMBULATORY_CARE_PROVIDER_SITE_OTHER): Payer: Self-pay | Admitting: Pediatrics

## 2022-05-06 ENCOUNTER — Encounter: Payer: Self-pay | Admitting: *Deleted

## 2022-05-14 ENCOUNTER — Ambulatory Visit (INDEPENDENT_AMBULATORY_CARE_PROVIDER_SITE_OTHER): Payer: Medicaid Other | Admitting: Student in an Organized Health Care Education/Training Program

## 2022-05-14 DIAGNOSIS — F840 Autistic disorder: Secondary | ICD-10-CM | POA: Diagnosis not present

## 2022-05-14 MED ORDER — TRAZODONE HCL 50 MG PO TABS: 100.0000 mg | ORAL_TABLET | Freq: Every day | ORAL | 2 refills | Status: DC

## 2022-05-14 MED ORDER — CLONIDINE HCL 0.1 MG PO TABS: 0.1000 mg | ORAL_TABLET | Freq: Three times a day (TID) | ORAL | 2 refills | Status: DC

## 2022-05-14 MED ORDER — FLUOXETINE HCL 20 MG/5ML PO SOLN: 10.0000 mg | Freq: Every day | ORAL | 1 refills | Status: DC

## 2022-05-14 MED ORDER — ACETYLCYSTEINE 600 MG PO CAPS: 1200.0000 mg | ORAL_CAPSULE | Freq: Two times a day (BID) | ORAL | 3 refills | Status: DC

## 2022-05-14 MED ORDER — ARIPIPRAZOLE 5 MG PO TABS: 5.0000 mg | ORAL_TABLET | Freq: Two times a day (BID) | ORAL | 2 refills | Status: DC

## 2022-05-14 NOTE — Progress Notes (Signed)
[place] MD/PA/NP OP Progress Note  05/14/2022 10:06 AM [name]  MRN:  [id number]  Chief Complaint:  Chief Complaint  Patient presents with   Follow-up   HPI: Gabrielle [name] is a 22 year old patient with a history of autism, skin picking excoriation disorder, tic, developmental delay and akathisia of unknown etiology.  Patient presents today with her mother.  Per mother, patient is nonverbal at baseline.   Patient is compliant with the following medication regimen: Abilify 5 mg twice daily NAC 1200 mg twice daily Trazodone 100 mg nightly Prozac 10 mg (liquid solution) ('20mg'$  week before period if noted to be more aggressive and irritable) Clonidine 0.1 mg 3 times daily Cogentin 0.5 mg daily  Objectively, patient is in a much better mood this visit.  Patient appears to be at her baseline if not better.  Patient did not require any snacks during this visit and appeared to be happy wandering around the room and looking out the window.  Mom reports that patient got her new Kyleena without any problems.  She reports that patient had gone 3 weeks without any skin picking, and was able to undergo the procedure without any open wounds however the days she received her new IUD she began picking at her skin again.  Mom reports that she thinks that this may have just been a coincidence and is not overly concerned about this.  She endorses that patient is not picking or pulling at her hair.  Mom reports that unfortunately patient has had 2 nocturnal enuresis accidents however she endorses that patient is spending about 12 hours in bed.  Mom reports that her husband will probably try to act as a behavioral alarm to help patient get out of bed at least once a night to urinate to decrease this.  Mom reports that she prefers patient having nocturnal enuresis and possible tardive dyskinesia symptoms, which led to Cogentin being discontinued.  Provider and mother discussed that she could talk to PCP about starting  patient on antimuscarinic as this may be beneficial to help with patient's nocturnal enuresis as she was not having the bedwetting when she was on Cogentin.  Mom reports that overall she feels patient has been a very good mood since her Verdia Kuba was placed.  She endorses that patient is back on 10 mg of Prozac.  Mom reports that patient started spotting yesterday, but she has not really noticed any significant irritability or aggression and continues to look for any patterns that may go with patient's cycle in regards to change in behavior. Visit Diagnosis:    ICD-10-CM   1. Autism  F84.0 Acetylcysteine 600 MG CAPS    ARIPiprazole (ABILIFY) 5 MG tablet    cloNIDine (CATAPRES) 0.1 MG tablet    FLUoxetine (PROZAC) 20 MG/5ML solution    traZODone (DESYREL) 50 MG tablet      Past Psychiatric History: 10/02/2021-patient was not having excoriating behaviors, medications were continued. 09/2021-patient started on Prozac 10 mg, liquid form and Abilify 10 mg daily changed to 5 mg twice daily 10/30/2021-decrease Prozac back to 10 mg (2.5 mL), continued Abilify 5 mg twice daily, continue trazodone 100 mg nightly, started patient on Cogentin 1 mg daily 01/15/2022: Increase clonidine to 0.1 mg 3 times daily due to worsening skin picking behaviors, continue all other medications.  Patient was also no longer bedwetting with the addition of Cogentin 1 mg daily.  Patient no longer appeared hypomanic on this assessment, was no longer eating animal food or garbage. 02/15/2022:  Mom concern for possible TD like movements with the mouth, decrease patient's Cogentin to 0.5 mg daily also suggested trying a scarf as patient appears to like picking at her neck and hanging onto her shirts and Collene Leyden of her dresses.  Continue all other medications. 03/15/2022: Patient was extremely irritable and parents were endorsing aggression.  Patient was tearful and irritable during assessment and cannot be calmed down on previous assessments.   Patient was due for her IUD to be changed.  Mom was endorsing changes in behavior and patient endorsing pain that were reminiscent of symptoms of PMDD, discussed with mom increasing patient's Prozac to 20 mg in the week before she starts her period as this may help with some of the behavior changes.  Patient scheduled the IUD change in 04/2022-we will also get labs at this time.   First visit: Patient used to be seen by Dr. Darleene Cleaver, was later transferred to Dr. Carylon Perches.  Patient was last seen by Dr. Carylon Perches (pediatric neurologist) 4/10 who endorsed that patient should return to psychiatric care due to complexity of presentation.   Previous medications include: Celexa (1 week trial, mom reports patient appeared to have trouble sleeping and a flatter affect), Lexapro, Luvox (failed), Zoloft, Focalin (became more agitated and dangerous to self) ropinirole, BuSpar, gabapentin, Trileptal, Lamictal, clonidine, Vistaril, NAC, Valium (prior to procedures), Klonopin, Versed (prior to procedures), Ativan, triazolam (prior to procedures)  Mom endorse some concerns that when patient has tried SSRIs in the past she appears to become more "manic."  When asked for further details Mom reports that patient will become more restless, have outbursts of giggling and her sleep worsens.  Per both parents patient has always had significant trouble sleeping until she was started on trazodone.  Medications at initial visit: Trazodone 100 mg (mom feels is over sedating but prefers 75 mg), Abilify 10 mg (treating agitation, irritability and impulsivity fairly well) clonidine 2 mg daily, NAC 2400 mg (per neurology for skin excoriation)  Past Medical History:  Past Medical History:  Diagnosis Date   Anemia    Anxiety    Autism disorder    per mother severe autism, very social,  good receptive languange but nonverbal (can do some sign languange) , high tolerence to pain   Dysmenorrhea    Menorrhagia     Nonverbal    PT AUTISTIC   PONV (postoperative nausea and vomiting)    per mother "when wakes up pt can go from 0 to 45 and starts taking off cords and try to sit up to stand, better when mother and father are they when she wakes up"   Repetitive movement    due to autism---  chronic picking of own skin and body    Past Surgical History:  Procedure Laterality Date   DENTAL RESTORATION/EXTRACTION WITH X-RAY     DILATION AND CURETTAGE OF UTERUS N/A 04/07/2017   Procedure: DILATATION AND CURETTAGE;  Surgeon: Megan Salon, MD;  Location: [facility];  Service: Gynecology;  Laterality: N/A;   INTRAUTERINE DEVICE (IUD) INSERTION N/A 04/07/2017   Procedure: PLACEMENT OF Verdia Kuba  IUD;  Surgeon: Megan Salon, MD;  Location: [facility];  Service: Gynecology;  Laterality: N/A;   INTRAUTERINE DEVICE (IUD) INSERTION N/A 04/23/2022   Procedure: INTRAUTERINE DEVICE (IUD) INSERTION;  Surgeon: Megan Salon, MD;  Location: [place];  Service: Gynecology;  Laterality: N/A;   IUD REMOVAL N/A 04/23/2022   Procedure: INTRAUTERINE DEVICE (IUD) REMOVAL;  Surgeon: Megan Salon, MD;  Location: [facility];  Service: Gynecology;  Laterality: N/A;   MASS EXCISION Left 06/15/2015   Procedure: EXCISION OF LEFT AXILLARY MASS/TETANUS INJECTION;  Surgeon: Wallace Going, DO;  Location: [facility];  Service: Plastics;  Laterality: Left;   OPERATIVE ULTRASOUND N/A 04/23/2022   Procedure: OPERATIVE ULTRASOUND;  Surgeon: Megan Salon, MD;  Location: [facility];  Service: Gynecology;  Laterality: N/A;    Family Psychiatric History: Parents report some history of family members with depression     Family History:  Family History  Problem Relation Age of Onset   Heart disease Maternal Grandmother    Endometriosis Maternal Grandmother    Diabetes Maternal Grandfather    Cancer Maternal Grandfather        Skin Cancer    Asthma Mother    Hyperlipidemia Mother    Hypertension Paternal Grandfather    Hypertension Father    Hyperlipidemia Father    Diverticulosis Father    Arthritis Paternal Grandmother     Social History:  Social History   Socioeconomic History   Marital status: Single    Spouse name: Not on file   Number of children: Not on file   Years of education: Not on file   Highest education level: Not on file  Occupational History   Occupation: Armed forces training and education officer in [place]  Tobacco Use   Smoking status: Never   Smokeless tobacco: Never  Vaping Use   Vaping Use: Never used  Substance and Sexual Activity   Alcohol use: Never   Drug use: Never   Sexual activity: Never    Birth control/protection: None  Other Topics Concern   Not on file  Social History Narrative   Patient lives with both parents. Lyndee Leo graduated from [employer]. She is now at [employer] specialist consult with mom once a month.    Therapies: Not at the moment.    Social Determinants of Health   Financial Resource Strain: Not on file  Food Insecurity: Not on file  Transportation Needs: Not on file  Physical Activity: Not on file  Stress: Not on file  Social Connections: Not on file    Allergies:  Allergies  Allergen Reactions   Cleaner [Ao-Sept Disinfection-Neutral] Hives    BRAND NAME - KABOOM    Metabolic Disorder Labs: Lab Results  Component Value Date   HGBA1C 5.3 04/23/2022   MPG 105.41 04/23/2022   MPG 108.28 08/11/2019   Lab Results  Component Value Date   PROLACTIN 7.9 04/23/2022   Lab Results  Component Value Date   CHOL 181 04/23/2022   TRIG 68 04/23/2022   HDL 31 (L) 04/23/2022   CHOLHDL 5.8 04/23/2022   VLDL 14 04/23/2022   LDLCALC 136 (H) 04/23/2022   LDLCALC 102 (H) 08/11/2019   Lab Results  Component Value Date   TSH 2.564 04/23/2022   TSH 2.188 08/11/2019    Therapeutic Level Labs: No results found for: "LITHIUM" No results found for:  "VALPROATE" No results found for: "CBMZ"  Current Medications: Current Outpatient Medications  Medication Sig Dispense Refill   Acetylcysteine 600 MG CAPS Take 2 capsules (1,200 mg total) by mouth 2 (two) times daily. 120 capsule 3   ARIPiprazole (ABILIFY) 5 MG tablet Take 1 tablet (5 mg total) by mouth 2 (two) times daily. 60 tablet 2   cloNIDine (CATAPRES) 0.1 MG tablet Take 1 tablet (0.1 mg  total) by mouth 3 (three) times daily. AM /   1300/  1800 90 tablet 2   FLUoxetine (PROZAC) 20 MG/5ML solution Take 2.5 mLs (10 mg total) by mouth daily. Can take 37m in the week before period, but decrease back to 2.545mdose when period starts 120 mL 1   ibuprofen (ADVIL) 200 MG tablet Take 400 mg by mouth every 6 (six) hours as needed for fever, headache or mild pain.     KYLEENA 19.5 MG IUD 1 Device by Intrauterine route once for 1 dose. 1 Intra Uterine Device 0   Multiple Vitamin (MULTIVITAMIN) tablet Take 1 tablet by mouth daily.     mupirocin ointment (BACTROBAN) 2 % Apply 1 application topically 2 (two) times daily. (Patient taking differently: Apply 1 application  topically 2 (two) times daily as needed.) 22 g 0   traZODone (DESYREL) 50 MG tablet Take 2 tablets (100 mg total) by mouth at bedtime. 60 tablet 2   No current facility-administered medications for this visit.     Musculoskeletal: Strength & Muscle Tone: within normal limits Gait & Station: normal Patient leans: N/A  Psychiatric Specialty Exam: Review of Systems  Psychiatric/Behavioral:  Negative for sleep disturbance.     Last menstrual period 04/19/2022.There is no height or weight on file to calculate BMI.  General Appearance: Casual  Eye Contact:  Poor  Speech:  NA  Volume:   Baseline for patient not as loud as at last visit  Mood:  Euthymic  Affect:   WNL for patient  Thought Process:  NA  Orientation:  NA  Thought Content: NA   Suicidal Thoughts:   NA  Homicidal Thoughts:   NA  Memory:  NA  Judgement:  NA   Insight:  NA  Psychomotor Activity:   Increased at baseline  Concentration: Baseline  Recall:  NA  Fund of Knowledge: Poor  Language: NA  Akathisia:  No  Handed:    AIMS (if indicated): not done  Assets:  Housing Social Support  ADL's:  Impaired  Cognition: Impaired,  Severe  Sleep:  Good   Screenings: [facility]-SS[facility] RISK CATEGORY No Risk        Assessment and Plan:  ClLashayna Carlyles a [age]ear old patient with a history of autism, skin picking excoriation disorder, tic, developmental delay and akathisia of unknown etiology.    Based on assessment today patient appears to be doing really well.  Patient's behavior may be affected by hormonal changes she was noted to be at the worst this provider has seen, at the end of her IUD while she is not the best this provider has ever seeing at the beginning of the new IUD.  Patient is again having bedwetting however decision was made to not restart Cogentin as patient is no longer having the lip smacking movements per mom.  No medication adjustments will be made today as patient appears to be doing fairly well and while she has started to pick at her skin again she is not pulling at her hair and is overall in a fairly good mood since her IUD was replaced.  Labs obtained: 04/23/2022-CMP (WNL/W/exception calcium 8.7), iron and TIBC-WNL, ferritin-WNL, hemoglobin A1c-WNL, lipid panel-HDL 31/LDL 136, prolactin-WNL, TSH-WNL, T3-WNL, T4-free: WNL  Autism Skin picking excoriation disorder Tics - Continue Abilify 5 mg twice daily - Continue trazodone 100 mg nightly - Continue Prozac 10 mg (liquid form), for skin picking excoriation  and other repetitive behaviors (week before.  Increase to 20 mg or 5 mL-then decrease back to 10 mg / 2.5 mL once period starts) - Continue clonidine 0.'1mg'$  3 times daily, increased from twice daily - Continue N-acetylcysteine 1200 mg twice  daily - F/U in 4 weeks -Mom looking for new care facilities and OT, using referrals from pediatric neurologist   Hx akathisia, etiology unknown -Abnormal lip movements (concern for TD) when on Cogentin, resolved now that Cogentin has been discontinued  Collaboration of Care: Collaboration of Care:   Patient/Guardian was advised Release of Information must be obtained prior to any record release in order to collaborate their care with an outside provider. Patient/Guardian was advised if they have not already done so to contact the registration department to sign all necessary forms in order for [place] to release information regarding their care.   Consent: Patient/Guardian gives verbal consent for treatment and assignment of benefits for services provided during this visit. Patient/Guardian expressed understanding and agreed to proceed.   PGY-3 Freida Busman, MD 05/14/2022, 10:06 AM

## 2022-06-28 ENCOUNTER — Ambulatory Visit (INDEPENDENT_AMBULATORY_CARE_PROVIDER_SITE_OTHER): Payer: Medicaid Other | Admitting: Student in an Organized Health Care Education/Training Program

## 2022-06-28 DIAGNOSIS — F840 Autistic disorder: Secondary | ICD-10-CM

## 2022-06-28 MED ORDER — ARIPIPRAZOLE 5 MG PO TABS: 5.0000 mg | ORAL_TABLET | Freq: Two times a day (BID) | ORAL | 2 refills | Status: DC

## 2022-06-28 MED ORDER — TRAZODONE HCL 50 MG PO TABS: 100.0000 mg | ORAL_TABLET | Freq: Every day | ORAL | 2 refills | Status: DC

## 2022-06-28 MED ORDER — ACETYLCYSTEINE 600 MG PO CAPS: 1200.0000 mg | ORAL_CAPSULE | Freq: Two times a day (BID) | ORAL | 3 refills | Status: DC

## 2022-06-28 MED ORDER — FLUOXETINE HCL 20 MG/5ML PO SOLN: 10.0000 mg | Freq: Every day | ORAL | 1 refills | Status: DC

## 2022-06-28 MED ORDER — CLONIDINE HCL 0.1 MG PO TABS: 0.1000 mg | ORAL_TABLET | Freq: Three times a day (TID) | ORAL | 2 refills | Status: DC

## 2022-06-28 NOTE — Progress Notes (Signed)
BH MD/PA/NP OP Progress Note  06/28/2022 4:27 PM Gabrielle [name]  MRN:  [id number]  Chief Complaint:  Chief Complaint  Patient presents with   Follow-up   HPI: Gabrielle [name] is a 22 year old patient with a history of autism, skin picking excoriation disorder, tic, developmental delay and akathisia of unknown etiology.  Patient presents today with her mother.  Per mother, patient is nonverbal at baseline.   Patient is compliant with the following medication regimen: Abilify 5 mg twice daily NAC 1200 mg twice daily Trazodone 100 mg nightly Prozac 10 mg (liquid solution) (  week before period if noted to be more aggressive and irritable) Clonidine 0.1 mg 3 times daily  On assessment today objectively patient appears to be in a good mood.  Patient is jumping up and down, clapping her hands, smiling and laughing while wandering around the room.  Mom reports that since last being seen patient did have somewhat of a difficult end of March and April.  Mom reports that this is not uncommon, she appears to cyclically have changes in behavior on this time.  Unfortunately she did have 1 bouts of aggression however mom reports that she thinks that this has been taking care of with behavior modification and may have been due to reintroduction to a staff member.  Mom reports that patient has had 3-4 episodes of bedwetting.  What is most news that patient has had 2 episodes of urinating herself while walking around and 1 episode of defecation on herself.  Mom reports that patient has had some changes in her morning bowel routine as well as urinary routine.  She will continue to monitor this mom reports that patient also has been very taken with raisin bran which could be making it more difficult for patient to regulate her bowels.  Mom's pertinent concern at this time is patient has been getting out of the [employer] that takes her to her day activities and running into the street.  Mom reports that she is interested  in getting paperwork done for patient to get a restraining harness for her to sit in the [place].  Mom reports that while she has concerns about these type of restraints, she feels that is safer and more in patient's benefit for her to be able to remain in the car when the doors open and other individuals are being picked up.  Mom reports that this often happens in the a.m. when one of the larger staff is not able to ride in the [place].   Visit Diagnosis:    ICD-10-CM   1. Autism  F84.0       Past Psychiatric History:  10/02/2021-patient was not having excoriating behaviors, medications were continued. 09/2021-patient started on Prozac 10 mg, liquid form and Abilify 10 mg daily changed to 5 mg twice daily 10/30/2021-decrease Prozac back to 10 mg (2.5 mL), continued Abilify 5 mg twice daily, continue trazodone 100 mg nightly, started patient on Cogentin 1 mg daily 01/15/2022: Increase clonidine to 0.1 mg 3 times daily due to worsening skin picking behaviors, continue all other medications.  Patient was also no longer bedwetting with the addition of Cogentin 1 mg daily.  Patient no longer appeared hypomanic on this assessment, was no longer eating animal food or garbage. 02/15/2022: Mom concern for possible TD like movements with the mouth, decrease patient's Cogentin to 0.5 mg daily also suggested trying a scarf as patient appears to like picking at her neck and hanging onto her shirts and Juluis Rainier of  her dresses.  Continue all other medications. 03/15/2022: Patient was extremely irritable and parents were endorsing aggression.  Patient was tearful and irritable during assessment and cannot be calmed down on previous assessments.  Patient was due for her IUD to be changed.  Mom was endorsing changes in behavior and patient endorsing pain that were reminiscent of symptoms of PMDD, discussed with mom increasing patient's Prozac to 20 mg in the week before she starts her period as this may help with some of the  behavior changes.  Patient scheduled the IUD change in 04/2022-we will also get labs at this time. 04/2022-patient doing fairly well and no medication adjustments made.  Patient bedwetting did return with the discontinuation of Cogentin however she is no longer having the lip smacking movements therefore it was not restarted.  Patient's behavior appeared to return more similar to baseline after her IUD was placed.   First visit: Patient used to be seen by Dr. Jannifer Franklin, was later transferred to Dr. Lorenz Coaster.  Patient was last seen by Dr. Lorenz Coaster (pediatric neurologist) 4/10 who endorsed that patient should return to psychiatric care due to complexity of presentation.   Previous medications include: Celexa (1 week trial, mom reports patient appeared to have trouble sleeping and a flatter affect), Lexapro, Luvox (failed), Zoloft, Focalin (became more agitated and dangerous to self) ropinirole, BuSpar, gabapentin, Trileptal, Lamictal, clonidine, Vistaril, NAC, Valium (prior to procedures), Klonopin, Versed (prior to procedures), Ativan, triazolam (prior to procedures)  Mom endorse some concerns that when patient has tried SSRIs in the past she appears to become more "manic."  When asked for further details Mom reports that patient will become more restless, have outbursts of giggling and her sleep worsens.  Per both parents patient has always had significant trouble sleeping until she was started on trazodone.  Medications at initial visit: Trazodone 100 mg (mom feels is over sedating but prefers 75 mg), Abilify 10 mg (treating agitation, irritability and impulsivity fairly well) clonidine 2 mg daily, NAC 2400 mg (per neurology for skin excoriation)  Past Medical History:  Past Medical History:  Diagnosis Date   Anemia    Anxiety    Autism disorder    per mother severe autism, very social,  good receptive languange but nonverbal (can do some sign languange) , high tolerence to pain    Dysmenorrhea    Menorrhagia    Nonverbal    PT AUTISTIC   PONV (postoperative nausea and vomiting)    per mother "when wakes up pt can go from 0 to 60 and starts taking off cords and try to sit up to stand, better when mother and father are they when she wakes up"   Repetitive movement    due to autism---  chronic picking of own skin and body    Past Surgical History:  Procedure Laterality Date   DENTAL RESTORATION/EXTRACTION WITH X-RAY     DILATION AND CURETTAGE OF UTERUS N/A 04/07/2017   Procedure: DILATATION AND CURETTAGE;  Surgeon: Jerene Bears, MD;  Location: [facility];  Service: Gynecology;  Laterality: N/A;   INTRAUTERINE DEVICE (IUD) INSERTION N/A 04/07/2017   Procedure: PLACEMENT OF Rutha Bouchard  IUD;  Surgeon: Jerene Bears, MD;  [facility]  Service: Gynecology;  Laterality: N/A;   INTRAUTERINE DEVICE (IUD) INSERTION N/A 04/23/2022   Procedure: INTRAUTERINE DEVICE (IUD) INSERTION;  Surgeon: Jerene Bears, MD;  Location: [facility];  Service: Gynecology;  Laterality: N/A;   IUD  REMOVAL N/A 04/23/2022   Procedure: INTRAUTERINE DEVICE (IUD) REMOVAL;  Surgeon: Jerene Bears, MD;  Location: [facility];  Service: Gynecology;  Laterality: N/A;   MASS EXCISION Left 06/15/2015   Procedure: EXCISION OF LEFT AXILLARY MASS/TETANUS INJECTION;  Surgeon: Peggye Form, DO;  Location: [facility];  Service: Plastics;  Laterality: Left;   OPERATIVE ULTRASOUND N/A 04/23/2022   Procedure: OPERATIVE ULTRASOUND;  Surgeon: Jerene Bears, MD;  Location: [facility];  Service: Gynecology;  Laterality: N/A;    Family Psychiatric History: Parents report some history of family members with depression     Family History:  Family History  Problem Relation Age of Onset   Heart disease Maternal Grandmother    Endometriosis Maternal Grandmother    Diabetes Maternal Grandfather    Cancer Maternal  Grandfather        Skin Cancer   Asthma Mother    Hyperlipidemia Mother    Hypertension Paternal Grandfather    Hypertension Father    Hyperlipidemia Father    Diverticulosis Father    Arthritis Paternal Grandmother     Social History:  Social History   Socioeconomic History   Marital status: Single    Spouse name: Not on file   Number of children: Not on file   Years of education: Not on file   Highest education level: Not on file  Occupational History   Occupation: Medical illustrator in [place]  Tobacco Use   Smoking status: Never   Smokeless tobacco: Never  Vaping Use   Vaping Use: Never used  Substance and Sexual Activity   Alcohol use: Never   Drug use: Never   Sexual activity: Never    Birth control/protection: None  Other Topics Concern   Not on file  Social History Narrative   Patient lives with both parents. [name] graduated from [employer]. She is now at [employer]       Behavior specialist consult with mom once a month.    Therapies: Not at the moment.    Social Determinants of Health   Financial Resource Strain: Not on file  Food Insecurity: Not on file  Transportation Needs: Not on file  Physical Activity: Not on file  Stress: Not on file  Social Connections: Not on file    Allergies:  Allergies  Allergen Reactions   Cleaner [Ao-Sept Disinfection-Neutral] Hives    BRAND NAME - KABOOM    Metabolic Disorder Labs: Lab Results  Component Value Date   HGBA1C 5.3 04/23/2022   MPG 105.41 04/23/2022   MPG 108.28 08/11/2019   Lab Results  Component Value Date   PROLACTIN 7.9 04/23/2022   Lab Results  Component Value Date   CHOL 181 04/23/2022   TRIG 68 04/23/2022   HDL 31 (L) 04/23/2022   CHOLHDL 5.8 04/23/2022   VLDL 14 04/23/2022   LDLCALC 136 (H) 04/23/2022   LDLCALC 102 (H) 08/11/2019   Lab Results  Component Value Date   TSH 2.564 04/23/2022   TSH 2.188 08/11/2019    Therapeutic Level Labs: No results found for:  "LITHIUM" No results found for: "VALPROATE" No results found for: "CBMZ"  Current Medications: Current Outpatient Medications  Medication Sig Dispense Refill   Acetylcysteine 600 MG CAPS Take 2 capsules (1,200 mg total) by mouth 2 (two) times daily. 120 capsule 3   ARIPiprazole (ABILIFY) 5 MG tablet Take 1 tablet (5 mg total) by mouth 2 (two) times daily. 60 tablet 2  cloNIDine (CATAPRES) 0.1 MG tablet Take 1 tablet (0.1 mg total) by mouth 3 (three) times daily. AM /   1300/  1800 90 tablet 2   FLUoxetine (PROZAC) 20 MG/5ML solution Take 2.5 mLs (10 mg total) by mouth daily. Can take 5mL in the week before period, but decrease back to 2.50mL dose when period starts 120 mL 1   ibuprofen (ADVIL) 200 MG tablet Take 400 mg by mouth every 6 (six) hours as needed for fever, headache or mild pain.     KYLEENA 19.5 MG IUD 1 Device by Intrauterine route once for 1 dose. 1 Intra Uterine Device 0   Multiple Vitamin (MULTIVITAMIN) tablet Take 1 tablet by mouth daily.     mupirocin ointment (BACTROBAN) 2 % Apply 1 application topically 2 (two) times daily. (Patient taking differently: Apply 1 application  topically 2 (two) times daily as needed.) 22 g 0   traZODone (DESYREL) 50 MG tablet Take 2 tablets (100 mg total) by mouth at bedtime. 60 tablet 2   No current facility-administered medications for this visit.     Musculoskeletal: Strength & Muscle Tone: within normal limits Gait & Station: normal, prefers to jump around, but overall appears to walk baseline for her Patient leans: N/A  Psychiatric Specialty Exam: Psychiatric Specialty Exam: Review of Systems  Psychiatric/Behavioral:  Negative for sleep disturbance.      Last menstrual period 04/19/2022.There is no height or weight on file to calculate BMI.  General Appearance: Casual  Eye Contact:  Poor  Speech:  NA  Volume:   Baseline for patient not as loud as at last visit  Mood:  Euthymic  Affect:   WNL for patient  Thought Process:  NA   Orientation:  NA  Thought Content: NA   Suicidal Thoughts:   NA  Homicidal Thoughts:   NA  Memory:  NA  Judgement:  NA  Insight:  NA  Psychomotor Activity:   Increased at baseline  Concentration: Baseline  Recall:  NA  Fund of Knowledge: Poor  Language: NA  Akathisia:  No  Handed:    AIMS (if indicated): not done  Assets:  Housing Social Support  ADL's:  Impaired  Cognition: Impaired,  Severe  Sleep:  Good   Screenings: Flowsheet Row ED from 10/03/2020 in [facility])  C-SSRS RISK CATEGORY No Risk        Assessment and Plan: No medication adjustments made at this time.  Patient has been having some different behaviors however, mom overall feels that this is a normal pattern for patient around this time of year and that a lot of the behaviors can be adjusted be in behavior modifications.  Provider will work on trying to get patient a letter in support of a harness as patient's behavior fine other than is dangerous and puts her at risk for being hit by other vehicles.   Autism Skin picking excoriation disorder Tics - Continue Abilify 5 mg twice daily - Continue trazodone 100 mg nightly - Continue Prozac 10 mg (liquid form), for skin picking excoriation and other repetitive behaviors (week before.  Increase to 20 mg or 5 mL-then decrease back to 10 mg / 2.5 mL once period starts) - Continue clonidine 0.1mg  3 times daily, increased from twice daily - Continue N-acetylcysteine 1200 mg twice daily - F/U in 4 weeks  Hx akathisia, etiology unknown -Abnormal lip movements (concern for TD) when on Cogentin, resolved now that Cogentin has been discontinued (04/2022)  Collaboration of Care: Collaboration of Care:   Patient/Guardian was advised Release of Information must be obtained prior to any record release in order to collaborate their care with an outside provider. Patient/Guardian was advised if they have not already done so to  contact the registration department to sign all necessary forms in order for [place] to release information regarding their care.   Consent: Patient/Guardian gives verbal consent for treatment and assignment of benefits for services provided during this visit. Patient/Guardian expressed understanding and agreed to proceed.    Bobbye Morton, MD 06/28/2022, 4:27 PM

## 2022-07-02 ENCOUNTER — Telehealth (HOSPITAL_COMMUNITY): Payer: Self-pay | Admitting: Student in an Organized Health Care Education/Training Program

## 2022-07-02 NOTE — Telephone Encounter (Signed)
Called mom to discuss the paperwork left regarding request to case manager about a protective harness for patient. Endorsed provider would be willing to write a medical necessity letter, but requested mom reach out to CM to make sure that it would be accepted and if there was a specific template needed. Mom appreciated phone call and endorsed she would get back with the info when she can.

## 2022-07-02 NOTE — Addendum Note (Signed)
Addended by: Eliseo Gum B on: 07/02/2022 04:20 PM   Modules accepted: Level of Service

## 2022-08-08 NOTE — Progress Notes (Signed)
BH MD/PA/NP OP Progress Note  08/09/2022 9:30 AM Gabrielle [name]  MRN:  [id number]  Chief Complaint:  Chief Complaint  Patient presents with   Follow-up   HPI: Gabrielle [name] is a 22 year old patient with a history of autism, skin picking excoriation disorder, tic, developmental delay and akathisia of unknown etiology.  Patient presents today with her mother.  Per mother, patient is nonverbal at baseline.  Dr. Alfonse Flavors rising PGY3 was present for today's assessment for warm handoff.   Patient is compliant with the following medication regimen: Abilify 5 mg twice daily NAC 1200 mg twice daily Trazodone 100 mg nightly Prozac 10 mg (liquid solution) (20mg  week before period if noted to be more aggressive and irritable) Clonidine 0.1 mg 3 times daily  Initially on assessment today patient preferred to keep her arms crossed with a restricted affect.  Mom endorsed that patient seemed irritated as soon as she walked to the doors but her mood had been fine before that.  Mom reports that while waiting in the waiting room patient attempted to leave multiple times, but was unable to.  Eventually during assessment patient returned to her normal baseline of appointments with a smile on her face, clapping her hands with repetitive movements and looking out the windows.  Mom reports that she feels that the last month has been an improvement from April.  Mom reports that she has not had any issues with aggression however patient has been a bit more physical with her.  Mom reports that patient grips her and has left at least 2 bruises on her arms however, mom thinks that this may be because of how she is being handled at her day program, where her caretaker is deaf and has to be a bit more physical with her.  Patient has started skin picking again and has 1 bleeding sore on her left elbow, but no open wounds on her chest.  Patient has also started gagging herself and inducing vomit.  Mom reports that this is a  behavior that been going on for 10 years and cyclical like a lot of patient's repetitive behaviors.  Mom reports that she thinks patient is trying to clear phlegm as she only witnesses or finds vomit around the home in the early a.m., and endorses that it looks more like phlegm than true emesis related to being sick.  Patient was witnessed attempting to do this today while she was looking out the window.  Patient did not reattempt this at any other point during the assessment.  Patient is also wet the bed 1-2 times since being seen.  Visit Diagnosis:    ICD-10-CM   1. Autism  F84.0 ARIPiprazole (ABILIFY) 5 MG tablet    cloNIDine (CATAPRES) 0.1 MG tablet    FLUoxetine (PROZAC) 20 MG/5ML solution    traZODone (DESYREL) 50 MG tablet      Past Psychiatric History:  10/02/2021-patient was not having excoriating behaviors, medications were continued. 09/2021-patient started on Prozac 10 mg, liquid form and Abilify 10 mg daily changed to 5 mg twice daily 10/30/2021-decrease Prozac back to 10 mg (2.5 mL), continued Abilify 5 mg twice daily, continue trazodone 100 mg nightly, started patient on Cogentin 1 mg daily 01/15/2022: Increase clonidine to 0.1 mg 3 times daily due to worsening skin picking behaviors, continue all other medications.  Patient was also no longer bedwetting with the addition of Cogentin 1 mg daily.  Patient no longer appeared hypomanic on this assessment, was no longer eating animal food  or garbage. 02/15/2022: Mom concern for possible TD like movements with the mouth, decrease patient's Cogentin to 0.5 mg daily also suggested trying a scarf as patient appears to like picking at her neck and hanging onto her shirts and [name] of her dresses.  Continue all other medications. 03/15/2022: Patient was extremely irritable and parents were endorsing aggression.  Patient was tearful and irritable during assessment and cannot be calmed down on previous assessments.  Patient was due for her IUD to be  changed.  Mom was endorsing changes in behavior and patient endorsing pain that were reminiscent of symptoms of PMDD, discussed with mom increasing patient's Prozac to 20 mg in the week before she starts her period as this may help with some of the behavior changes.  Patient scheduled the IUD change in 04/2022-we will also get labs at this time. 04/2022-patient doing fairly well and no medication adjustments made.  Patient bedwetting did return with the discontinuation of Cogentin however she is no longer having the lip smacking movements therefore it was not restarted.  Patient's behavior appeared to return more similar to baseline after her IUD was placed. 06/2022-Patient has been having some different behaviors (stool on self and 1 episode of aggression) however, mom overall feels that this is a normal pattern for patient around this time of year and that a lot of the behaviors can be adjusted be in behavior modifications.   011 First visit: Patient used to be seen by Dr. Jannifer Franklin, was later transferred to Dr. Lorenz Coaster.  Patient was last seen by Dr. Lorenz Coaster (pediatric neurologist) 4/10 who endorsed that patient should return to psychiatric care due to complexity of presentation.   Previous medications include: Celexa (1 week trial, mom reports patient appeared to have trouble sleeping and a flatter affect), Lexapro, Luvox (failed), Zoloft, Focalin (became more agitated and dangerous to self) ropinirole, BuSpar, gabapentin, Trileptal, Lamictal, clonidine, Vistaril, NAC, Valium (prior to procedures), Klonopin, Versed (prior to procedures), Ativan, triazolam (prior to procedures)  Mom endorse some concerns that when patient has tried SSRIs in the past she appears to become more "manic."  When asked for further details Mom reports that patient will become more restless, have outbursts of giggling and her sleep worsens.  Per both parents patient has always had significant trouble sleeping until  she was started on trazodone.  Medications at initial visit: Trazodone 100 mg (mom feels is over sedating but prefers 75 mg), Abilify 10 mg (treating agitation, irritability and impulsivity fairly well) clonidine 2 mg daily, NAC 2400 mg (per neurology for skin excoriation)  Past Medical History:  Past Medical History:  Diagnosis Date   Anemia    Anxiety    Autism disorder    per mother severe autism, very social,  good receptive languange but nonverbal (can do some sign languange) , high tolerence to pain   Dysmenorrhea    Menorrhagia    Nonverbal    PT AUTISTIC   PONV (postoperative nausea and vomiting)    per mother "when wakes up pt can go from 0 to 60 and starts taking off cords and try to sit up to stand, better when mother and father are they when she wakes up"   Repetitive movement    due to autism---  chronic picking of own skin and body    Past Surgical History:  Procedure Laterality Date   DENTAL RESTORATION/EXTRACTION WITH X-RAY     DILATION AND CURETTAGE OF UTERUS N/A 04/07/2017   Procedure: DILATATION AND  CURETTAGE;  Surgeon: Jerene Bears, MD;  Location: [facility];  Service: Gynecology;  Laterality: N/A;   INTRAUTERINE DEVICE (IUD) INSERTION N/A 04/07/2017   Procedure: PLACEMENT OF Rutha Bouchard  IUD;  Surgeon: Jerene Bears, MD;  Location: [facility];  Service: Gynecology;  Laterality: N/A;   INTRAUTERINE DEVICE (IUD) INSERTION N/A 04/23/2022   Procedure: INTRAUTERINE DEVICE (IUD) INSERTION;  Surgeon: Jerene Bears, MD;  Location: [facility];  Service: Gynecology;  Laterality: N/A;   IUD REMOVAL N/A 04/23/2022   Procedure: INTRAUTERINE DEVICE (IUD) REMOVAL;  Surgeon: Jerene Bears, MD;  Location: [facility];  Service: Gynecology;  Laterality: N/A;   MASS EXCISION Left 06/15/2015   Procedure: EXCISION OF LEFT AXILLARY MASS/TETANUS INJECTION;  Surgeon: Peggye Form, DO;  Location: [facility];   Service: Plastics;  Laterality: Left;   OPERATIVE ULTRASOUND N/A 04/23/2022   Procedure: OPERATIVE ULTRASOUND;  Surgeon: Jerene Bears, MD;  Location: [facility];  Service: Gynecology;  Laterality: N/A;    Family Psychiatric History: Parents report some history of family members with depression       Family History:  Family History  Problem Relation Age of Onset   Heart disease Maternal Grandmother    Endometriosis Maternal Grandmother    Diabetes Maternal Grandfather    Cancer Maternal Grandfather        Skin Cancer   Asthma Mother    Hyperlipidemia Mother    Hypertension Paternal Grandfather    Hypertension Father    Hyperlipidemia Father    Diverticulosis Father    Arthritis Paternal Grandmother     Social History:  Social History   Socioeconomic History   Marital status: Single    Spouse name: Not on file   Number of children: Not on file   Years of education: Not on file   Highest education level: Not on file  Occupational History   Occupation: Medical illustrator in [place]  Tobacco Use   Smoking status: Never   Smokeless tobacco: Never  Vaping Use   Vaping Use: Never used  Substance and Sexual Activity   Alcohol use: Never   Drug use: Never   Sexual activity: Never    Birth control/protection: None  Other Topics Concern   Not on file  Social History Narrative   Patient lives with both parents. Gabrielle [name] graduated from [employer]. She is now at [employer]       Behavior specialist consult with mom once a month.    Therapies: Not at the moment.    Social Determinants of Health   Financial Resource Strain: Not on file  Food Insecurity: Not on file  Transportation Needs: Not on file  Physical Activity: Not on file  Stress: Not on file  Social Connections: Not on file    Allergies:  Allergies  Allergen Reactions   Cleaner [Ao-Sept Disinfection-Neutral] Hives    BRAND NAME - KABOOM    Metabolic Disorder Labs: Lab Results   Component Value Date   HGBA1C 5.3 04/23/2022   MPG 105.41 04/23/2022   MPG 108.28 08/11/2019   Lab Results  Component Value Date   PROLACTIN 7.9 04/23/2022   Lab Results  Component Value Date   CHOL 181 04/23/2022   TRIG 68 04/23/2022   HDL 31 (L) 04/23/2022   CHOLHDL 5.8 04/23/2022   VLDL 14 04/23/2022   LDLCALC 136 (H) 04/23/2022   LDLCALC 102 (H) 08/11/2019   Lab  Results  Component Value Date   TSH 2.564 04/23/2022   TSH 2.188 08/11/2019    Therapeutic Level Labs: No results found for: "LITHIUM" No results found for: "VALPROATE" No results found for: "CBMZ"  Current Medications: Current Outpatient Medications  Medication Sig Dispense Refill   Acetylcysteine 600 MG CAPS Take 2 capsules (1,200 mg total) by mouth 2 (two) times daily. 120 capsule 3   ARIPiprazole (ABILIFY) 5 MG tablet Take 1 tablet (5 mg total) by mouth 2 (two) times daily. 60 tablet 2   cloNIDine (CATAPRES) 0.1 MG tablet Take 1 tablet (0.1 mg total) by mouth 3 (three) times daily. AM /   1300/  1800 90 tablet 2   FLUoxetine (PROZAC) 20 MG/5ML solution Take 2.5 mLs (10 mg total) by mouth daily. Can take 5mL in the week before period, but decrease back to 2.43mL dose when period starts 120 mL 1   ibuprofen (ADVIL) 200 MG tablet Take 400 mg by mouth every 6 (six) hours as needed for fever, headache or mild pain.     KYLEENA 19.5 MG IUD 1 Device by Intrauterine route once for 1 dose. 1 Intra Uterine Device 0   Multiple Vitamin (MULTIVITAMIN) tablet Take 1 tablet by mouth daily.     mupirocin ointment (BACTROBAN) 2 % Apply 1 application topically 2 (two) times daily. (Patient taking differently: Apply 1 application  topically 2 (two) times daily as needed.) 22 g 0   traZODone (DESYREL) 50 MG tablet Take 2 tablets (100 mg total) by mouth at bedtime. 60 tablet 2   No current facility-administered medications for this visit.     Musculoskeletal: Strength & Muscle Tone: within normal limits Gait & Station:  normal Patient leans: N/A  Psychiatric Specialty Exam: Review of Systems  Psychiatric/Behavioral:  Negative for sleep disturbance.      Last menstrual period 04/19/2022.There is no height or weight on file to calculate BMI.  General Appearance: Casual  Eye Contact:  Poor  Speech:  NA  Volume:   Baseline for patient   Mood:  Euthymic  Affect:   WNL for patient  Thought Process:  NA  Orientation:  NA  Thought Content: NA   Suicidal Thoughts:   NA  Homicidal Thoughts:   NA  Memory:  NA  Judgement:  NA  Insight:  NA  Psychomotor Activity:   Increased at baseline  Concentration: Baseline  Recall:  NA  Fund of Knowledge: Poor  Language: NA  Akathisia:  No  Handed:    AIMS (if indicated): not done  Assets:  Housing Social Support  ADL's:  Impaired  Cognition: Impaired,  Severe  Sleep:  Good     Screenings: Flowsheet Row ED from 10/03/2020 in [facility])  C-SSRS RISK CATEGORY No Risk        Assessment and Plan: Based on assessment patient continues to display repetitive behaviors in a cyclical pattern.  Although inducing emesis was a bit new for provider, mom endorses that this is similar to her bedwetting as it comes and goes and has for the last 10 years.  Mom also did not appear very concerned as it is restricted to a certain time of day and generally a season, suggesting that the hypothesis for trying to clear phlegm is fairly strong.  There was brief discussion about increasing Abilify to 20 mg however, mom endorse that she thinks this behavior will go away as allergy season starts to dissipate.  Mom did bring  a letter outlining request for the harness for patient.  Mom endorses that patient has been leaning forward and rapping the driver on occasion.  Mom also endorses that she needs assistance for patient to receive a psych eval in order to get permission for patient use to harness.  Autism Skin picking excoriation  disorder Tics - Continue Abilify 5 mg twice daily - Continue trazodone 100 mg nightly - Continue Prozac 10 mg (liquid form), for skin picking excoriation and other repetitive behaviors (week before.  Increase to 20 mg or 5 mL-then decrease back to 10 mg / 2.5 mL once period starts) - Continue clonidine 0.1mg  3 times daily - Continue N-acetylcysteine 1200 mg twice daily - F/U in 4 weeks   Hx akathisia, etiology unknown -Abnormal lip movements (concern for TD) when on Cogentin, resolved now that Cogentin has been discontinued (04/2022)  Collaboration of Care: Collaboration of Care:   Patient/Guardian was advised Release of Information must be obtained prior to any record release in order to collaborate their care with an outside provider. Patient/Guardian was advised if they have not already done so to contact the registration department to sign all necessary forms in order for [place] to release information regarding their care.   Consent: Patient/Guardian gives verbal consent for treatment and assignment of benefits for services provided during this visit. Patient/Guardian expressed understanding and agreed to proceed.   PGY-3 Bobbye Morton, MD 08/09/2022, 9:30 AM

## 2022-08-09 ENCOUNTER — Encounter (HOSPITAL_COMMUNITY): Payer: Self-pay | Admitting: Student in an Organized Health Care Education/Training Program

## 2022-08-09 ENCOUNTER — Ambulatory Visit (INDEPENDENT_AMBULATORY_CARE_PROVIDER_SITE_OTHER): Payer: Medicaid Other | Admitting: Student in an Organized Health Care Education/Training Program

## 2022-08-09 DIAGNOSIS — F840 Autistic disorder: Secondary | ICD-10-CM | POA: Diagnosis not present

## 2022-08-09 MED ORDER — FLUOXETINE HCL 20 MG/5ML PO SOLN: 10.0000 mg | Freq: Every day | ORAL | 1 refills | Status: DC

## 2022-08-09 MED ORDER — ARIPIPRAZOLE 5 MG PO TABS: 5.0000 mg | ORAL_TABLET | Freq: Two times a day (BID) | ORAL | 2 refills | Status: DC

## 2022-08-09 MED ORDER — TRAZODONE HCL 50 MG PO TABS: 100.0000 mg | ORAL_TABLET | Freq: Every day | ORAL | 2 refills | Status: DC

## 2022-08-09 MED ORDER — CLONIDINE HCL 0.1 MG PO TABS: 0.1000 mg | ORAL_TABLET | Freq: Three times a day (TID) | ORAL | 2 refills | Status: DC

## 2022-08-21 ENCOUNTER — Telehealth (HOSPITAL_COMMUNITY): Payer: Self-pay | Admitting: *Deleted

## 2022-08-21 NOTE — Telephone Encounter (Signed)
Fax received for prior authorization of Abilify 5mg . Called [place] [facility] spoke with Patty who gave approval until 08/16/23 with Berkley Harvey #[id number]. Called to notify pharmacy.

## 2022-09-13 ENCOUNTER — Encounter (HOSPITAL_COMMUNITY): Payer: Self-pay | Admitting: Student in an Organized Health Care Education/Training Program

## 2022-09-13 ENCOUNTER — Ambulatory Visit (INDEPENDENT_AMBULATORY_CARE_PROVIDER_SITE_OTHER): Payer: Medicaid Other | Admitting: Student in an Organized Health Care Education/Training Program

## 2022-09-13 DIAGNOSIS — F840 Autistic disorder: Secondary | ICD-10-CM

## 2022-09-13 MED ORDER — CLONIDINE HCL 0.1 MG PO TABS: 0.1000 mg | ORAL_TABLET | Freq: Two times a day (BID) | ORAL | 2 refills | Status: DC

## 2022-09-13 MED ORDER — ARIPIPRAZOLE 5 MG PO TABS: 5.0000 mg | ORAL_TABLET | Freq: Two times a day (BID) | ORAL | 2 refills | Status: DC

## 2022-09-13 MED ORDER — ACETYLCYSTEINE 600 MG PO CAPS: 1200.0000 mg | ORAL_CAPSULE | Freq: Two times a day (BID) | ORAL | 3 refills | Status: DC

## 2022-09-13 MED ORDER — FLUOXETINE HCL 20 MG/5ML PO SOLN: 10.0000 mg | Freq: Every day | ORAL | 1 refills | Status: DC

## 2022-09-13 MED ORDER — TRAZODONE HCL 50 MG PO TABS: 100.0000 mg | ORAL_TABLET | Freq: Every day | ORAL | 2 refills | Status: DC

## 2022-09-13 MED ORDER — CLONIDINE HCL 0.2 MG PO TABS: 0.2000 mg | ORAL_TABLET | Freq: Two times a day (BID) | ORAL | 2 refills | Status: DC

## 2022-09-13 NOTE — Patient Instructions (Signed)
Primary Care Provider Options   [facility] [address], [place], [place], [address] Monday-Friday: 8 AM-4:30 PM Phone [phone] Family medicine  [facility] [address], [place], [place] [address] Monday-Friday: 8 AM-5 PM Phone [phone] [facility]  [facility] [address], [place], [place] [address] Monday-Friday: 8 AM-5:30 PM Phone: [phone] Primary CARE

## 2022-09-13 NOTE — Progress Notes (Signed)
BH MD/PA/NP OP Progress Note  09/13/2022 1:12 PM Gabrielle [name]  MRN:  [id number]  Chief Complaint:  Chief Complaint  Patient presents with   Follow-up   HPI: Gabrielle [name] is a 22 year old patient with a history of autism, skin picking excoriation disorder, tic, developmental delay and akathisia of unknown etiology.  Patient presents today with her mother.  Per mother, patient is nonverbal at baseline.   Patient is compliant with the following medication regimen: Abilify 5 mg twice daily NAC 1200 mg twice daily Trazodone 100 mg nightly Prozac 10 mg (liquid solution) (20mg  week before period if noted to be more aggressive and irritable) Clonidine 0.1 mg 3 times daily   Per mom, [name] is not doing well. Mom endorses that the last month has been very difficult for her and she is feeling overwhelmed more than usual. Mom came with notes that she wrote as well as notes from the day program.  Day Program notes: Gabrielle [name]) has undressed herself multiple times, sometimes as many as 3x in 1 day. She has had to be put back in overalls.   Mom Notes:  Patient has been having more frequent nocturnal enuresis and has been more difficult to get up for bed wetting alarms Patient grabbed her new staff member in the home. This personnel is also hard of hearing (similar to the one at her day program) and Gabrielle grabbed her collar, not the personnel is a bit threatened by Gabrielle and mom, feels like Gabrielle may enjoy this to some degree She is no longer allowed to ride the day programs shuttle and mom is having to take her and this is difficult. Patient is still grabbing for the steering wheel. With the day program she was doing this and hurting herself and staff in the act. Patient also still tries to jump out of any vehicle when it stops Gabrielle is grabbing more at her mom and others. This may be due to the way her day program personnel interacts with her, but it is becoming more of a problem.  She will be getting a new  PCP soon, in a new clinic given her PCP has graduated residency and mom and insurance suggested another office that may be able to have more consistency than residency clinic.   Patient is eating at the table now, but she her impulsivity with eating has increased again as well.  Skin picking- has improved, but she is hair pulling again.    Visit Diagnosis:    ICD-10-CM   1. Autism  F84.0 Acetylcysteine 600 MG CAPS    ARIPiprazole (ABILIFY) 5 MG tablet    FLUoxetine (PROZAC) 20 MG/5ML solution    traZODone (DESYREL) 50 MG tablet    cloNIDine (CATAPRES) 0.1 MG tablet    cloNIDine (CATAPRES) 0.2 MG tablet      Past Psychiatric History: 10/02/2021-patient was not having excoriating behaviors, medications were continued. 09/2021-patient started on Prozac 10 mg, liquid form and Abilify 10 mg daily changed to 5 mg twice daily 10/30/2021-decrease Prozac back to 10 mg (2.5 mL), continued Abilify 5 mg twice daily, continue trazodone 100 mg nightly, started patient on Cogentin 1 mg daily 01/15/2022: Increase clonidine to 0.1 mg 3 times daily due to worsening skin picking behaviors, continue all other medications.  Patient was also no longer bedwetting with the addition of Cogentin 1 mg daily.  Patient no longer appeared hypomanic on this assessment, was no longer eating animal food or garbage. 02/15/2022: Mom concern for possible TD  like movements with the mouth, decrease patient's Cogentin to 0.5 mg daily also suggested trying a scarf as patient appears to like picking at her neck and hanging onto her shirts and Juluis Rainier of her dresses.  Continue all other medications. 03/15/2022: Patient was extremely irritable and parents were endorsing aggression.  Patient was tearful and irritable during assessment and cannot be calmed down on previous assessments.  Patient was due for her IUD to be changed.  Mom was endorsing changes in behavior and patient endorsing pain that were reminiscent of symptoms of PMDD,  discussed with mom increasing patient's Prozac to 20 mg in the week before she starts her period as this may help with some of the behavior changes.  Patient scheduled the IUD change in 04/2022-we will also get labs at this time. 04/2022-patient doing fairly well and no medication adjustments made.  Patient bedwetting did return with the discontinuation of Cogentin however she is no longer having the lip smacking movements therefore it was not restarted.  Patient's behavior appeared to return more similar to baseline after her IUD was placed. 06/2022- Bedwetting picking up, but mom not very concerned, some emesis noted in the AM but again mom not concerned because it appeared to be more related to allergies and had happened around the same time of year in the past. No med adjustments   First visit: Patient used to be seen by Dr. Jannifer Franklin, was later transferred to Dr. Lorenz Coaster.  Patient was last seen by Dr. Lorenz Coaster (pediatric neurologist) 4/10 who endorsed that patient should return to psychiatric care due to complexity of presentation.   Previous medications include: Celexa (1 week trial, mom reports patient appeared to have trouble sleeping and a flatter affect), Lexapro, Luvox (failed), Zoloft, Focalin (became more agitated and dangerous to self) ropinirole, BuSpar, gabapentin, Trileptal, Lamictal, clonidine, Vistaril, NAC, Valium (prior to procedures), Klonopin, Versed (prior to procedures), Ativan, triazolam (prior to procedures)  Mom endorse some concerns that when patient has tried SSRIs in the past she appears to become more "manic."  When asked for further details Mom reports that patient will become more restless, have outbursts of giggling and her sleep worsens.  Per both parents patient has always had significant trouble sleeping until she was started on trazodone.  Medications at initial visit: Trazodone 100 mg (mom feels is over sedating but prefers 75 mg), Abilify 10 mg (treating  agitation, irritability and impulsivity fairly well) clonidine 2 mg daily, NAC 2400 mg (per neurology for skin excoriation)  Past Medical History:  Past Medical History:  Diagnosis Date   Anemia    Anxiety    Autism disorder    per mother severe autism, very social,  good receptive languange but nonverbal (can do some sign languange) , high tolerence to pain   Dysmenorrhea    Menorrhagia    Nonverbal    PT AUTISTIC   PONV (postoperative nausea and vomiting)    per mother "when wakes up pt can go from 0 to 60 and starts taking off cords and try to sit up to stand, better when mother and father are they when she wakes up"   Repetitive movement    due to autism---  chronic picking of own skin and body    Past Surgical History:  Procedure Laterality Date   DENTAL RESTORATION/EXTRACTION WITH X-RAY     DILATION AND CURETTAGE OF UTERUS N/A 04/07/2017   Procedure: DILATATION AND CURETTAGE;  Surgeon: Jerene Bears, MD;  Location: [facility];  Service: Gynecology;  Laterality: N/A;   INTRAUTERINE DEVICE (IUD) INSERTION N/A 04/07/2017   Procedure: PLACEMENT OF Rutha Bouchard  IUD;  Surgeon: Jerene Bears, MD;  Location: [facility];  Service: Gynecology;  Laterality: N/A;   INTRAUTERINE DEVICE (IUD) INSERTION N/A 04/23/2022   Procedure: INTRAUTERINE DEVICE (IUD) INSERTION;  Surgeon: Jerene Bears, MD;  Location: [facility];  Service: Gynecology;  Laterality: N/A;   IUD REMOVAL N/A 04/23/2022   Procedure: INTRAUTERINE DEVICE (IUD) REMOVAL;  Surgeon: Jerene Bears, MD;  Location: [facility];  Service: Gynecology;  Laterality: N/A;   MASS EXCISION Left 06/15/2015   Procedure: EXCISION OF LEFT AXILLARY MASS/TETANUS INJECTION;  Surgeon: Peggye Form, DO;  Location: [facility];  Service: Plastics;  Laterality: Left;   OPERATIVE ULTRASOUND N/A 04/23/2022   Procedure: OPERATIVE ULTRASOUND;  Surgeon: Jerene Bears, MD;  Location:  [facility];  Service: Gynecology;  Laterality: N/A;    Family Psychiatric History: Parents report some history of family members with depression    Family History:  Family History  Problem Relation Age of Onset   Heart disease Maternal Grandmother    Endometriosis Maternal Grandmother    Diabetes Maternal Grandfather    Cancer Maternal Grandfather        Skin Cancer   Asthma Mother    Hyperlipidemia Mother    Hypertension Paternal Grandfather    Hypertension Father    Hyperlipidemia Father    Diverticulosis Father    Arthritis Paternal Grandmother     Social History:  Social History   Socioeconomic History   Marital status: Single    Spouse name: Not on file   Number of children: Not on file   Years of education: Not on file   Highest education level: Not on file  Occupational History   Occupation: Medical illustrator in [place]  Tobacco Use   Smoking status: Never   Smokeless tobacco: Never  Vaping Use   Vaping Use: Never used  Substance and Sexual Activity   Alcohol use: Never   Drug use: Never   Sexual activity: Never    Birth control/protection: None  Other Topics Concern   Not on file  Social History Narrative   Patient lives with both parents. [name] graduated from [employer]. She is now at [employer]       Behavior specialist consult with mom once a month.    Therapies: Not at the moment.    Social Determinants of Health   Financial Resource Strain: Not on file  Food Insecurity: Not on file  Transportation Needs: Not on file  Physical Activity: Not on file  Stress: Not on file  Social Connections: Not on file    Allergies:  Allergies  Allergen Reactions   Cleaner [Ao-Sept Disinfection-Neutral] Hives    BRAND NAME - KABOOM    Metabolic Disorder Labs: Lab Results  Component Value Date   HGBA1C 5.3 04/23/2022   MPG 105.41 04/23/2022   MPG 108.28 08/11/2019   Lab Results  Component Value Date   PROLACTIN 7.9  04/23/2022   Lab Results  Component Value Date   CHOL 181 04/23/2022   TRIG 68 04/23/2022   HDL 31 (L) 04/23/2022   CHOLHDL 5.8 04/23/2022   VLDL 14 04/23/2022   LDLCALC 136 (H) 04/23/2022   LDLCALC 102 (H) 08/11/2019   Lab Results  Component Value Date   TSH 2.564 04/23/2022   TSH 2.188  08/11/2019    Therapeutic Level Labs: No results found for: "LITHIUM" No results found for: "VALPROATE" No results found for: "CBMZ"  Current Medications: Current Outpatient Medications  Medication Sig Dispense Refill   cloNIDine (CATAPRES) 0.2 MG tablet Take 1 tablet (0.2 mg total) by mouth 2 (two) times daily. Start 0.2mg  daily for 2 weeks then increase to QAM and 1400. With increase to 2x/day dosing, stop the 1400 0.1mg  tablet. 60 tablet 2   Acetylcysteine 600 MG CAPS Take 2 capsules (1,200 mg total) by mouth 2 (two) times daily. 120 capsule 3   ARIPiprazole (ABILIFY) 5 MG tablet Take 1 tablet (5 mg total) by mouth 2 (two) times daily. 60 tablet 2   cloNIDine (CATAPRES) 0.1 MG tablet Take 1 tablet (0.1 mg total) by mouth 2 (two) times daily. 1400/  1800 60 tablet 2   FLUoxetine (PROZAC) 20 MG/5ML solution Take 2.5 mLs (10 mg total) by mouth daily. Can take 5mL in the week before period, but decrease back to 2.105mL dose when period starts 120 mL 1   ibuprofen (ADVIL) 200 MG tablet Take 400 mg by mouth every 6 (six) hours as needed for fever, headache or mild pain.     KYLEENA 19.5 MG IUD 1 Device by Intrauterine route once for 1 dose. 1 Intra Uterine Device 0   Multiple Vitamin (MULTIVITAMIN) tablet Take 1 tablet by mouth daily.     mupirocin ointment (BACTROBAN) 2 % Apply 1 application topically 2 (two) times daily. (Patient taking differently: Apply 1 application  topically 2 (two) times daily as needed.) 22 g 0   traZODone (DESYREL) 50 MG tablet Take 2 tablets (100 mg total) by mouth at bedtime. 60 tablet 2   No current facility-administered medications for this visit.      Musculoskeletal: Strength & Muscle Tone: within normal limits Gait & Station: normal Patient leans: N/A  Psychiatric Specialty Exam: Review of Systems  Psychiatric/Behavioral:  Positive for behavioral problems.     There were no vitals taken for this visit.There is no height or weight on file to calculate BMI.  General Appearance:  Basline, only 1 open sore on L elbow none on neck, wearing overalls  Eye Contact:  Poor  Speech:  NA  Volume:  Increased but baseline  Mood:   normal objectively happy  Affect:  NA  Thought Process:  NA  Orientation:  NA  Thought Content: NA   Suicidal Thoughts:  No  Homicidal Thoughts:  No  Memory:  NA  Judgement:  Poor  Insight:  Lacking  Psychomotor Activity:  Normal/ increased , but baseline for patient  Concentration:  Concentration: Poor  Recall:  NA  Fund of Knowledge: Poor  Language: Poor  Akathisia:  No  Handed:    AIMS (if indicated): not done  Assets:  Housing Social Support  ADL's:  Impaired  Cognition: Impaired,  Severe  Sleep:  Good   Screenings: Flowsheet Row ED from 10/03/2020 in [facility])  C-SSRS RISK CATEGORY No Risk        Assessment and Plan: Mom endorses patient becoming more aggressive and generally impulsive over the last month. What is concerning is that mom is started to feel overwhelmed and is having more safety concerns with patient's behavior. Patient is still growing and it does appear she needs a medication adjustment. Due to general impulsivity issues and more aggressive behaviors will increase QAM Clonidine to 0.2mg  and in 2 weeks if patient has tolerated  well, will increase midday dose of Clonidine to 0.2mg  as well. Mom was on board with medication adjustments. May want to consider in the future if necessary changing to [place] (ER Clonidine).   A future option may need to be considering changing to Abilify to Risperidone.   Mom is feeling very unsupported  by [facility], in her efforts to make sure the patient is safe and receiving appropriate care. Mom is considering filing a complaint due to feeling restricted with no guidance in resources, from [place], to [facility].   Autism Skin picking excoriation disorder Tics - Continue Abilify 5 mg twice daily - Continue trazodone 100 mg nightly - Continue Prozac 10 mg (liquid form), for skin picking excoriation and other repetitive behaviors (week before.  Increase to 20 mg or 5 mL-then decrease back to 10 mg / 2.5 mL once period starts) - Increase clonidine to 0.2mg  QAM /0.1mg  at 2pm/ 0.1 mg at bedtime for 2 weeks, if patient tolerates well, then increase to 0.2mg  QAM/ 0.2mg  at 2pm/ 0.1mg  QHS - Continue N-acetylcysteine 1200 mg twice daily - F/U in 4 weeks   Hx akathisia, etiology unknown -Abnormal lip movements (concern for TD) when on Cogentin, resolved now that Cogentin has been discontinued (04/2022)   Collaboration of Care: Collaboration of Care:   Patient/Guardian was advised Release of Information must be obtained prior to any record release in order to collaborate their care with an outside provider. Patient/Guardian was advised if they have not already done so to contact the registration department to sign all necessary forms in order for [place] to release information regarding their care.   Consent: Patient/Guardian gives verbal consent for treatment and assignment of benefits for services provided during this visit. Patient/Guardian expressed understanding and agreed to proceed.   PGY-3 Bobbye Morton, MD 09/13/2022, 1:12 PM

## 2022-10-15 ENCOUNTER — Ambulatory Visit (INDEPENDENT_AMBULATORY_CARE_PROVIDER_SITE_OTHER): Payer: MEDICAID | Admitting: Student

## 2022-10-15 DIAGNOSIS — L659 Nonscarring hair loss, unspecified: Secondary | ICD-10-CM | POA: Diagnosis not present

## 2022-10-15 DIAGNOSIS — F89 Unspecified disorder of psychological development: Secondary | ICD-10-CM | POA: Diagnosis not present

## 2022-10-15 DIAGNOSIS — F840 Autistic disorder: Secondary | ICD-10-CM | POA: Diagnosis not present

## 2022-10-15 DIAGNOSIS — F984 Stereotyped movement disorders: Secondary | ICD-10-CM

## 2022-10-15 NOTE — Progress Notes (Incomplete)
BH MD/PA/NP OP Progress Note  10/15/2022 11:45 PM Gabrielle [name]  MRN:  [id number]  Chief Complaint:  Chief Complaint  Patient presents with  . Follow-up  . Agitation  . 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  Patient presents today with her mother.  Per mother, patient is nonverbal at baseline.   Patient is compliant with the following medication regimen: Abilify 5 mg twice daily NAC 1200 mg twice daily Trazodone 75 mg nightly Prozac 10 mg (liquid solution) (20mg  week before period if noted to be more aggressive and irritable) Clonidine 0.2 mg qAM, 0.1 mg qPM, and 0.1 mg qHS   Per mom, Gabrielle [name] is doing much better, near her baseline of behaviors since her last visit. Mom endorses that her behavior change may have been more cyclical or because the new caretaker, who Tatanya appeared to derive enjoyment from making her fearful, has chosen to resign. Mom says that with these changes, she isn't sure if the medications helped or if she would have gotten better on her own anyway. Additionally, Gabrielle [name] has only had one episode of nocturnal enuresis since her last visit.   Mom Notes:  Patient has been having more frequent nocturnal enuresis and has been more difficult to get up for bed wetting alarms Patient grabbed her new staff member in the home. This personnel is also hard of hearing (similar to the one at her day program) and CJ grabbed her collar, not the personnel is a bit threatened by CJ and mom, feels like CJ may enjoy this to some degree She is no longer allowed to ride the day programs shuttle and mom is having to take her and this is difficult. Patient is still grabbing for the steering wheel. With the day program she was doing this and hurting herself and staff in the act. Patient also still tries to jump out of any vehicle  when it stops CJ is grabbing more at her mom and others. This may be due to the way her day program personnel interacts with her, but it is becoming more of a problem.  She will be getting a new PCP soon, in a new clinic given her PCP has graduated residency and mom and insurance suggested another office that may be able to have more consistency than residency clinic.   Patient is eating at the table now, but she her impulsivity with eating has increased again as well.  Skin picking- has improved, but she is hair pulling again.    Visit Diagnosis:    ICD-10-CM   1. 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 Patchy loss of hair  L65.9     4. Developmental disability  F89        Past Psychiatric History: 10/02/2021-patient was not having excoriating behaviors, medications were continued. 09/2021-patient started on Prozac 10 mg, liquid form and Abilify 10 mg daily changed to 5 mg twice daily 10/30/2021-decrease Prozac back to 10 mg (2.5 mL), continued Abilify 5 mg twice daily, continue trazodone 100 mg nightly, started patient on Cogentin 1 mg daily 01/15/2022: Increase clonidine to 0.1 mg 3 times daily due to worsening skin picking behaviors, continue all other medications.  Patient was also no longer bedwetting with the addition of Cogentin 1 mg daily.  Patient no longer appeared hypomanic  on this assessment, was no longer eating animal food or garbage. 02/15/2022: Mom concern for possible TD like movements with the mouth, decrease patient's Cogentin to 0.5 mg daily also suggested trying a scarf as patient appears to like picking at her neck and hanging onto her shirts and [name] of her dresses.  Continue all other medications. 03/15/2022: Patient was extremely irritable and parents were endorsing aggression.  Patient was tearful and irritable during assessment and cannot be calmed down on previous assessments.  Patient was due for her IUD to be changed.  Mom was endorsing  changes in behavior and patient endorsing pain that were reminiscent of symptoms of PMDD, discussed with mom increasing patient's Prozac to 20 mg in the week before she starts her period as this may help with some of the behavior changes.  Patient scheduled the IUD change in 04/2022-we will also get labs at this time. 04/2022-patient doing fairly well and no medication adjustments made.  Patient bedwetting did return with the discontinuation of Cogentin however she is no longer having the lip smacking movements therefore it was not restarted.  Patient's behavior appeared to return more similar to baseline after her IUD was placed. 06/2022- Bedwetting picking up, but mom not very concerned, some emesis noted in the AM but again mom not concerned because it appeared to be more related to allergies and had happened around the same time of year in the past. No med adjustments   First visit: Patient used to be seen by Dr. Jannifer Franklin, was later transferred to Dr. Lorenz Coaster.  Patient was last seen by Dr. Lorenz Coaster (pediatric neurologist) 4/10 who endorsed that patient should return to psychiatric care due to complexity of presentation.   Previous medications include: Celexa (1 week trial, mom reports patient appeared to have trouble sleeping and a flatter affect), Lexapro, Luvox (failed), Zoloft, Focalin (became more agitated and dangerous to self) ropinirole, BuSpar, gabapentin, Trileptal, Lamictal, clonidine, Vistaril, NAC, Valium (prior to procedures), Klonopin, Versed (prior to procedures), Ativan, triazolam (prior to procedures)  Mom endorse some concerns that when patient has tried SSRIs in the past she appears to become more "manic."  When asked for further details Mom reports that patient will become more restless, have outbursts of giggling and her sleep worsens.  Per both parents patient has always had significant trouble sleeping until she was started on trazodone.  Medications at initial  visit: Trazodone 100 mg (mom feels is over sedating but prefers 75 mg), Abilify 10 mg (treating agitation, irritability and impulsivity fairly well) clonidine 2 mg daily, NAC 2400 mg (per neurology for skin excoriation)  Past Medical History:  Past Medical History:  Diagnosis Date  . Anemia   . Anxiety   . Autism disorder    per mother severe autism, very social,  good receptive languange but nonverbal (can do some sign languange) , high tolerence to pain  . Dysmenorrhea   . Menorrhagia   . Nonverbal    PT AUTISTIC  . PONV (postoperative nausea and vomiting)    per mother "when wakes up pt can go from 0 to 60 and starts taking off cords and try to sit up to stand, better when mother and father are they when she wakes up"  . Repetitive movement    due to autism---  chronic picking of own skin and body    Past Surgical History:  Procedure Laterality Date  . DENTAL RESTORATION/EXTRACTION WITH X-RAY    . DILATION AND CURETTAGE OF UTERUS N/A  04/07/2017   Procedure: DILATATION AND CURETTAGE;  Surgeon: Jerene Bears, MD;  Location: [facility];  Service: Gynecology;  Laterality: N/A;  . INTRAUTERINE DEVICE (IUD) INSERTION N/A 04/07/2017   Procedure: PLACEMENT OF Rutha Bouchard  IUD;  Surgeon: Jerene Bears, MD;  Location: [facility] [place];  Service: Gynecology;  Laterality: N/A;  . INTRAUTERINE DEVICE (IUD) INSERTION N/A 04/23/2022   Procedure: INTRAUTERINE DEVICE (IUD) INSERTION;  Surgeon: Jerene Bears, MD;  Location: [facility] [place];  Service: Gynecology;  Laterality: N/A;  . IUD REMOVAL N/A 04/23/2022   Procedure: INTRAUTERINE DEVICE (IUD) REMOVAL;  Surgeon: Jerene Bears, MD;  Location: [facility];  Service: Gynecology;  Laterality: N/A;  . MASS EXCISION Left 06/15/2015   Procedure: EXCISION OF LEFT AXILLARY MASS/TETANUS INJECTION;  Surgeon: Peggye Form, DO;  Location: [facility];  Service: Plastics;  Laterality: Left;  .  OPERATIVE ULTRASOUND N/A 04/23/2022   Procedure: OPERATIVE ULTRASOUND;  Surgeon: Jerene Bears, MD;  Location: [facility];  Service: Gynecology;  Laterality: N/A;    Family Psychiatric History: Parents report some history of family members with depression    Family History:  Family History  Problem Relation Age of Onset  . Heart disease Maternal Grandmother   . Endometriosis Maternal Grandmother   . Diabetes Maternal Grandfather   . Cancer Maternal Grandfather        Skin Cancer  . Asthma Mother   . Hyperlipidemia Mother   . Hypertension Paternal Grandfather   . Hypertension Father   . Hyperlipidemia Father   . Diverticulosis Father   . Arthritis Paternal Grandmother     Social History:  Social History   Socioeconomic History  . Marital status: Single    Spouse name: Not on file  . Number of children: Not on file  . Years of education: Not on file  . Highest education level: Not on file  Occupational History  . Occupation: Medical illustrator in [employer]  Tobacco Use  . Smoking status: Never  . Smokeless tobacco: Never  Vaping Use  . Vaping status: Never Used  Substance and Sexual Activity  . Alcohol use: Never  . Drug use: Never  . Sexual activity: Never    Birth control/protection: None  Other Topics Concern  . Not on file  Social History Narrative   Patient lives with both parents. [name] graduated from [employer]. She is now at [employer]       Behavior specialist consult with mom once a month.    Therapies: Not at the moment.    Social Determinants of Health   Financial Resource Strain: Not on file  Food Insecurity: Not on file  Transportation Needs: Not on file  Physical Activity: Not on file  Stress: Not on file  Social Connections: Not on file    Allergies:  Allergies  Allergen Reactions  . Cleaner [Ao-Sept Disinfection-Neutral] Hives    BRAND NAME - KABOOM    Metabolic Disorder Labs: Lab Results  Component Value Date    HGBA1C 5.3 04/23/2022   MPG 105.41 04/23/2022   MPG 108.28 08/11/2019   Lab Results  Component Value Date   PROLACTIN 7.9 04/23/2022   Lab Results  Component Value Date   CHOL 181 04/23/2022   TRIG 68 04/23/2022   HDL 31 (L) 04/23/2022   CHOLHDL 5.8 04/23/2022   VLDL 14 04/23/2022   LDLCALC 136 (H) 04/23/2022   LDLCALC 102 (H) 08/11/2019  Lab Results  Component Value Date   TSH 2.564 04/23/2022   TSH 2.188 08/11/2019    Therapeutic Level Labs: No results found for: "LITHIUM" No results found for: "VALPROATE" No results found for: "CBMZ"  Current Medications: Current Outpatient Medications  Medication Sig Dispense Refill  . Acetylcysteine 600 MG CAPS Take 2 capsules (1,200 mg total) by mouth 2 (two) times daily. 120 capsule 3  . ARIPiprazole (ABILIFY) 5 MG tablet Take 1 tablet (5 mg total) by mouth 2 (two) times daily. 60 tablet 2  . cloNIDine (CATAPRES) 0.1 MG tablet Take 1 tablet (0.1 mg total) by mouth 2 (two) times daily. 1400/  1800 60 tablet 2  . cloNIDine (CATAPRES) 0.2 MG tablet Take 1 tablet (0.2 mg total) by mouth 2 (two) times daily. Start 0.2mg  daily for 2 weeks then increase to QAM and 1400. With increase to 2x/day dosing, stop the 1400 0.1mg  tablet. 60 tablet 2  . FLUoxetine (PROZAC) 20 MG/5ML solution Take 2.5 mLs (10 mg total) by mouth daily. Can take 5mL in the week before period, but decrease back to 2.85mL dose when period starts 120 mL 1  . ibuprofen (ADVIL) 200 MG tablet Take 400 mg by mouth every 6 (six) hours as needed for fever, headache or mild pain.    . Multiple Vitamin (MULTIVITAMIN) tablet Take 1 tablet by mouth daily.    . mupirocin ointment (BACTROBAN) 2 % Apply 1 application topically 2 (two) times daily. (Patient taking differently: Apply 1 application  topically 2 (two) times daily as needed.) 22 g 0  . traZODone (DESYREL) 50 MG tablet Take 2 tablets (100 mg total) by mouth at bedtime. 60 tablet 2  . KYLEENA 19.5 MG IUD 1 Device by  Intrauterine route once for 1 dose. 1 Intra Uterine Device 0   No current facility-administered medications for this visit.     Musculoskeletal: Strength & Muscle Tone: within normal limits Gait & Station: normal Patient leans: N/A  Psychiatric Specialty Exam: Review of Systems  Psychiatric/Behavioral:  Positive for behavioral problems.     There were no vitals taken for this visit.There is no height or weight on file to calculate BMI.  General Appearance:  Basline, only 1 open sore on L elbow none on neck, wearing overalls  Eye Contact:  Poor  Speech:  NA  Volume:  Increased but baseline  Mood:   normal objectively happy  Affect:  NA  Thought Process:  NA  Orientation:  NA  Thought Content: NA   Suicidal Thoughts:  No  Homicidal Thoughts:  No  Memory:  NA  Judgement:  Poor  Insight:  Lacking  Psychomotor Activity:  Normal/ increased , but baseline for patient  Concentration:  Concentration: Poor  Recall:  NA  Fund of Knowledge: Poor  Language: Poor  Akathisia:  No  Handed:    AIMS (if indicated): not done  Assets:  Housing Social Support  ADL's:  Impaired  Cognition: Impaired,  Severe  Sleep:  Good   Screenings: Flowsheet Row ED from 10/03/2020 in [facility])  C-SSRS RISK CATEGORY No Risk        Assessment and Plan: Mom endorses patient's previously  aggressive and impulsive beh the last month. What is concerning is that mom is started to feel overwhelmed and is having more safety concerns with patient's behavior. Patient is still growing and it does appear she needs a medication adjustment. Due to general impulsivity issues and more aggressive behaviors  will increase QAM Clonidine to 0.2mg  and in 2 weeks if patient has tolerated well, will increase midday dose of Clonidine to 0.2mg  as well. Mom was on board with medication adjustments. May want to consider in the future if necessary changing to [place] (ER Clonidine).    A future option may need to be considering changing to Abilify to Risperidone.   Mom is feeling very unsupported by [facility], in her efforts to make sure the patient is safe and receiving appropriate care. Mom is considering filing a complaint due to feeling restricted with no guidance in resources, from [place], to [facility].   Autism Skin picking excoriation disorder Tics - Continue Abilify 5 mg twice daily - Decrease to trazodone 75 mg nightly - Continue Prozac 10 mg (liquid form), for skin picking excoriation and other repetitive behaviors (week before.  Increase to 20 mg or 5 mL-then decrease back to 10 mg / 2.5 mL once period starts) - Continue clonidine 0.2mg  QAM /0.1mg  at 2pm/ 0.1 mg at bedtime. If patient's behaviors become more impulsive, then increase to 0.2mg  QAM/ 0.2mg  at 2pm/ 0.1mg  QHS - Continue N-acetylcysteine 1200 mg twice daily - F/U in 6 weeks   Hx akathisia, etiology unknown -Abnormal lip movements (concern for TD) when on Cogentin, resolved now that Cogentin has been discontinued (04/2022)   Collaboration of Care: Collaboration of Care: Dr. Josephina Shih and Dr. Morrie Sheldon  Patient/Guardian was advised Release of Information must be obtained prior to any record release in order to collaborate their care with an outside provider. Patient/Guardian was advised if they have not already done so to contact the registration department to sign all necessary forms in order for [place] to release information regarding their care.   Consent: Patient/Guardian gives verbal consent for treatment and assignment of benefits for services provided during this visit. Patient/Guardian expressed understanding and agreed to proceed.   PGY-3 Lamar Sprinkles, MD 10/15/2022, 11:45 PM

## 2022-10-15 NOTE — Progress Notes (Signed)
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  Patient presents today with her mother.  Per mother, patient is nonverbal at baseline.   Patient is compliant with the following medication regimen: Abilify 5 mg twice daily NAC 1200 mg twice daily Trazodone 75 mg nightly Prozac 10 mg (liquid solution) (20mg  week before period if noted to be more aggressive and irritable) Clonidine 0.2 mg qAM, 0.1 mg qPM, and 0.1 mg qHS   Per mom, Gabrielle [name] is doing much better, near her baseline of behaviors since her last visit. Mom endorses that her behavior change may have been more cyclical or because the new caretaker, who Sally-Ann appeared to derive enjoyment from making her fearful, has chosen to resign. Mom says that with these changes, she isn't sure if the medications helped or if she would have gotten better on her own anyway. Additionally, Gabrielle [name] has only had one episode of nocturnal enuresis since her last visit.   She will be going a new PCP this afternoon, where they will attempt to obtain vitals .   Patient is sleeping through the night and awaking at 6 AM to go to the restroom, taking Trazodone 75 mg. 100 mg was too sedating, and she had more enuretic episodes. She continues eating at the table, and her impulsivity with eating has increased improved.  Skin picking- has improved, but she is hair pulling again. She has managed to remove a significant portion of her hair over the past two weeks.  Mom is still having difficulties with [name] obtaining the necessary safety equipment for [name] to ride safely in the day program's car (from which she is currently banned due to previous  behaviors).  Mom does not believe Gabrielle [name] to be a danger to herself or others at this time.    Visit Diagnosis:    ICD-10-CM   1. 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 Patchy loss of hair  L65.9     4. Developmental disability  F89        Past Psychiatric History: 10/02/2021-patient was not having excoriating behaviors, medications were continued. 09/2021-patient started on Prozac 10 mg, liquid form and Abilify 10 mg daily changed to 5 mg twice daily 10/30/2021-decrease Prozac back to 10 mg (2.5 mL), continued Abilify 5 mg twice daily, continue trazodone 100 mg nightly, started patient on Cogentin 1 mg daily 01/15/2022: Increase clonidine to 0.1 mg 3 times daily due to worsening skin picking behaviors, continue all other medications.  Patient was also no longer bedwetting with the addition of Cogentin 1 mg daily.  Patient no longer appeared hypomanic on this assessment, was no longer eating animal food or garbage. 02/15/2022: Mom concern for possible TD like movements with the mouth, decrease patient's Cogentin to 0.5 mg daily also suggested trying a scarf as patient appears to like picking at her neck and hanging onto her shirts and [name] of her dresses.  Continue all other medications. 03/15/2022: Patient was extremely irritable and parents were endorsing aggression.  Patient was tearful and irritable during assessment and cannot be calmed down on previous assessments.  Patient was due for  her IUD to be changed.  Mom was endorsing changes in behavior and patient endorsing pain that were reminiscent of symptoms of PMDD, discussed with mom increasing patient's Prozac to 20 mg in the week before she starts her period as this may help with some of the behavior changes.  Patient scheduled the IUD change in 04/2022-we will also get labs at this time. 04/2022-patient doing fairly well and no medication adjustments made.  Patient bedwetting did return with the discontinuation  of Cogentin however she is no longer having the lip smacking movements therefore it was not restarted.  Patient's behavior appeared to return more similar to baseline after her IUD was placed. 06/2022- Bedwetting picking up, but mom not very concerned, some emesis noted in the AM but again mom not concerned because it appeared to be more related to allergies and had happened around the same time of year in the past. No med adjustments 08/2022- Noted more aggressive behaviors, mom significantly concerned. Clonidine increased to 0.2 mg-0.1 mg-0.1 mg x 2 weeks, then may go to 0.2 mg-0.2 mg-0.1 mg.    First visit: Patient used to be seen by Dr. Jannifer Franklin, was later transferred to Dr. Lorenz Coaster.  Patient was last seen by Dr. Lorenz Coaster (pediatric neurologist) 4/10 who endorsed that patient should return to psychiatric care due to complexity of presentation.   Previous medications include: Celexa (1 week trial, mom reports patient appeared to have trouble sleeping and a flatter affect), Lexapro, Luvox (failed), Zoloft, Focalin (became more agitated and dangerous to self) ropinirole, BuSpar, gabapentin, Trileptal, Lamictal, clonidine, Vistaril, NAC, Valium (prior to procedures), Klonopin, Versed (prior to procedures), Ativan, triazolam (prior to procedures)  Mom endorse some concerns that when patient has tried SSRIs in the past she appears to become more "manic."  When asked for further details Mom reports that patient will become more restless, have outbursts of giggling and her sleep worsens.  Per both parents patient has always had significant trouble sleeping until she was started on trazodone.  Medications at initial visit: Trazodone 100 mg (mom feels is over sedating but prefers 75 mg), Abilify 10 mg (treating agitation, irritability and impulsivity fairly well) clonidine 2 mg daily, NAC 2400 mg (per neurology for skin excoriation)  Past Medical History:  Past Medical History:  Diagnosis Date    Anemia    Anxiety    Autism disorder    per mother severe autism, very social,  good receptive languange but nonverbal (can do some sign languange) , high tolerence to pain   Dysmenorrhea    Menorrhagia    Nonverbal    PT AUTISTIC   PONV (postoperative nausea and vomiting)    per mother "when wakes up pt can go from 0 to 60 and starts taking off cords and try to sit up to stand, better when mother and father are they when she wakes up"   Repetitive movement    due to autism---  chronic picking of own skin and body    Past Surgical History:  Procedure Laterality Date   DENTAL RESTORATION/EXTRACTION WITH X-RAY     DILATION AND CURETTAGE OF UTERUS N/A 04/07/2017   Procedure: DILATATION AND CURETTAGE;  Surgeon: Jerene Bears, MD;  Location: [facility];  Service: Gynecology;  Laterality: N/A;   INTRAUTERINE DEVICE (IUD) INSERTION N/A 04/07/2017   Procedure: PLACEMENT OF Rutha Bouchard  IUD;  Surgeon: Jerene Bears, MD;  Location[facility];  Service: Gynecology;  Laterality: N/A;   INTRAUTERINE  DEVICE (IUD) INSERTION N/A 04/23/2022   Procedure: INTRAUTERINE DEVICE (IUD) INSERTION;  Surgeon: Jerene Bears, MD;  Location: [facility];  Service: Gynecology;  Laterality: N/A;   IUD REMOVAL N/A 04/23/2022   Procedure: INTRAUTERINE DEVICE (IUD) REMOVAL;  Surgeon: Jerene Bears, MD;  Location: [facility];  Service: Gynecology;  Laterality: N/A;   MASS EXCISION Left 06/15/2015   Procedure: EXCISION OF LEFT AXILLARY MASS/TETANUS INJECTION;  Surgeon: Peggye Form, DO;  Location: [facility];  Service: Plastics;  Laterality: Left;   OPERATIVE ULTRASOUND N/A 04/23/2022   Procedure: OPERATIVE ULTRASOUND;  Surgeon: Jerene Bears, MD;  Location: [facility];  Service: Gynecology;  Laterality: N/A;    Family Psychiatric History: Parents report some history of family members with depression    Family History:   Family History  Problem Relation Age of Onset   Heart disease Maternal Grandmother    Endometriosis Maternal Grandmother    Diabetes Maternal Grandfather    Cancer Maternal Grandfather        Skin Cancer   Asthma Mother    Hyperlipidemia Mother    Hypertension Paternal Grandfather    Hypertension Father    Hyperlipidemia Father    Diverticulosis Father    Arthritis Paternal Grandmother     Social History:  Social History   Socioeconomic History   Marital status: Single    Spouse name: Not on file   Number of children: Not on file   Years of education: Not on file   Highest education level: Not on file  Occupational History   Occupation: Medical illustrator in [place]  Tobacco Use   Smoking status: Never   Smokeless tobacco: Never  Vaping Use   Vaping status: Never Used  Substance and Sexual Activity   Alcohol use: Never   Drug use: Never   Sexual activity: Never    Birth control/protection: None  Other Topics Concern   Not on file  Social History Narrative   Patient lives with both parents. [name] graduated from [employer]. She is now at [employer]       Behavior specialist consult with mom once a month.    Therapies: Not at the moment.    Social Determinants of Health   Financial Resource Strain: Not on file  Food Insecurity: Not on file  Transportation Needs: Not on file  Physical Activity: Not on file  Stress: Not on file  Social Connections: Not on file    Allergies:  Allergies  Allergen Reactions   Cleaner [Ao-Sept Disinfection-Neutral] Hives    BRAND NAME - KABOOM    Metabolic Disorder Labs: Lab Results  Component Value Date   HGBA1C 5.3 04/23/2022   MPG 105.41 04/23/2022   MPG 108.28 08/11/2019   Lab Results  Component Value Date   PROLACTIN 7.9 04/23/2022   Lab Results  Component Value Date   CHOL 181 04/23/2022   TRIG 68 04/23/2022   HDL 31 (L) 04/23/2022   CHOLHDL 5.8 04/23/2022   VLDL 14 04/23/2022   LDLCALC 136 (H)  04/23/2022   LDLCALC 102 (H) 08/11/2019   Lab Results  Component Value Date   TSH 2.564 04/23/2022   TSH 2.188 08/11/2019    Therapeutic Level Labs: No results found for: "LITHIUM" No results found for: "VALPROATE" No results found for: "CBMZ"  Current Medications: Current Outpatient Medications  Medication Sig Dispense Refill   Acetylcysteine 600 MG CAPS Take 2 capsules (1,200 mg total)  by mouth 2 (two) times daily. 120 capsule 3   ARIPiprazole (ABILIFY) 5 MG tablet Take 1 tablet (5 mg total) by mouth 2 (two) times daily. 60 tablet 2   cloNIDine (CATAPRES) 0.1 MG tablet Take 1 tablet (0.1 mg total) by mouth 2 (two) times daily. 1400/  1800 60 tablet 2   cloNIDine (CATAPRES) 0.2 MG tablet Take 1 tablet (0.2 mg total) by mouth 2 (two) times daily. Start 0.2mg  daily for 2 weeks then increase to QAM and 1400. With increase to 2x/day dosing, stop the 1400 0.1mg  tablet. 60 tablet 2   FLUoxetine (PROZAC) 20 MG/5ML solution Take 2.5 mLs (10 mg total) by mouth daily. Can take 5mL in the week before period, but decrease back to 2.41mL dose when period starts 120 mL 1   ibuprofen (ADVIL) 200 MG tablet Take 400 mg by mouth every 6 (six) hours as needed for fever, headache or mild pain.     Multiple Vitamin (MULTIVITAMIN) tablet Take 1 tablet by mouth daily.     mupirocin ointment (BACTROBAN) 2 % Apply 1 application topically 2 (two) times daily. (Patient taking differently: Apply 1 application  topically 2 (two) times daily as needed.) 22 g 0   traZODone (DESYREL) 50 MG tablet Take 2 tablets (100 mg total) by mouth at bedtime. 60 tablet 2   KYLEENA 19.5 MG IUD 1 Device by Intrauterine route once for 1 dose. 1 Intra Uterine Device 0   No current facility-administered medications for this visit.     Musculoskeletal: Strength & Muscle Tone: within normal limits Gait & Station: normal Patient leans: N/A  Psychiatric Specialty Exam: Review of Systems  Psychiatric/Behavioral:  Positive for  behavioral problems.     There were no vitals taken for this visit.There is no height or weight on file to calculate BMI.  General Appearance:  Baseline, appropriately healing sore on L elbow none on neck, wearing overalls. Patchy hair once lifted but hair from the crown of her head covers it appropriately.   Eye Contact:  Poor  Speech:  NA  Volume:  Increased but baseline  Mood:   normal objectively happy  Affect:  NA  Thought Process:  NA  Orientation:  NA  Thought Content: NA   Suicidal Thoughts:  No  Homicidal Thoughts:  No  Memory:  NA  Judgement:  Poor  Insight:  Lacking  Psychomotor Activity:  Normal/ increased , but baseline for patient  Concentration:  Concentration: Poor  Recall:  NA  Fund of Knowledge: Poor  Language: Poor  Akathisia:  No  Handed:    AIMS (if indicated): not done  Assets:  Housing Social Support  ADL's:  Impaired  Cognition: Impaired,  Severe  Sleep:  Good   Screenings: Flowsheet Row ED from 10/03/2020 in [facility])  C-SSRS RISK CATEGORY No Risk        Assessment and Plan: Mom endorses patient's previously  aggressive and impulsive behaviors last month have improved, and she appears at her baseline (from the last time this Clinical research associate observed patient). Mom is noting that patient alternates between skin picking and hair pulling. She is unsure whether Badia has had a positive response to the increase in Clonidine or if situational/environmental changes have impacted her (losing the most recent caretaker and what mom suspects to be the timing of her menstrual cycle). Mom advised to not make changes to Clonidine while Jolayne is at baseline, but to increase her midday dosage if Jovanni's  impulsivity and aggression worsen; mom is agreeable. Due to increased sedation and episodes of nocturnal enuresis with Trazodone 100 mg, mom had decreased dosage to 75 mg, which Jazmin appeared to tolerate much better. Mom even  considering decrease to 50 mg again, but would like to first consult with dad.   Of note, parents considering taking a trip with Emmy, and it will be her first time flying. Advised mom to reach out should this occur prior to our next appointment, and I will provide travel dosages of Halcion for agitation/anxiety.   Mom continues feeling very unsupported by [facility], in her efforts to make sure the patient is safe and receiving appropriate care. ISP submission deadline is 8/1, and there has been minimal attention to ensuring this will be completed, which has also been frustrating to mom. She filed a complaint to [facility], but she is also considering joining a IT sales professional due to feeling restricted with no guidance in resources and pushback from [facility].  Autism Skin picking excoriation disorder Tics - Continue Abilify 5 mg twice daily - Decrease to trazodone 75 mg nightly - Continue Prozac 10 mg (liquid form), for skin picking excoriation and other repetitive behaviors (week before.  Increase to 20 mg or 5 mL-then decrease back to 10 mg / 2.5 mL once period starts) - Continue clonidine 0.2mg  QAM /0.1mg  at 2pm/ 0.1 mg at bedtime. If patient's behaviors become more impulsive, then increase to 0.2mg  QAM/ 0.2mg  at 2pm/ 0.1mg  QHS - Continue N-acetylcysteine 1200 mg twice daily - F/U in 6 weeks   Hx akathisia, etiology unknown -Abnormal lip movements (concern for TD) when on Cogentin, resolved now that Cogentin has been discontinued (04/2022)   Collaboration of Care: Collaboration of Care: Dr. Josephina Shih and Dr. Morrie Sheldon  Patient/Guardian was advised Release of Information must be obtained prior to any record release in order to collaborate their care with an outside provider. Patient/Guardian was advised if they have not already done so to contact the registration department to sign all necessary forms in order for [place] to release information regarding their care.   Consent: Patient/Guardian  gives verbal consent for treatment and assignment of benefits for services provided during this visit. Patient/Guardian expressed understanding and agreed to proceed.   PGY-3 Lamar Sprinkles, MD 10/15/2022, 11:45 PM

## 2022-10-16 ENCOUNTER — Encounter (HOSPITAL_COMMUNITY): Payer: Self-pay | Admitting: Student

## 2022-10-17 NOTE — Addendum Note (Signed)
Addended by: Theodoro Kos A on: 10/17/2022 11:05 AM   Modules accepted: Level of Service

## 2022-11-19 ENCOUNTER — Ambulatory Visit (INDEPENDENT_AMBULATORY_CARE_PROVIDER_SITE_OTHER): Payer: MEDICAID | Admitting: Student

## 2022-11-19 DIAGNOSIS — F424 Excoriation (skin-picking) disorder: Secondary | ICD-10-CM | POA: Diagnosis not present

## 2022-11-19 DIAGNOSIS — L659 Nonscarring hair loss, unspecified: Secondary | ICD-10-CM

## 2022-11-19 DIAGNOSIS — F840 Autistic disorder: Secondary | ICD-10-CM | POA: Diagnosis not present

## 2022-11-19 DIAGNOSIS — F89 Unspecified disorder of psychological development: Secondary | ICD-10-CM | POA: Diagnosis not present

## 2022-11-19 NOTE — Progress Notes (Signed)
BH MD/PA/NP OP Progress Note  11/19/2022 10:51 AM Gabrielle [name]  MRN:  [id number]  Chief Complaint:  Chief Complaint  Patient presents with   Follow-up   Medication Refill   HPI: Gabrielle [name] is a 22 year old patient with a history of non-verbal autism, skin excoriation disorder, trichotillomania, tic disorder, developmental delay and akathisia of unknown etiology. Patient presents today with her mother.    Patient is compliant with the following medication regimen: Abilify 5 mg twice daily NAC 1200 mg twice daily Trazodone 75 mg nightly Prozac 10 mg (liquid solution) (20mg  week before period if noted to be more aggressive and irritable) Clonidine 0.2 mg qAM, 0.2 mg qPM, and 0.1 mg qHS   Per mom, Gabrielle [name] has sustained and even further improved behaviors since her last visit. Particularly, the past 5 days have been good for her. They have been free of outbursts, hair pulling, and skin picking. Additionally, she is waking to use the restroom in the middle of the night rather than voiding herself. The differences have been that the previous caretaker quit, Gabrielle [name] had not been going to her day program but staying home with mom, and her afternoon dose of Clonidine had been increased to 0.2 mg since her last visit. Gabrielle [name] has not been aggressive nor a danger to herself.   Mom is still having difficulties with [name] obtaining the necessary safety equipment for Gabrielle [name] to ride safely in the day program's car (from which she is currently banned due to previous behaviors).   Visit Diagnosis:    ICD-10-CM   1. Autism  F84.0     2. Patchy loss of hair  L65.9     3. Developmental disability  F89     4. 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 09/2021-patient started on Prozac 10 mg, liquid form and Abilify 10 mg daily changed to 5 mg twice daily 10/30/2021-decrease Prozac back to 10  mg (2.5 mL), continued Abilify 5 mg twice daily, continue trazodone 100 mg nightly, started patient on Cogentin 1 mg daily 01/15/2022: Increase clonidine to 0.1 mg 3 times daily due to worsening skin picking behaviors, continue all other medications.  Patient was also no longer bedwetting with the addition of Cogentin 1 mg daily.  Patient no longer appeared hypomanic on this assessment, was no longer eating animal food or garbage. 02/15/2022: Mom concern for possible TD like movements with the mouth, decrease patient's Cogentin to 0.5 mg daily also suggested trying a scarf as patient appears to like picking at her neck and hanging onto her shirts and Gabrielle [name] of her dresses.  Continue all other medications. 03/15/2022: Patient was extremely irritable and parents were endorsing aggression.  Patient was tearful and irritable during assessment and cannot be calmed down on previous assessments.  Patient was due for her IUD to be changed.  Mom was endorsing changes in behavior and patient endorsing pain that were reminiscent of symptoms of PMDD, discussed with mom increasing patient's Prozac to 20 mg in the week before she starts her period as this may help with some of the behavior changes.  Patient scheduled the IUD change in 04/2022-we will also get labs at this time. 04/2022-patient doing fairly well and no medication adjustments made.  Patient bedwetting did return with the discontinuation of Cogentin however she is no longer having the lip smacking movements therefore it was not restarted.  Patient's behavior appeared  to return more similar to baseline after her IUD was placed. 06/2022- Bedwetting picking up, but mom not very concerned, some emesis noted in the AM but again mom not concerned because it appeared to be more related to allergies and had happened around the same time of year in the past. No med adjustments 08/2022- Noted more aggressive behaviors, mom significantly concerned. Clonidine increased to 0.2  mg-0.1 mg-0.1 mg x 2 weeks, then may go to 0.2 mg-0.2 mg-0.1 mg.    First visit: Patient used to be seen by Dr. Jannifer Franklin, was later transferred to Dr. Lorenz Coaster.  Patient was last seen by Dr. Lorenz Coaster (pediatric neurologist) 4/10 who endorsed that patient should return to psychiatric care due to complexity of presentation.   Previous medications include: Celexa (1 week trial, mom reports patient appeared to have trouble sleeping and a flatter affect), Lexapro, Luvox (failed), Zoloft, Focalin (became more agitated and dangerous to self) ropinirole, BuSpar, gabapentin, Trileptal, Lamictal, clonidine, Vistaril, NAC, Valium (prior to procedures), Klonopin, Versed (prior to procedures), Ativan, triazolam (prior to procedures)  Mom endorse some concerns that when patient has tried SSRIs in the past she appears to become more "manic."  When asked for further details Mom reports that patient will become more restless, have outbursts of giggling and her sleep worsens.  Per both parents patient has always had significant trouble sleeping until she was started on trazodone.  Medications at initial visit: Trazodone 100 mg (mom feels is over sedating but prefers 75 mg), Abilify 10 mg (treating agitation, irritability and impulsivity fairly well) clonidine 2 mg daily, NAC 2400 mg (per neurology for skin excoriation)  Past Medical History:  Past Medical History:  Diagnosis Date   Anemia    Anxiety    Autism disorder    per mother severe autism, very social,  good receptive languange but nonverbal (can do some sign languange) , high tolerence to pain   Dysmenorrhea    Menorrhagia    Nonverbal    PT AUTISTIC   PONV (postoperative nausea and vomiting)    per mother "when wakes up pt can go from 0 to 60 and starts taking off cords and try to sit up to stand, better when mother and father are they when she wakes up"   Repetitive movement    due to autism---  chronic picking of own skin and body     Past Surgical History:  Procedure Laterality Date   DENTAL RESTORATION/EXTRACTION WITH X-RAY     DILATION AND CURETTAGE OF UTERUS N/A 04/07/2017   Procedure: DILATATION AND CURETTAGE;  Surgeon: Jerene Bears, MD;  [facility];  Service: Gynecology;  Laterality: N/A;   INTRAUTERINE DEVICE (IUD) INSERTION N/A 04/07/2017   Procedure: PLACEMENT OF Rutha Bouchard  IUD;  Surgeon: Jerene Bears, MD;  Location: [facility];  Service: Gynecology;  Laterality: N/A;   INTRAUTERINE DEVICE (IUD) INSERTION N/A 04/23/2022   Procedure: INTRAUTERINE DEVICE (IUD) INSERTION;  Surgeon: Jerene Bears, MD;  Location: [facility];  Service: Gynecology;  Laterality: N/A;   IUD REMOVAL N/A 04/23/2022   Procedure: INTRAUTERINE DEVICE (IUD) REMOVAL;  Surgeon: Jerene Bears, MD;  Location: [facility];  Service: Gynecology;  Laterality: N/A;   MASS EXCISION Left 06/15/2015   Procedure: EXCISION OF LEFT AXILLARY MASS/TETANUS INJECTION;  Surgeon: Peggye Form, DO;  Location: [facility];  Service: Plastics;  Laterality: Left;   OPERATIVE ULTRASOUND N/A 04/23/2022   Procedure:  OPERATIVE ULTRASOUND;  Surgeon: Jerene Bears, MD;  Location: [facility];  Service: Gynecology;  Laterality: N/A;    Family Psychiatric History: Parents report some history of family members with depression    Family History:  Family History  Problem Relation Age of Onset   Heart disease Maternal Grandmother    Endometriosis Maternal Grandmother    Diabetes Maternal Grandfather    Cancer Maternal Grandfather        Skin Cancer   Asthma Mother    Hyperlipidemia Mother    Hypertension Paternal Grandfather    Hypertension Father    Hyperlipidemia Father    Diverticulosis Father    Arthritis Paternal Grandmother     Social History:  Social History   Socioeconomic History   Marital status: Single    Spouse name: Not on file   Number of  children: Not on file   Years of education: Not on file   Highest education level: Not on file  Occupational History   Occupation: Medical illustrator in [place]  Tobacco Use   Smoking status: Never   Smokeless tobacco: Never  Vaping Use   Vaping status: Never Used  Substance and Sexual Activity   Alcohol use: Never   Drug use: Never   Sexual activity: Never    Birth control/protection: None  Other Topics Concern   Not on file  Social History Narrative   Patient lives with both parents. [name] graduated from [employer]. She is now at [employer]       Behavior specialist consult with mom once a month.    Therapies: Not at the moment.    Social Determinants of Health   Financial Resource Strain: Not on file  Food Insecurity: Not on file  Transportation Needs: Not on file  Physical Activity: Not on file  Stress: Not on file  Social Connections: Not on file    Allergies:  Allergies  Allergen Reactions   Cleaner [Ao-Sept Disinfection-Neutral] Hives    BRAND NAME - KABOOM    Metabolic Disorder Labs: Lab Results  Component Value Date   HGBA1C 5.3 04/23/2022   MPG 105.41 04/23/2022   MPG 108.28 08/11/2019   Lab Results  Component Value Date   PROLACTIN 7.9 04/23/2022   Lab Results  Component Value Date   CHOL 181 04/23/2022   TRIG 68 04/23/2022   HDL 31 (L) 04/23/2022   CHOLHDL 5.8 04/23/2022   VLDL 14 04/23/2022   LDLCALC 136 (H) 04/23/2022   LDLCALC 102 (H) 08/11/2019   Lab Results  Component Value Date   TSH 2.564 04/23/2022   TSH 2.188 08/11/2019    Therapeutic Level Labs: No results found for: "LITHIUM" No results found for: "VALPROATE" No results found for: "CBMZ"  Current Medications: Current Outpatient Medications  Medication Sig Dispense Refill   Acetylcysteine 600 MG CAPS Take 2 capsules (1,200 mg total) by mouth 2 (two) times daily. 120 capsule 3   ARIPiprazole (ABILIFY) 5 MG tablet Take 1 tablet (5 mg total) by mouth 2 (two) times  daily. 60 tablet 2   cloNIDine (CATAPRES) 0.1 MG tablet Take 1 tablet (0.1 mg total) by mouth 2 (two) times daily. 1400/  1800 60 tablet 2   cloNIDine (CATAPRES) 0.2 MG tablet Take 1 tablet (0.2 mg total) by mouth 2 (two) times daily. Start 0.2mg  daily for 2 weeks then increase to QAM and 1400. With increase to 2x/day dosing, stop the 1400 0.1mg  tablet. 60 tablet 2   FLUoxetine (  PROZAC) 20 MG/5ML solution Take 2.5 mLs (10 mg total) by mouth daily. Can take 5mL in the week before period, but decrease back to 2.75mL dose when period starts 120 mL 1   ibuprofen (ADVIL) 200 MG tablet Take 400 mg by mouth every 6 (six) hours as needed for fever, headache or mild pain.     KYLEENA 19.5 MG IUD 1 Device by Intrauterine route once for 1 dose. 1 Intra Uterine Device 0   Multiple Vitamin (MULTIVITAMIN) tablet Take 1 tablet by mouth daily.     mupirocin ointment (BACTROBAN) 2 % Apply 1 application topically 2 (two) times daily. (Patient taking differently: Apply 1 application  topically 2 (two) times daily as needed.) 22 g 0   traZODone (DESYREL) 50 MG tablet Take 2 tablets (100 mg total) by mouth at bedtime. 60 tablet 2   No current facility-administered medications for this visit.     Musculoskeletal: Strength & Muscle Tone: within normal limits Gait & Station: normal Patient leans: N/A  Psychiatric Specialty Exam: Review of Systems  Psychiatric/Behavioral:  Positive for behavioral problems.     There were no vitals taken for this visit.There is no height or weight on file to calculate BMI.  General Appearance:  Baseline, appropriately healing sore on L elbow none on neck, wearing overalls. Patchy hair (now growing back in some areas) once lifted but hair from the crown of her head covers it appropriately.   Eye Contact:  Fair  Speech:  NA  Volume:  Increased but baseline  Mood:   normal objectively happy  Affect:  NA  Thought Process:  NA  Orientation:  NA  Thought Content: NA   Suicidal  Thoughts:  No  Homicidal Thoughts:  No  Memory:  NA  Judgement:  Poor  Insight:  Lacking  Psychomotor Activity:  Normal/ increased , but baseline for patient  Concentration:  Concentration: Poor  Recall:  NA  Fund of Knowledge: Poor  Language: Poor  Akathisia:  No  Handed:    AIMS (if indicated): not done  Assets:  Housing Social Support  ADL's:  Impaired  Cognition: Impaired,  Severe  Sleep:  Good   Screenings: Flowsheet Row ED from 10/03/2020 in [facility])  C-SSRS RISK CATEGORY No Risk        Assessment and Plan: Mom endorses sustained improvements of aggressive, impulsive, and skin picking/hair pulling behaviors with increase in afternoon Clonidine to 0.2 mg and change in care environment. She continues to do well with 75 mg of Trazodone.   Mom continues feeling very unsupported by [facility], in her efforts to make sure the patient is safe and receiving appropriate care.  No changes to be made today.   Autism Skin picking excoriation disorder Tics - Continue Abilify 5 mg twice daily - Continue trazodone 75 mg nightly - Continue Prozac 10 mg (liquid form), for skin picking excoriation and other repetitive behaviors (week before.  Increase to 20 mg or 5 mL-then decrease back to 10 mg / 2.5 mL once period starts) - Continue clonidine 0.2mg  QAM /0.2 mg at 2pm/ 0.1 mg at bedtime. - Continue N-acetylcysteine 1200 mg twice daily - F/U in 6 weeks   Hx akathisia, etiology unknown -Abnormal lip movements (concern for TD) when on Cogentin, resolved now that Cogentin has been discontinued (04/2022)   Collaboration of Care: Collaboration of Care: Dr. Mercy Riding  Patient/Guardian was advised Release of Information must be obtained prior to any record release in  order to collaborate their care with an outside provider. Patient/Guardian was advised if they have not already done so to contact the registration department to sign all necessary  forms in order for [place] to release information regarding their care.   Consent: Patient/Guardian gives verbal consent for treatment and assignment of benefits for services provided during this visit. Patient/Guardian expressed understanding and agreed to proceed.   PGY-3 Lamar Sprinkles, MD 11/19/2022 10:51 AM

## 2022-11-20 MED ORDER — CLONIDINE HCL 0.1 MG PO TABS: 0.1000 mg | ORAL_TABLET | Freq: Two times a day (BID) | ORAL | 2 refills | Status: DC

## 2022-11-20 MED ORDER — ACETYLCYSTEINE 600 MG PO CAPS: 1200.0000 mg | ORAL_CAPSULE | Freq: Two times a day (BID) | ORAL | 2 refills | Status: DC

## 2022-11-20 MED ORDER — FLUOXETINE HCL 20 MG/5ML PO SOLN: 10.0000 mg | Freq: Every day | ORAL | 1 refills | Status: DC

## 2022-11-20 MED ORDER — TRAZODONE HCL 50 MG PO TABS: 100.0000 mg | ORAL_TABLET | Freq: Every day | ORAL | 2 refills | Status: DC

## 2022-11-20 MED ORDER — CLONIDINE HCL 0.2 MG PO TABS: 0.2000 mg | ORAL_TABLET | Freq: Two times a day (BID) | ORAL | 2 refills | Status: DC

## 2022-11-20 MED ORDER — ARIPIPRAZOLE 5 MG PO TABS: 5.0000 mg | ORAL_TABLET | Freq: Two times a day (BID) | ORAL | 2 refills | Status: DC

## 2022-11-22 NOTE — Addendum Note (Signed)
Addended by: Everlena Cooper on: 11/22/2022 09:02 AM   Modules accepted: Level of Service

## 2023-01-02 ENCOUNTER — Encounter: Payer: Self-pay | Admitting: Family Medicine

## 2023-01-07 ENCOUNTER — Telehealth (HOSPITAL_COMMUNITY): Payer: Self-pay

## 2023-01-07 NOTE — Telephone Encounter (Signed)
Is this for the refills?

## 2023-01-07 NOTE — Telephone Encounter (Signed)
Yes

## 2023-01-07 NOTE — Telephone Encounter (Signed)
Thanks, I will have Dr. Mercy Riding send them in!

## 2023-01-07 NOTE — Telephone Encounter (Signed)
Thank you :)

## 2023-01-07 NOTE — Telephone Encounter (Signed)
Pharmacy will not dispense patients medication because you are not a Medicaid approved provider, please resend under your supervising doctor.

## 2023-01-28 ENCOUNTER — Ambulatory Visit (INDEPENDENT_AMBULATORY_CARE_PROVIDER_SITE_OTHER): Payer: MEDICAID | Admitting: Student

## 2023-01-28 DIAGNOSIS — L659 Nonscarring hair loss, unspecified: Secondary | ICD-10-CM | POA: Diagnosis not present

## 2023-01-28 DIAGNOSIS — F984 Stereotyped movement disorders: Secondary | ICD-10-CM

## 2023-01-28 DIAGNOSIS — F424 Excoriation (skin-picking) disorder: Secondary | ICD-10-CM | POA: Diagnosis not present

## 2023-01-28 DIAGNOSIS — F840 Autistic disorder: Secondary | ICD-10-CM

## 2023-01-28 DIAGNOSIS — F89 Unspecified disorder of psychological development: Secondary | ICD-10-CM

## 2023-01-28 NOTE — Progress Notes (Signed)
BH MD/PA/NP OP Progress Note  01/28/2023 8:35 AM  Gabrielle [name]  MRN:  [id number]  Chief Complaint:  Chief Complaint  Patient presents with   Follow-up   HPI: Gabrielle [name] is a 22 year old patient with a history of non-verbal autism, skin excoriation disorder, trichotillomania, tic disorder, developmental delay and akathisia of unknown etiology. Patient presents today with her mother.    Patient is compliant with the following medication regimen: Abilify 5 mg twice daily NAC 1200 mg twice daily Trazodone 75 mg nightly Prozac 10 mg (liquid solution) (20mg  week before period if noted to be more aggressive and irritable) Clonidine 0.2 mg qAM, 0.2 mg qPM, and 0.1 mg qHS   Per mom, Gabrielle [name] has sustained and even further improved behaviors since her last visit. Particularly, 2 weeks were good for her without outbursts, excoriations or hair pulling.  Additionally, she is waking to use the restroom in the middle of the night rather than voiding herself. She is tolerating her medications well and responding well to the ncreased dose. Mom has not trialed pulsed dosing around patient's menses. Swayze has not been aggressive nor a danger to herself.   Mom is still having less difficulties with necessary safety equipment for Ashiah to ride safely in the day program's car, and mom is appreciative.   Visit Diagnosis:    ICD-10-CM   1. Autism  F84.0     2. Developmental disability  F89     3. Patchy loss of hair  L65.9     4. 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 09/2021-patient started on Prozac 10 mg, liquid form and Abilify 10 mg daily changed to 5 mg twice daily 10/30/2021-decrease Prozac back to 10 mg (2.5 mL), continued Abilify 5 mg twice daily, continue trazodone 100 mg nightly, started patient on Cogentin 1 mg daily 01/15/2022:  Increase clonidine to 0.1 mg 3 times daily due to worsening skin picking behaviors, continue all other medications.  Patient was also no longer bedwetting with the addition of Cogentin 1 mg daily.  Patient no longer appeared hypomanic on this assessment, was no longer eating animal food or garbage. 02/15/2022: Mom concern for possible TD like movements with the mouth, decrease patient's Cogentin to 0.5 mg daily also suggested trying a scarf as patient appears to like picking at her neck and hanging onto her shirts and [name] of her dresses.  Continue all other medications. 03/15/2022: Patient was extremely irritable and parents were endorsing aggression.  Patient was tearful and irritable during assessment and cannot be calmed down on previous assessments.  Patient was due for her IUD to be changed.  Mom was endorsing changes in behavior and patient endorsing pain that were reminiscent of symptoms of PMDD, discussed with mom increasing patient's Prozac to 20 mg in the week before she starts her period as this may help with some of the behavior changes.  Patient scheduled the IUD change in 04/2022-we will also get labs at this time. 04/2022-patient doing fairly well and no medication adjustments made.  Patient bedwetting did return with the discontinuation of Cogentin however she is no longer having the lip smacking movements therefore it was not restarted.  Patient's behavior appeared to return more similar to baseline after her IUD was placed. 06/2022- Bedwetting picking up, but mom not very concerned, some emesis noted in  the AM but again mom not concerned because it appeared to be more related to allergies and had happened around the same time of year in the past. No med adjustments 08/2022- Noted more aggressive behaviors, mom significantly concerned. Clonidine increased to 0.2 mg-0.1 mg-0.1 mg x 2 weeks, then may go to 0.2 mg-0.2 mg-0.1 mg.  10/2022- increased midday clonidine dosage.   First visit: Patient  used to be seen by Dr. Jannifer Franklin, was later transferred to Dr. Lorenz Coaster.  Patient was last seen by Dr. Lorenz Coaster (pediatric neurologist) 4/10 who endorsed that patient should return to psychiatric care due to complexity of presentation.   Previous medications include: Celexa (1 week trial, mom reports patient appeared to have trouble sleeping and a flatter affect), Lexapro, Luvox (failed), Zoloft, Focalin (became more agitated and dangerous to self) ropinirole, BuSpar, gabapentin, Trileptal, Lamictal, clonidine, Vistaril, NAC, Valium (prior to procedures), Klonopin, Versed (prior to procedures), Ativan, triazolam (prior to procedures)  Mom endorse some concerns that when patient has tried SSRIs in the past she appears to become more "manic."  When asked for further details Mom reports that patient will become more restless, have outbursts of giggling and her sleep worsens.  Per both parents patient has always had significant trouble sleeping until she was started on trazodone.  Medications at initial visit: Trazodone 100 mg (mom feels is over sedating but prefers 75 mg), Abilify 10 mg (treating agitation, irritability and impulsivity fairly well) clonidine 2 mg daily, NAC 2400 mg (per neurology for skin excoriation)  Past Medical History:  Past Medical History:  Diagnosis Date   Anemia    Anxiety    Autism disorder    per mother severe autism, very social,  good receptive languange but nonverbal (can do some sign languange) , high tolerence to pain   Dysmenorrhea    Menorrhagia    Nonverbal    PT AUTISTIC   PONV (postoperative nausea and vomiting)    per mother "when wakes up pt can go from 0 to 60 and starts taking off cords and try to sit up to stand, better when mother and father are they when she wakes up"   Repetitive movement    due to autism---  chronic picking of own skin and body    Past Surgical History:  Procedure Laterality Date   DENTAL RESTORATION/EXTRACTION WITH  X-RAY     DILATION AND CURETTAGE OF UTERUS N/A 04/07/2017   Procedure: DILATATION AND CURETTAGE;  Surgeon: Jerene Bears, MD;  Location: [facility];  Service: Gynecology;  Laterality: N/A;   INTRAUTERINE DEVICE (IUD) INSERTION N/A 04/07/2017   Procedure: PLACEMENT OF Rutha Bouchard  IUD;  Surgeon: Jerene Bears, MD;  Location: [facility];  Service: Gynecology;  Laterality: N/A;   INTRAUTERINE DEVICE (IUD) INSERTION N/A 04/23/2022   Procedure: INTRAUTERINE DEVICE (IUD) INSERTION;  Surgeon: Jerene Bears, MD;  Location: [facility];  Service: Gynecology;  Laterality: N/A;   IUD REMOVAL N/A 04/23/2022   Procedure: INTRAUTERINE DEVICE (IUD) REMOVAL;  Surgeon: Jerene Bears, MD;  Location: [facility];  Service: Gynecology;  Laterality: N/A;   MASS EXCISION Left 06/15/2015   Procedure: EXCISION OF LEFT AXILLARY MASS/TETANUS INJECTION;  Surgeon: Peggye Form, DO;  Location: [facility];  Service: Plastics;  Laterality: Left;   OPERATIVE ULTRASOUND N/A 04/23/2022   Procedure: OPERATIVE ULTRASOUND;  Surgeon: Jerene Bears, MD;  Location: [facility];  Service: Gynecology;  Laterality:  N/A;    Family Psychiatric History: Parents report some history of family members with depression    Family History:  Family History  Problem Relation Age of Onset   Heart disease Maternal Grandmother    Endometriosis Maternal Grandmother    Diabetes Maternal Grandfather    Cancer Maternal Grandfather        Skin Cancer   Asthma Mother    Hyperlipidemia Mother    Hypertension Paternal Grandfather    Hypertension Father    Hyperlipidemia Father    Diverticulosis Father    Arthritis Paternal Grandmother     Social History:  Social History   Socioeconomic History   Marital status: Single    Spouse name: Not on file   Number of children: Not on file   Years of education: Not on file   Highest education level: Not on file   Occupational History   Occupation: Medical illustrator in [place]  Tobacco Use   Smoking status: Never   Smokeless tobacco: Never  Vaping Use   Vaping status: Never Used  Substance and Sexual Activity   Alcohol use: Never   Drug use: Never   Sexual activity: Never    Birth control/protection: None  Other Topics Concern   Not on file  Social History Narrative   Patient lives with both parents. [name] graduated from [employer]. She is now at [employer]       Behavior specialist consult with mom once a month.    Therapies: Not at the moment.    Social Determinants of Health   Financial Resource Strain: Not on file  Food Insecurity: Not on file  Transportation Needs: Not on file  Physical Activity: Not on file  Stress: Not on file  Social Connections: Not on file    Allergies:  Allergies  Allergen Reactions   Cleaner [Ao-Sept Disinfection-Neutral] Hives    BRAND NAME - KABOOM    Metabolic Disorder Labs: Lab Results  Component Value Date   HGBA1C 5.3 04/23/2022   MPG 105.41 04/23/2022   MPG 108.28 08/11/2019   Lab Results  Component Value Date   PROLACTIN 7.9 04/23/2022   Lab Results  Component Value Date   CHOL 181 04/23/2022   TRIG 68 04/23/2022   HDL 31 (L) 04/23/2022   CHOLHDL 5.8 04/23/2022   VLDL 14 04/23/2022   LDLCALC 136 (H) 04/23/2022   LDLCALC 102 (H) 08/11/2019   Lab Results  Component Value Date   TSH 2.564 04/23/2022   TSH 2.188 08/11/2019    Therapeutic Level Labs: No results found for: "LITHIUM" No results found for: "VALPROATE" No results found for: "CBMZ"  Current Medications: Current Outpatient Medications  Medication Sig Dispense Refill   Acetylcysteine 600 MG CAPS Take 2 capsules (1,200 mg total) by mouth 2 (two) times daily. 120 capsule 2   ARIPiprazole (ABILIFY) 5 MG tablet Take 1 tablet (5 mg total) by mouth 2 (two) times daily. 60 tablet 2   cloNIDine (CATAPRES) 0.1 MG tablet Take 1 tablet (0.1 mg total) by mouth 2  (two) times daily. 1400/  1800 60 tablet 2   cloNIDine (CATAPRES) 0.2 MG tablet Take 1 tablet (0.2 mg total) by mouth 2 (two) times daily. Start 0.2mg  daily for 2 weeks then increase to QAM and 1400. With increase to 2x/day dosing, stop the 1400 0.1mg  tablet. 60 tablet 2   FLUoxetine (PROZAC) 20 MG/5ML solution Take 2.5 mLs (10 mg total) by mouth daily. Can take 5mL in the week  before period, but decrease back to 2.6mL dose when period starts 120 mL 1   ibuprofen (ADVIL) 200 MG tablet Take 400 mg by mouth every 6 (six) hours as needed for fever, headache or mild pain.     KYLEENA 19.5 MG IUD 1 Device by Intrauterine route once for 1 dose. 1 Intra Uterine Device 0   Multiple Vitamin (MULTIVITAMIN) tablet Take 1 tablet by mouth daily.     mupirocin ointment (BACTROBAN) 2 % Apply 1 application topically 2 (two) times daily. (Patient taking differently: Apply 1 application  topically 2 (two) times daily as needed.) 22 g 0   traZODone (DESYREL) 50 MG tablet Take 2 tablets (100 mg total) by mouth at bedtime. 60 tablet 2   No current facility-administered medications for this visit.     Musculoskeletal: Strength & Muscle Tone: within normal limits Gait & Station: normal Patient leans: N/A  Psychiatric Specialty Exam: Review of Systems  Psychiatric/Behavioral:  Positive for behavioral problems.     There were no vitals taken for this visit.There is no height or weight on file to calculate BMI.  General Appearance:  Baseline, appropriately healing sore on L elbow none on neck, wearing overalls. Patchy hair (now growing back in some areas) once lifted but hair from the crown of her head covers it appropriately.   Eye Contact:  Fair  Speech:  NA  Volume:  Increased but baseline  Mood:   normal objectively happy  Affect:  NA  Thought Process:  NA  Orientation:  NA  Thought Content: NA   Suicidal Thoughts:  No  Homicidal Thoughts:  No  Memory:  NA  Judgement:  Poor  Insight:  Lacking   Psychomotor Activity:  Normal/ increased , but baseline for patient  Concentration:  Concentration: Poor  Recall:  NA  Fund of Knowledge: Poor  Language: Poor  Akathisia:  No  Handed:    AIMS (if indicated): not done  Assets:  Housing Social Support  ADL's:  Impaired  Cognition: Impaired,  Severe  Sleep:  Good   Screenings: Flowsheet Row ED from 10/03/2020 in [facility])  C-SSRS RISK CATEGORY No Risk        Assessment and Plan: Mom endorses sustained improvements of aggressive, impulsive, and skin picking/hair pulling behaviors overall, with no hair pulling nor skin picking for two weeks, resuming when her menses began. She is tolerating her medications well for mood and impulsivity. As well, she continues to do well with 75 mg of Trazodone for sleep.   Patient's day program has begun using a seat belt buckle device for safety during transit, and mom is appreciative of that progress.  No changes to be made today.   Autism Skin picking excoriation disorder Tics - Continue Abilify 5 mg twice daily - Continue trazodone 75 mg nightly - Continue Prozac 10 mg (liquid form), for skin picking excoriation and other repetitive behaviors (week before.  Increase to 20 mg or 5 mL-then decrease back to 10 mg / 2.5 mL once period starts); next visit will assess risk vs benefit of increasing standing dose - Continue clonidine 0.2mg  QAM /0.2 mg at 2pm/ 0.1 mg at bedtime. - Continue N-acetylcysteine 1200 mg twice daily - F/U in 6 weeks   Hx akathisia, etiology unknown -Abnormal lip movements (concern for TD) when on Cogentin, resolved when Cogentin discontinued (04/2022)   Collaboration of Care: Collaboration of Care: Dr. Josephina Shih  Patient/Guardian was advised Release of Information must be  obtained prior to any record release in order to collaborate their care with an outside provider. Patient/Guardian was advised if they have not already done so  to contact the registration department to sign all necessary forms in order for [place] to release information regarding their care.   Consent: Patient/Guardian gives verbal consent for treatment and assignment of benefits for services provided during this visit. Patient/Guardian expressed understanding and agreed to proceed.   PGY-3 Lamar Sprinkles, MD 01/28/2023 8:35 AM

## 2023-01-30 ENCOUNTER — Ambulatory Visit (INDEPENDENT_AMBULATORY_CARE_PROVIDER_SITE_OTHER): Payer: MEDICAID | Admitting: Pediatrics

## 2023-02-28 ENCOUNTER — Telehealth (HOSPITAL_COMMUNITY): Payer: Self-pay

## 2023-02-28 DIAGNOSIS — F840 Autistic disorder: Secondary | ICD-10-CM

## 2023-02-28 MED ORDER — ARIPIPRAZOLE 5 MG PO TABS: 5.0000 mg | ORAL_TABLET | Freq: Two times a day (BID) | ORAL | 2 refills | Status: DC

## 2023-02-28 MED ORDER — ACETYLCYSTEINE 600 MG PO CAPS: 1200.0000 mg | ORAL_CAPSULE | Freq: Two times a day (BID) | ORAL | 2 refills | Status: DC

## 2023-02-28 MED ORDER — CLONIDINE HCL 0.2 MG PO TABS: 0.2000 mg | ORAL_TABLET | Freq: Two times a day (BID) | ORAL | 2 refills | Status: DC

## 2023-02-28 MED ORDER — TRAZODONE HCL 50 MG PO TABS: 75.0000 mg | ORAL_TABLET | Freq: Every day | ORAL | 2 refills | Status: DC

## 2023-02-28 MED ORDER — FLUOXETINE HCL 20 MG/5ML PO SOLN: 10.0000 mg | Freq: Every day | ORAL | 1 refills | Status: DC

## 2023-02-28 MED ORDER — CLONIDINE HCL 0.1 MG PO TABS: 0.1000 mg | ORAL_TABLET | Freq: Every evening | ORAL | 2 refills | Status: DC

## 2023-02-28 NOTE — Telephone Encounter (Signed)
Medication problem - Call from patient's Mother and reported guardian stating she needed new orders for all of patient's prescribed medications to be sent into their [facility], claiming her pharmacy could not fill prescriptions from Dr. Alfonse Flavors at this time due to her not being recognized on patient's Medicaid panel.  Collateral reported this had happened once previously and agreed to send to one of patient's attendings to see if they would reorder all patient's medications as patient's guardian stated needing a refill of all.  Patient's next appointment 03/19/22.

## 2023-02-28 NOTE — Telephone Encounter (Signed)
Medication management - Telephone call with Herbert Seta, pharmacist at [facility] to clarify for them the correct dosages for patient's prescribed Clonidine 0.2 mg and 0.1 mg dosages. Reviewed patient's record and from Dr. Olene Craven note 01/28/23, verified the orders.  Patient's Clonidine 0.2 mg to be given twice a day, each morning and then at 1400.  Patient's Clonidine 0.1 mg dosage to be given once a day, not #60 but #30 at 1800 each day.  Collateral stated understanding and will fill order as verified and per providers recorded instructions.

## 2023-03-20 ENCOUNTER — Ambulatory Visit (HOSPITAL_COMMUNITY): Payer: MEDICAID | Admitting: Student

## 2023-03-20 ENCOUNTER — Other Ambulatory Visit (HOSPITAL_BASED_OUTPATIENT_CLINIC_OR_DEPARTMENT_OTHER): Payer: Self-pay | Admitting: Obstetrics & Gynecology

## 2023-03-20 DIAGNOSIS — L659 Nonscarring hair loss, unspecified: Secondary | ICD-10-CM

## 2023-03-20 DIAGNOSIS — G2569 Other tics of organic origin: Secondary | ICD-10-CM

## 2023-03-20 DIAGNOSIS — N946 Dysmenorrhea, unspecified: Secondary | ICD-10-CM

## 2023-03-20 DIAGNOSIS — F424 Excoriation (skin-picking) disorder: Secondary | ICD-10-CM | POA: Diagnosis not present

## 2023-03-20 DIAGNOSIS — Z79899 Other long term (current) drug therapy: Secondary | ICD-10-CM

## 2023-03-20 DIAGNOSIS — F89 Unspecified disorder of psychological development: Secondary | ICD-10-CM | POA: Diagnosis not present

## 2023-03-20 DIAGNOSIS — F840 Autistic disorder: Secondary | ICD-10-CM

## 2023-03-20 DIAGNOSIS — F984 Stereotyped movement disorders: Secondary | ICD-10-CM

## 2023-03-20 MED ORDER — NORETHIN ACE-ETH ESTRAD-FE 1-20 MG-MCG PO TABS: 1.0000 | ORAL_TABLET | Freq: Every day | ORAL | 1 refills | Status: AC

## 2023-03-20 NOTE — Progress Notes (Signed)
 BH MD/PA/NP OP Progress Note  03/20/2023  8:35 AM  ASAKO SALIBA  MRN:  [id number]  Chief Complaint:  Chief Complaint  Patient presents with   Follow-up   Medication Refill   HPI: Junice Fei is a 23 year old patient with a history of non-verbal autism, skin excoriation disorder, trichotillomania, tic disorder, developmental delay and akathisia of unknown etiology. Patient presents today with her mother.    Patient is compliant with the following medication regimen: Abilify  5 mg twice daily NAC 1200 mg twice daily Trazodone  75 mg nightly Prozac  10 mg (liquid solution)  Clonidine  0.2 mg qAM, 0.2 mg qPM, and 0.1 mg qHS   Per mom, Judah has sustained improvements overall, but she did notice Lindalee crying and more irritable approx 10 days prior to menses. Mom began giving her Ibuprofen  during this time, and that seemed to provide some relief and stop her crying. Mom worries about the long-term effects of giving Ibuprofen  on a regular basis; advised that it is ok for short term use, especially when not used daily but to discuss with PCP or OB/GYN whether additional hormonal supplementation in the form of OCPs was a possibility prior to menses. Mom advised that she would discuss. Niaja has not resumed excoriations but has been hair pulling for the past two days. Mom wanted to see a dermatologist to determine if anything could be done and to rule out physiologic causes of her skin picking and hair pulling.  Additionally, Josely is sleeping well without nocturnal enuresis. She is tolerating her medications well. Kambryn has not been aggressive nor a danger to herself.   Of note, mom mentioned that Qiara has begun to say some words that she understands. Other times, Destanee tries to communicate, making sounds in the cadence of words, but comprehensible words are not made.    Visit Diagnosis:    ICD-10-CM   1. Autism  F84.0     2. Developmental disability  F89     3. Patchy loss of hair  L65.9      4. 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 09/2021-patient started on Prozac  10 mg, liquid form and Abilify  10 mg daily changed to 5 mg twice daily 10/30/2021-decrease Prozac  back to 10 mg (2.5 mL), continued Abilify  5 mg twice daily, continue trazodone  100 mg nightly, started patient on Cogentin  1 mg daily 01/15/2022: Increase clonidine  to 0.1 mg 3 times daily due to worsening skin picking behaviors, continue all other medications.  Patient was also no longer bedwetting with the addition of Cogentin  1 mg daily.  Patient no longer appeared hypomanic on this assessment, was no longer eating animal food or garbage. 02/15/2022: Mom concern for possible TD like movements with the mouth, decrease patient's Cogentin  to 0.5 mg daily also suggested trying a scarf as patient appears to like picking at her neck and hanging onto her shirts and [name] of her dresses.  Continue all other medications. 03/15/2022: Patient was extremely irritable and parents were endorsing aggression.  Patient was tearful and irritable during assessment and cannot be calmed down on previous assessments.  Patient was due for her IUD to be changed.  Mom was endorsing changes in behavior and patient endorsing pain that were reminiscent of symptoms of PMDD, discussed with mom increasing  patient's Prozac  to 20 mg in the week before she starts her period as this may help with some of the behavior changes.  Patient scheduled the IUD change in 04/2022-we will also get labs at this time. 04/2022-patient doing fairly well and no medication adjustments made.  Patient bedwetting did return with the discontinuation of Cogentin  however she is no longer having the lip smacking movements therefore it was not restarted.  Patient's behavior appeared to return  more similar to baseline after her IUD was placed. 06/2022- Bedwetting picking up, but mom not very concerned, some emesis noted in the AM but again mom not concerned because it appeared to be more related to allergies and had happened around the same time of year in the past. No med adjustments 08/2022- Noted more aggressive behaviors, mom significantly concerned. Clonidine  increased to 0.2 mg-0.1 mg-0.1 mg x 2 weeks, then may go to 0.2 mg-0.2 mg-0.1 mg.  09/2022- increased midday clonidine  dosage. Decreased Trazodone  to 75 mg at bedtime 11/2022- no bedwetting episodes, improvement in behaviors.   01/2023- Day program with safety accommodations. Sustained improvement in behaviors, no bedwetting episodes.    First visit: Patient used to be seen by Dr. Akintayo, was later transferred to Dr. Corean Geralds.  Patient was last seen by Dr. Corean Geralds (pediatric neurologist) 4/10 who endorsed that patient should return to psychiatric care due to complexity of presentation.   Previous medications include: Celexa  (1 week trial, mom reports patient appeared to have trouble sleeping and a flatter affect), Lexapro, Luvox (failed), Zoloft, Focalin (became more agitated and dangerous to self) ropinirole, BuSpar, gabapentin, Trileptal, Lamictal, clonidine , Vistaril, NAC, Valium (prior to procedures), Klonopin , Versed  (prior to procedures), Ativan , triazolam (prior to procedures)  Mom endorse some concerns that when patient has tried SSRIs in the past she appears to become more manic.  When asked for further details Mom reports that patient will become more restless, have outbursts of giggling and her sleep worsens.  Per both parents patient has always had significant trouble sleeping until she was started on trazodone .  Medications at initial visit: Trazodone  100 mg (mom feels is over sedating but prefers 75 mg), Abilify  10 mg (treating agitation, irritability and impulsivity fairly well) clonidine  2 mg daily,  NAC 2400 mg (per neurology for skin excoriation)  Past Medical History:  Past Medical History:  Diagnosis Date   Anemia    Anxiety    Autism disorder    per mother severe autism, very social,  good receptive languange but nonverbal (can do some sign languange) , high tolerence to pain   Dysmenorrhea    Menorrhagia    Nonverbal    PT AUTISTIC   PONV (postoperative nausea and vomiting)    per mother when wakes up pt can go from 0 to 60 and starts taking off cords and try to sit up to stand, better when mother and father are they when she wakes up   Repetitive movement    due to autism---  chronic picking of own skin and body    Past Surgical History:  Procedure Laterality Date   DENTAL RESTORATION/EXTRACTION WITH X-RAY     DILATION AND CURETTAGE OF UTERUS N/A 04/07/2017   Procedure: DILATATION AND CURETTAGE;  Surgeon: Cleotilde Ronal RAMAN, MD;  Location: [facility];  Service: Gynecology;  Laterality: N/A;   INTRAUTERINE DEVICE (IUD) INSERTION N/A 04/07/2017   Procedure: PLACEMENT OF KYLEENA   IUD;  Surgeon: Cleotilde Ronal RAMAN, MD;  Location: [facility];  Service: Gynecology;  Laterality: N/A;   INTRAUTERINE DEVICE (IUD) INSERTION N/A 04/23/2022   Procedure: INTRAUTERINE DEVICE (IUD) INSERTION;  Surgeon: Cleotilde Ronal RAMAN, MD;  Location: [facility];  Service: Gynecology;  Laterality: N/A;   IUD REMOVAL N/A 04/23/2022   Procedure: INTRAUTERINE DEVICE (IUD) REMOVAL;  Surgeon: Cleotilde Ronal RAMAN, MD;  Location: [facility];  Service: Gynecology;  Laterality: N/A;   MASS EXCISION Left 06/15/2015   Procedure: EXCISION OF LEFT AXILLARY MASS/TETANUS INJECTION;  Surgeon: Estefana RAMAN Fritter, DO;  Location: [facility];  Service: Plastics;  Laterality: Left;   OPERATIVE ULTRASOUND N/A 04/23/2022   Procedure: OPERATIVE ULTRASOUND;  Surgeon: Cleotilde Ronal RAMAN, MD;  Location: [facility];  Service: Gynecology;  Laterality: N/A;     Family Psychiatric History: Parents report some history of family members with depression    Family History:  Family History  Problem Relation Age of Onset   Heart disease Maternal Grandmother    Endometriosis Maternal Grandmother    Diabetes Maternal Grandfather    Cancer Maternal Grandfather        Skin Cancer   Asthma Mother    Hyperlipidemia Mother    Hypertension Paternal Grandfather    Hypertension Father    Hyperlipidemia Father    Diverticulosis Father    Arthritis Paternal Grandmother     Social History:  Social History   Socioeconomic History   Marital status: Single    Spouse name: Not on file   Number of children: Not on file   Years of education: Not on file   Highest education level: Not on file  Occupational History   Occupation: Medical Illustrator in [place]  Tobacco Use   Smoking status: Never   Smokeless tobacco: Never  Vaping Use   Vaping status: Never Used  Substance and Sexual Activity   Alcohol use: Never   Drug use: Never   Sexual activity: Never    Birth control/protection: None  Other Topics Concern   Not on file  Social History Narrative   Patient lives with both parents. Estefana graduated from [employer]. She is now at [employer]       Behavior specialist consult with mom once a month.    Therapies: Not at the moment.    Social Drivers of Corporate Investment Banker Strain: Not on file  Food Insecurity: Not on file  Transportation Needs: Not on file  Physical Activity: Not on file  Stress: Not on file  Social Connections: Not on file    Allergies:  Allergies  Allergen Reactions   Cleaner [Ao-Sept Disinfection-Neutral] Hives    BRAND NAME - KABOOM    Metabolic Disorder Labs: Lab Results  Component Value Date   HGBA1C 5.3 04/23/2022   MPG 105.41 04/23/2022   MPG 108.28 08/11/2019   Lab Results  Component Value Date   PROLACTIN 7.9 04/23/2022   Lab Results  Component Value Date   CHOL 181 04/23/2022    TRIG 68 04/23/2022   HDL 31 (L) 04/23/2022   CHOLHDL 5.8 04/23/2022   VLDL 14 04/23/2022   LDLCALC 136 (H) 04/23/2022   LDLCALC 102 (H) 08/11/2019   Lab Results  Component Value Date   TSH 2.564 04/23/2022   TSH 2.188 08/11/2019    Therapeutic Level Labs: No results found for: LITHIUM No results found for: VALPROATE No results found for: CBMZ  Current Medications: Current Outpatient Medications  Medication Sig Dispense Refill   Acetylcysteine  600  MG CAPS Take 2 capsules (1,200 mg total) by mouth 2 (two) times daily. 120 capsule 2   ARIPiprazole  (ABILIFY ) 5 MG tablet Take 1 tablet (5 mg total) by mouth 2 (two) times daily. 60 tablet 2   cloNIDine  (CATAPRES ) 0.1 MG tablet Take 1 tablet (0.1 mg total) by mouth every evening. To be taken at 1800 60 tablet 2   cloNIDine  (CATAPRES ) 0.2 MG tablet Take 1 tablet (0.2 mg total) by mouth 2 (two) times daily. To be taken each morning and at 1400 60 tablet 2   FLUoxetine  (PROZAC ) 20 MG/5ML solution Take 2.5 mLs (10 mg total) by mouth daily. Can take 5mL in the week before period, but decrease back to 2.5mL dose when period starts 120 mL 1   ibuprofen  (ADVIL ) 200 MG tablet Take 400 mg by mouth every 6 (six) hours as needed for fever, headache or mild pain.     Multiple Vitamin (MULTIVITAMIN) tablet Take 1 tablet by mouth daily.     mupirocin  ointment (BACTROBAN ) 2 % Apply 1 application topically 2 (two) times daily. (Patient taking differently: Apply 1 application  topically 2 (two) times daily as needed.) 22 g 0   traZODone  (DESYREL ) 50 MG tablet Take 1.5 tablets (75 mg total) by mouth at bedtime. 45 tablet 2   KYLEENA  19.5 MG IUD 1 Device by Intrauterine route once for 1 dose. 1 Intra Uterine Device 0   No current facility-administered medications for this visit.     Musculoskeletal: Strength & Muscle Tone: within normal limits Gait & Station: normal Patient leans: N/A  Psychiatric Specialty Exam: Review of Systems   Psychiatric/Behavioral:  Positive for behavioral problems.     There were no vitals taken for this visit.There is no height or weight on file to calculate BMI.  General Appearance:  Baseline, appropriately healing sore on L elbow none on neck, wearing overalls. Patchy hair (now growing back in some areas) once lifted but hair from the crown of her head covers it appropriately.   Eye Contact:  Fair  Speech:  NA  Volume:  Increased but baseline  Mood:   objectively irritable  Affect:  NA  Thought Process:  NA  Orientation:  NA  Thought Content: NA   Suicidal Thoughts:  No  Homicidal Thoughts:  No  Memory:  NA  Judgement:  Poor  Insight:  Lacking  Psychomotor Activity:  Normal/ increased , but baseline for patient  Concentration:  Concentration: Poor  Recall:  NA  Fund of Knowledge: Poor  Language: Poor; nonverbal  Akathisia:  No  Handed:    AIMS (if indicated): not done  Assets:  Housing Social Support  ADL's:  Impaired  Cognition: Impaired,  Severe  Sleep:  Good   Screenings: Flowsheet Row ED from 10/03/2020 in [facility])  C-SSRS RISK CATEGORY No Risk        Assessment and Plan: Mom endorses sustained improvements of aggressive, impulsive, and skin picking behaviors overall, with some increase in hair pulling over the past couple of days. Mom noted that Charlize began to experience some sx 10 days before menses, 2/2 patient crying and moaning, which were relieved with Ibuprofen . Advised mom to discuss estrogen OCP prior to menses with OB/GYN. Advised it is otherwise okay to continue short course of Ibuprofen . She is tolerating her medications well for mood and impulsivity. As well, she continues to do well with 75 mg of Trazodone  for sleep.   Patient's day program continues  using the seat belt buckle device for safety during transit.  No changes to be made today.   Autism Spectrum Disorder Skin picking excoriation  disorder Tics - Continue Abilify  5 mg twice daily - Continue trazodone  75 mg nightly - Continue Prozac  10 mg (liquid form), for skin picking excoriation and other repetitive behaviors.  As Ibuprofen  has been beneficial, will not increase standing dose at this time. Mom also not giving pulsed dosing.  - Continue clonidine  0.2mg  QAM /0.2 mg at 2pm/ 0.1 mg at bedtime. - Continue N-acetylcysteine  1200 mg twice daily - F/U in 8-10 weeks   Hx akathisia, etiology unknown -Abnormal lip movements (concern for TD) when on Cogentin , resolved when Cogentin  discontinued (04/2022)   Collaboration of Care: Collaboration of Care: Dr. Barbra  Patient/Guardian was advised Release of Information must be obtained prior to any record release in order to collaborate their care with an outside provider. Patient/Guardian was advised if they have not already done so to contact the registration department to sign all necessary forms in order for us  to release information regarding their care.   Consent: Patient/Guardian gives verbal consent for treatment and assignment of benefits for services provided during this visit. Patient/Guardian expressed understanding and agreed to proceed.   PGY-3 Charmaine Myrtle, MD 03/20/2023 8:35 AM

## 2023-03-20 NOTE — Progress Notes (Signed)
 Pt's mother came to office for another reason but wanted to discuss possibly putting pt on OCPs.  Pt has autism.  Pt's mother is having to give pt ibuprofen  more regularly.  Pt is non verbal.  Unsure if due to menstrual cramping.  Pt has IUD with good bleeding control.  They saw pt's psychiatrist yesterday and she suggested a trial of OCPs and was wondering if I felt comfortable prescribing.  Risks reviewed including DVT/PE.  Rx to pharmacy for 90 day supply.  Pt's mother will give me update in 3 - 4 months.

## 2023-03-20 NOTE — Addendum Note (Signed)
 Addended by: Tia Masker on: 03/20/2023 01:18 PM   Modules accepted: Level of Service

## 2023-04-29 ENCOUNTER — Other Ambulatory Visit (HOSPITAL_COMMUNITY): Payer: Self-pay | Admitting: Psychiatry

## 2023-04-29 DIAGNOSIS — F840 Autistic disorder: Secondary | ICD-10-CM

## 2023-05-15 ENCOUNTER — Ambulatory Visit (INDEPENDENT_AMBULATORY_CARE_PROVIDER_SITE_OTHER): Payer: MEDICAID | Admitting: Student

## 2023-05-15 DIAGNOSIS — L659 Nonscarring hair loss, unspecified: Secondary | ICD-10-CM

## 2023-05-15 DIAGNOSIS — F424 Excoriation (skin-picking) disorder: Secondary | ICD-10-CM | POA: Diagnosis not present

## 2023-05-15 DIAGNOSIS — Z79899 Other long term (current) drug therapy: Secondary | ICD-10-CM

## 2023-05-15 DIAGNOSIS — F89 Unspecified disorder of psychological development: Secondary | ICD-10-CM | POA: Diagnosis not present

## 2023-05-15 DIAGNOSIS — F840 Autistic disorder: Secondary | ICD-10-CM | POA: Diagnosis not present

## 2023-05-15 DIAGNOSIS — F984 Stereotyped movement disorders: Secondary | ICD-10-CM

## 2023-05-15 DIAGNOSIS — G2571 Drug induced akathisia: Secondary | ICD-10-CM

## 2023-05-15 MED ORDER — FLUOXETINE HCL 20 MG/5ML PO SOLN: 10.0000 mg | Freq: Every day | ORAL | 1 refills | Status: DC

## 2023-05-15 MED ORDER — ARIPIPRAZOLE 5 MG PO TABS: 5.0000 mg | ORAL_TABLET | Freq: Two times a day (BID) | ORAL | 2 refills | Status: DC

## 2023-05-15 MED ORDER — TRAZODONE HCL 50 MG PO TABS: 75.0000 mg | ORAL_TABLET | Freq: Every day | ORAL | 2 refills | Status: DC

## 2023-05-15 MED ORDER — CLONIDINE HCL 0.2 MG PO TABS: 0.2000 mg | ORAL_TABLET | Freq: Two times a day (BID) | ORAL | 2 refills | Status: DC

## 2023-05-15 MED ORDER — FLUOXETINE HCL 20 MG/5ML PO SOLN
10.0000 mg | Freq: Every day | ORAL | 1 refills | Status: DC
Start: 2023-05-15 — End: 2023-06-13

## 2023-05-15 MED ORDER — CLONIDINE HCL 0.1 MG PO TABS: 0.1000 mg | ORAL_TABLET | Freq: Every evening | ORAL | 2 refills | Status: DC

## 2023-05-15 MED ORDER — ACETYLCYSTEINE 600 MG PO CAPS: 1200.0000 mg | ORAL_CAPSULE | Freq: Two times a day (BID) | ORAL | 2 refills | Status: DC

## 2023-05-15 NOTE — Progress Notes (Signed)
 BH MD/PA/NP OP Progress Note  05/15/2023  8:35 AM  [name]  MRN:  [id number]  Chief Complaint:  Chief Complaint  Patient presents with   Follow-up   Medication Refill   HPI: Gabrielle [name] is a 23 year old patient with a history of non-verbal autism, skin excoriation disorder, trichotillomania, tic disorder, developmental delay and akathisia of unknown etiology. Patient presents today with her mother.    Patient is compliant with the following medication regimen: Abilify 5 mg twice daily NAC 1200 mg twice daily Trazodone 75 mg nightly Prozac 10 mg (liquid solution)  Clonidine 0.2 mg qAM, 0.2 mg qPM, and 0.1 mg qHS   Per mom, Gabrielle [name] has sustained improvements overall, but she did notice Gabrielle [name] has developed a new sound similar to yodeling.  She is unable to determine the reason for this sound.  It began around the time that patient started hormonal therapy.  Mom does note that patient has not shown signs of aggression since starting hormonal therapy.  Mom does have concerns about only taking the active hormone without placebo pills, but she is advised to continue medications as prescribed to see how patient does with it, to which she is agreeable.  Mom has not had to give ibuprofen, but did have to give Pamperin x 2 days for severe menstrual pain.  Gabrielle [name] has resumed excoriations and hair pulling for the past two weeks.  Discussed with mom looking into options for occupational therapy for habit reversal and sensory integration training.  Gabrielle [name] is sleeping well but has had a couple of nights with nocturnal enuresis. She is otherwise tolerating her medications well. Gabrielle [name] has not been a danger to herself.    Visit Diagnosis:    ICD-10-CM   1. Autism  F84.0 Acetylcysteine 600 MG CAPS    ARIPiprazole (ABILIFY) 5 MG tablet    cloNIDine (CATAPRES) 0.2 MG tablet    cloNIDine (CATAPRES) 0.1 MG tablet    FLUoxetine (PROZAC) 20 MG/5ML solution    traZODone (DESYREL) 50 MG tablet    2.  Developmental disability  F89     3. Patchy loss of hair  L65.9     4. 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 Long term current use of antipsychotic medication  Z79.899     7. Akathisia  G25.71            Past Psychiatric History: 10/02/2021-patient was not having excoriating behaviors, medications were continued. 09/2021-patient started on Prozac 10 mg, liquid form and Abilify 10 mg daily changed to 5 mg twice daily 10/30/2021-decrease Prozac back to 10 mg (2.5 mL), continued Abilify 5 mg twice daily, continue trazodone 100 mg nightly, started patient on Cogentin 1 mg daily 01/15/2022: Increase clonidine to 0.1 mg 3 times daily due to worsening skin picking behaviors, continue all other medications.  Patient was also no longer bedwetting with the addition of Cogentin 1 mg daily.  Patient no longer appeared hypomanic on this assessment, was no longer eating animal food or garbage. 02/15/2022: Mom concern for possible TD like movements with the mouth, decrease patient's Cogentin to 0.5 mg daily also suggested trying a scarf as patient appears to like picking at her neck and hanging onto her shirts and [name] of her dresses.  Continue all other medications. 03/15/2022: Patient was extremely irritable and parents were endorsing aggression.  Patient was tearful and irritable during assessment and cannot be calmed down on previous assessments.  Patient was due for her IUD to be changed.  Mom was endorsing changes in behavior and patient endorsing pain that were reminiscent of symptoms of PMDD, discussed with mom increasing patient's Prozac to 20 mg in the week before she starts her period as this may help with some of the behavior changes.  Patient scheduled the IUD change in 04/2022-we will also get labs at this time. 04/2022-patient doing fairly well and no medication adjustments made.  Patient bedwetting did return with the discontinuation of Cogentin  however she is no longer having the lip smacking movements therefore it was not restarted.  Patient's behavior appeared to return more similar to baseline after her IUD was placed. 06/2022- Bedwetting picking up, but mom not very concerned, some emesis noted in the AM but again mom not concerned because it appeared to be more related to allergies and had happened around the same time of year in the past. No med adjustments 08/2022- Noted more aggressive behaviors, mom significantly concerned. Clonidine increased to 0.2 mg-0.1 mg-0.1 mg x 2 weeks, then may go to 0.2 mg-0.2 mg-0.1 mg.  09/2022- increased midday clonidine dosage. Decreased Trazodone to 75 mg at bedtime 11/2022- no bedwetting episodes, improvement in behaviors.   01/2023- Day program with safety accommodations. Sustained improvement in behaviors, no bedwetting episodes.    First visit: Patient used to be seen by Dr. Jannifer Franklin, was later transferred to Dr. Lorenz Coaster.  Patient was last seen by Dr. Lorenz Coaster (pediatric neurologist) 4/10 who endorsed that patient should return to psychiatric care due to complexity of presentation.   Previous medications include: Celexa (1 week trial, mom reports patient appeared to have trouble sleeping and a flatter affect), Lexapro, Luvox (failed), Zoloft, Focalin (became more agitated and dangerous to self) ropinirole, BuSpar, gabapentin, Trileptal, Lamictal, clonidine, Vistaril, NAC, Valium (prior to procedures), Klonopin, Versed (prior to procedures), Ativan, triazolam (prior to procedures)  Mom endorse some concerns that when patient has tried SSRIs in the past she appears to become more "manic."  When asked for further details Mom reports that patient will become more restless, have outbursts of giggling and her sleep worsens.  Per both parents patient has always had significant trouble sleeping until she was started on trazodone.  Medications at initial visit: Trazodone 100 mg (mom feels is over  sedating but prefers 75 mg), Abilify 10 mg (treating agitation, irritability and impulsivity fairly well) clonidine 2 mg daily, NAC 2400 mg (per neurology for skin excoriation)  Past Medical History:  Past Medical History:  Diagnosis Date   Anemia    Anxiety    Autism disorder    per mother severe autism, very social,  good receptive languange but nonverbal (can do some sign languange) , high tolerence to pain   Dysmenorrhea    Menorrhagia    Nonverbal    PT AUTISTIC   PONV (postoperative nausea and vomiting)    per mother "when wakes up pt can go from 0 to 60 and starts taking off cords and try to sit up to stand, better when mother and father are they when she wakes up"   Repetitive movement    due to autism---  chronic picking of own skin and body    Past Surgical History:  Procedure Laterality Date   DENTAL RESTORATION/EXTRACTION WITH X-RAY     DILATION AND CURETTAGE OF UTERUS N/A 04/07/2017   Procedure: DILATATION AND CURETTAGE;  Surgeon: Jerene Bears, MD;  Location: [facility];  Service: Gynecology;  Laterality: N/A;   INTRAUTERINE DEVICE (IUD) INSERTION N/A 04/07/2017   Procedure: PLACEMENT OF Rutha Bouchard  IUD;  Surgeon: Jerene Bears, MD;  Location: [facility];  Service: Gynecology;  Laterality: N/A;   INTRAUTERINE DEVICE (IUD) INSERTION N/A 04/23/2022   Procedure: INTRAUTERINE DEVICE (IUD) INSERTION;  Surgeon: Jerene Bears, MD;  Location[facility];  Service: Gynecology;  Laterality: N/A;   IUD REMOVAL N/A 04/23/2022   Procedure: INTRAUTERINE DEVICE (IUD) REMOVAL;  Surgeon: Jerene Bears, MD;  Location: [facility];  Service: Gynecology;  Laterality: N/A;   MASS EXCISION Left 06/15/2015   Procedure: EXCISION OF LEFT AXILLARY MASS/TETANUS INJECTION;  Surgeon: Peggye Form, DO;  Location: [facility];  Service: Plastics;  Laterality: Left;   OPERATIVE ULTRASOUND N/A 04/23/2022   Procedure: OPERATIVE  ULTRASOUND;  Surgeon: Jerene Bears, MD;  Location: [facility];  Service: Gynecology;  Laterality: N/A;    Family Psychiatric History: Parents report some history of family members with depression    Family History:  Family History  Problem Relation Age of Onset   Heart disease Maternal Grandmother    Endometriosis Maternal Grandmother    Diabetes Maternal Grandfather    Cancer Maternal Grandfather        Skin Cancer   Asthma Mother    Hyperlipidemia Mother    Hypertension Paternal Grandfather    Hypertension Father    Hyperlipidemia Father    Diverticulosis Father    Arthritis Paternal Grandmother     Social History:  Social History   Socioeconomic History   Marital status: Single    Spouse name: Not on file   Number of children: Not on file   Years of education: Not on file   Highest education level: Not on file  Occupational History   Occupation: Medical illustrator in [place]  Tobacco Use   Smoking status: Never   Smokeless tobacco: Never  Vaping Use   Vaping status: Never Used  Substance and Sexual Activity   Alcohol use: Never   Drug use: Never   Sexual activity: Never    Birth control/protection: None  Other Topics Concern   Not on file  Social History Narrative   Patient lives with both parents. [name] graduated from [employer]. She is now at [employer]       Behavior specialist consult with mom once a month.    Therapies: Not at the moment.    Social Drivers of Corporate investment banker Strain: Not on file  Food Insecurity: Not on file  Transportation Needs: Not on file  Physical Activity: Not on file  Stress: Not on file  Social Connections: Not on file    Allergies:  Allergies  Allergen Reactions   Cleaner [Ao-Sept Disinfection-Neutral] Hives    BRAND NAME - KABOOM    Metabolic Disorder Labs: Lab Results  Component Value Date   HGBA1C 5.3 04/23/2022   MPG 105.41 04/23/2022   MPG 108.28 08/11/2019   Lab  Results  Component Value Date   PROLACTIN 7.9 04/23/2022   Lab Results  Component Value Date   CHOL 181 04/23/2022   TRIG 68 04/23/2022   HDL 31 (L) 04/23/2022   CHOLHDL 5.8 04/23/2022   VLDL 14 04/23/2022   LDLCALC 136 (H) 04/23/2022   LDLCALC 102 (H) 08/11/2019   Lab Results  Component Value Date   TSH 2.564 04/23/2022   TSH 2.188 08/11/2019    Therapeutic Level  Labs: No results found for: "LITHIUM" No results found for: "VALPROATE" No results found for: "CBMZ"  Current Medications: Current Outpatient Medications  Medication Sig Dispense Refill   Acetylcysteine 600 MG CAPS Take 2 capsules (1,200 mg total) by mouth 2 (two) times daily. 120 capsule 2   ARIPiprazole (ABILIFY) 5 MG tablet Take 1 tablet (5 mg total) by mouth 2 (two) times daily. 60 tablet 2   cloNIDine (CATAPRES) 0.1 MG tablet Take 1 tablet (0.1 mg total) by mouth every evening. To be taken at 1800 60 tablet 2   cloNIDine (CATAPRES) 0.2 MG tablet Take 1 tablet (0.2 mg total) by mouth 2 (two) times daily. To be taken each morning and at 1400 60 tablet 2   FLUoxetine (PROZAC) 20 MG/5ML solution Take 2.5 mLs (10 mg total) by mouth daily. Can take 5mL in the week before period, but decrease back to 2.48mL dose when period starts 120 mL 1   ibuprofen (ADVIL) 200 MG tablet Take 400 mg by mouth every 6 (six) hours as needed for fever, headache or mild pain.     KYLEENA 19.5 MG IUD 1 Device by Intrauterine route once for 1 dose. 1 Intra Uterine Device 0   Multiple Vitamin (MULTIVITAMIN) tablet Take 1 tablet by mouth daily.     mupirocin ointment (BACTROBAN) 2 % Apply 1 application topically 2 (two) times daily. (Patient taking differently: Apply 1 application  topically 2 (two) times daily as needed.) 22 g 0   norethindrone-ethinyl estradiol-FE (LOESTRIN FE) 1-20 MG-MCG tablet Take 1 tablet by mouth daily. Skip placebo pills and take continuous active. 112 tablet 1   traZODone (DESYREL) 50 MG tablet Take 1.5 tablets (75 mg  total) by mouth at bedtime. 45 tablet 2   No current facility-administered medications for this visit.     Musculoskeletal: Strength & Muscle Tone: within normal limits Gait & Station: normal Patient leans: N/A  Psychiatric Specialty Exam: Review of Systems  Psychiatric/Behavioral:  Positive for behavioral problems.     There were no vitals taken for this visit.There is no height or weight on file to calculate BMI.  General Appearance:  Baseline, appropriately healing sore on L elbow none on neck, wearing overalls. Patchy hair (now growing back in some areas) once lifted but hair from the crown of her head covers it appropriately.   Eye Contact:  Fair  Speech:  NA  Volume:  Increased but baseline  Mood:   objectively euthymic  Affect:  NA  Thought Process:  NA  Orientation:  NA  Thought Content: NA   Suicidal Thoughts:  No  Homicidal Thoughts:  No  Memory:  NA  Judgement:  Poor  Insight:  Lacking  Psychomotor Activity:  Normal/ increased , but baseline for patient  Concentration:  Concentration: Poor  Recall:  NA  Fund of Knowledge: Poor  Language: Poor; nonverbal  Akathisia:  No  Handed:    AIMS (if indicated): not done  Assets:  Housing Social Support  ADL's:  Impaired  Cognition: Impaired,  Severe  Sleep:  Good   Screenings: Flowsheet Row ED from 10/03/2020 in [facility])  C-SSRS RISK CATEGORY No Risk        Assessment and Plan: Mom endorses sustained improvements of aggressive, impulsive behaviors overall, but patient has had an significant increase in hair pulling and excoriations over the past couple of weeks. Mom noted that Maaliyah only had symptoms consistent with severe pain 2 days during menses, but  has otherwise been doing well since starting hormonal therapy.  Patient has begun a sound similar to a yodel, and mom is unsure of the etiology.  Advised mom to assess for signs of sore throat, to which she agreed.  She  is tolerating her medications well for mood and impulsivity. As well, she continues to do well with 75 mg of Trazodone for sleep.   No changes to be made today to medications.  This Clinical research associate will look into options for occupational therapy.  Autism Spectrum Disorder Skin picking excoriation disorder Tics - Continue Abilify 5 mg twice daily - Continue trazodone 75 mg nightly - Continue Prozac 10 mg (liquid form), for skin picking excoriation and other repetitive behaviors.   - Continue clonidine 0.2mg  QAM /0.2 mg at 2pm/ 0.1 mg at bedtime. - Continue N-acetylcysteine 1200 mg twice daily - F/U in approximately 6 weeks   Hx akathisia, etiology unknown -Abnormal lip movements (concern for TD) when on Cogentin, resolved when Cogentin discontinued (04/2022)   Collaboration of Care: Collaboration of Care: Dr. Josephina Shih  Patient/Guardian was advised Release of Information must be obtained prior to any record release in order to collaborate their care with an outside provider. Patient/Guardian was advised if they have not already done so to contact the registration department to sign all necessary forms in order for [place] to release information regarding their care.   Consent: Patient/Guardian gives verbal consent for treatment and assignment of benefits for services provided during this visit. Patient/Guardian expressed understanding and agreed to proceed.   PGY-3 Lamar Sprinkles, MD 05/15/2023 8:35 AM

## 2023-06-13 ENCOUNTER — Ambulatory Visit (INDEPENDENT_AMBULATORY_CARE_PROVIDER_SITE_OTHER): Payer: MEDICAID | Admitting: Student

## 2023-06-13 DIAGNOSIS — F89 Unspecified disorder of psychological development: Secondary | ICD-10-CM | POA: Diagnosis not present

## 2023-06-13 DIAGNOSIS — F840 Autistic disorder: Secondary | ICD-10-CM

## 2023-06-13 DIAGNOSIS — L659 Nonscarring hair loss, unspecified: Secondary | ICD-10-CM

## 2023-06-13 DIAGNOSIS — G2571 Drug induced akathisia: Secondary | ICD-10-CM

## 2023-06-13 DIAGNOSIS — F424 Excoriation (skin-picking) disorder: Secondary | ICD-10-CM | POA: Diagnosis not present

## 2023-06-13 DIAGNOSIS — G2569 Other tics of organic origin: Secondary | ICD-10-CM

## 2023-06-13 DIAGNOSIS — Z79899 Other long term (current) drug therapy: Secondary | ICD-10-CM

## 2023-06-13 DIAGNOSIS — F984 Stereotyped movement disorders: Secondary | ICD-10-CM

## 2023-06-13 MED ORDER — ACETYLCYSTEINE 600 MG PO CAPS: 1200.0000 mg | ORAL_CAPSULE | Freq: Two times a day (BID) | ORAL | 2 refills | Status: DC

## 2023-06-13 MED ORDER — CLONIDINE HCL 0.1 MG PO TABS: 0.1000 mg | ORAL_TABLET | Freq: Every evening | ORAL | 2 refills | Status: DC

## 2023-06-13 MED ORDER — ARIPIPRAZOLE 5 MG PO TABS: 5.0000 mg | ORAL_TABLET | Freq: Two times a day (BID) | ORAL | 2 refills | Status: DC

## 2023-06-13 MED ORDER — FLUOXETINE HCL 20 MG/5ML PO SOLN: 10.0000 mg | Freq: Every day | ORAL | 1 refills | Status: DC

## 2023-06-13 MED ORDER — CLONIDINE HCL 0.2 MG PO TABS: 0.2000 mg | ORAL_TABLET | Freq: Two times a day (BID) | ORAL | 2 refills | Status: DC

## 2023-06-13 MED ORDER — TRAZODONE HCL 50 MG PO TABS: 75.0000 mg | ORAL_TABLET | Freq: Every day | ORAL | 2 refills | Status: DC

## 2023-06-13 NOTE — Progress Notes (Signed)
 BH MD/PA/NP OP Progress Note  06/13/2023  8:35 AM  NOA GALVAO  MRN:  [id number]  Chief Complaint:  Chief Complaint  Patient presents with   Follow-up   Medication Refill   HPI: Gabrielle [name] is a 23 year old patient with a history of non-verbal autism, skin excoriation disorder, trichotillomania, tic disorder, developmental delay and akathisia of unknown etiology. Patient presents today with her mother.    Patient is compliant with the following medication regimen: Abilify 5 mg twice daily NAC 1200 mg twice daily Trazodone 75 mg nightly Prozac 10 mg (liquid solution)  Clonidine 0.2 mg qAM, 0.2 mg qPM, and 0.1 mg qHS   Per mom, Taran had significant irritability and return of aggression while taking estrogen birth control.  When the medication was stopped by mom, patient's mood restored to normal.  She has also noted that the [place] sound that patient developed was assigned of agitation, as this is also improved with discontinuation of birth control.  Maritssa has had no excoriations or hair pulling for the past two weeks, which we have also noted occurs on a cyclical basis.  Patient had follow up for dentistry which revealed no significant concerns about her dentition.  Discussed with mom some options for habit reversal and sensory integration training.  We will continue to try to find other more OT and autism specific options.  Shawanna is sleeping well and has had rare occurrences of nocturnal enuresis. She is tolerating her medications well. Brynley has not been a danger to herself.   Mom reports that in her time of aggression, Loisann was able to wiggle her way out of the seatbelt buckle, which caused some safety concerns with her day program.  Mom now has to transport patient to and from her day program.  Mom is working with the program on a more restrictive seatbelt option, which they should be able to implement next week.  Mom also plans on applying for disability for [place].   Visit  Diagnosis:    ICD-10-CM   1. Autism  F84.0     2. Developmental disability  F89     3. Patchy loss of hair  L65.9     4. 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 Long term current use of antipsychotic medication  Z79.899     7. Akathisia  G25.71     8. 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 09/2021-patient started on Prozac 10 mg, liquid form and Abilify 10 mg daily changed to 5 mg twice daily 10/30/2021-decrease Prozac back to 10 mg (2.5 mL), continued Abilify 5 mg twice daily, continue trazodone 100 mg nightly, started patient on Cogentin 1 mg daily 01/15/2022: Increase clonidine to 0.1 mg 3 times daily due to worsening skin picking behaviors, continue all other medications.  Patient was also no longer bedwetting with the addition of Cogentin 1 mg daily.  Patient no longer appeared hypomanic on this assessment, was no longer eating animal food or garbage. 02/15/2022: Mom concern for possible TD like movements with the mouth, decrease patient's Cogentin to 0.5 mg daily also suggested trying a scarf as patient appears to like picking at her neck and hanging onto her shirts and [name] of her dresses.  Continue all other medications. 03/15/2022: Patient was extremely irritable and parents were endorsing  aggression.  Patient was tearful and irritable during assessment and cannot be calmed down on previous assessments.  Patient was due for her IUD to be changed.  Mom was endorsing changes in behavior and patient endorsing pain that were reminiscent of symptoms of PMDD, discussed with mom increasing patient's Prozac to 20 mg in the week before she starts her period as this may help with some of the behavior changes.  Patient scheduled the IUD change in 04/2022-we will also get labs at this time. 04/2022-patient doing fairly well and no  medication adjustments made.  Patient bedwetting did return with the discontinuation of Cogentin however she is no longer having the lip smacking movements therefore it was not restarted.  Patient's behavior appeared to return more similar to baseline after her IUD was placed. 06/2022- Bedwetting picking up, but mom not very concerned, some emesis noted in the AM but again mom not concerned because it appeared to be more related to allergies and had happened around the same time of year in the past. No med adjustments 08/2022- Noted more aggressive behaviors, mom significantly concerned. Clonidine increased to 0.2 mg-0.1 mg-0.1 mg x 2 weeks, then may go to 0.2 mg-0.2 mg-0.1 mg.  09/2022- increased midday clonidine dosage. Decreased Trazodone to 75 mg at bedtime 11/2022- no bedwetting episodes, improvement in behaviors.   01/2023- Day program with safety accommodations. Sustained improvement in behaviors, no bedwetting episodes.    First visit: Patient used to be seen by Dr. Jannifer Franklin, was later transferred to Dr. Lorenz Coaster.  Patient was last seen by Dr. Lorenz Coaster (pediatric neurologist) 4/10 who endorsed that patient should return to psychiatric care due to complexity of presentation.   Previous medications include: Celexa (1 week trial, mom reports patient appeared to have trouble sleeping and a flatter affect), Lexapro, Luvox (failed), Zoloft, Focalin (became more agitated and dangerous to self) ropinirole, BuSpar, gabapentin, Trileptal, Lamictal, clonidine, Vistaril, NAC, Valium (prior to procedures), Klonopin, Versed (prior to procedures), Ativan, triazolam (prior to procedures)  Mom endorse some concerns that when patient has tried SSRIs in the past she appears to become more "manic."  When asked for further details Mom reports that patient will become more restless, have outbursts of giggling and her sleep worsens.  Per both parents patient has always had significant trouble sleeping until  she was started on trazodone.  Medications at initial visit: Trazodone 100 mg (mom feels is over sedating but prefers 75 mg), Abilify 10 mg (treating agitation, irritability and impulsivity fairly well) clonidine 2 mg daily, NAC 2400 mg (per neurology for skin excoriation)  Past Medical History:  Past Medical History:  Diagnosis Date   Anemia    Anxiety    Autism disorder    per mother severe autism, very social,  good receptive languange but nonverbal (can do some sign languange) , high tolerence to pain   Dysmenorrhea    Menorrhagia    Nonverbal    PT AUTISTIC   PONV (postoperative nausea and vomiting)    per mother "when wakes up pt can go from 0 to 60 and starts taking off cords and try to sit up to stand, better when mother and father are they when she wakes up"   Repetitive movement    due to autism---  chronic picking of own skin and body    Past Surgical History:  Procedure Laterality Date   DENTAL RESTORATION/EXTRACTION WITH X-RAY     DILATION AND CURETTAGE OF UTERUS N/A 04/07/2017   Procedure: DILATATION  AND CURETTAGE;  Surgeon: Jerene Bears, MD;  Location: [facility];  Service: Gynecology;  Laterality: N/A;   INTRAUTERINE DEVICE (IUD) INSERTION N/A 04/07/2017   Procedure: PLACEMENT OF Rutha Bouchard  IUD;  Surgeon: Jerene Bears, MD;  Location[facility];  Service: Gynecology;  Laterality: N/A;   INTRAUTERINE DEVICE (IUD) INSERTION N/A 04/23/2022   Procedure: INTRAUTERINE DEVICE (IUD) INSERTION;  Surgeon: Jerene Bears, MD;  Location: [facility];  Service: Gynecology;  Laterality: N/A;   IUD REMOVAL N/A 04/23/2022   Procedure: INTRAUTERINE DEVICE (IUD) REMOVAL;  Surgeon: Jerene Bears, MD;  Location: [facility];  Service: Gynecology;  Laterality: N/A;   MASS EXCISION Left 06/15/2015   Procedure: EXCISION OF LEFT AXILLARY MASS/TETANUS INJECTION;  Surgeon: Peggye Form, DO;  Location: [facility];   Service: Plastics;  Laterality: Left;   OPERATIVE ULTRASOUND N/A 04/23/2022   Procedure: OPERATIVE ULTRASOUND;  Surgeon: Jerene Bears, MD;  Location: [facility];  Service: Gynecology;  Laterality: N/A;    Family Psychiatric History: Parents report some history of family members with depression    Family History:  Family History  Problem Relation Age of Onset   Heart disease Maternal Grandmother    Endometriosis Maternal Grandmother    Diabetes Maternal Grandfather    Cancer Maternal Grandfather        Skin Cancer   Asthma Mother    Hyperlipidemia Mother    Hypertension Paternal Grandfather    Hypertension Father    Hyperlipidemia Father    Diverticulosis Father    Arthritis Paternal Grandmother     Social History:  Social History   Socioeconomic History   Marital status: Single    Spouse name: Not on file   Number of children: Not on file   Years of education: Not on file   Highest education level: Not on file  Occupational History   Occupation: Medical illustrator in [place]  Tobacco Use   Smoking status: Never   Smokeless tobacco: Never  Vaping Use   Vaping status: Never Used  Substance and Sexual Activity   Alcohol use: Never   Drug use: Never   Sexual activity: Never    Birth control/protection: None  Other Topics Concern   Not on file  Social History Narrative   Patient lives with both parents. [name] graduated from [employer]. She is now at [employer]       Behavior specialist consult with mom once a month.    Therapies: Not at the moment.    Social Drivers of Corporate investment banker Strain: Not on file  Food Insecurity: Not on file  Transportation Needs: Not on file  Physical Activity: Not on file  Stress: Not on file  Social Connections: Not on file    Allergies:  Allergies  Allergen Reactions   Cleaner [Ao-Sept Disinfection-Neutral] Hives    BRAND NAME - KABOOM    Metabolic Disorder Labs: Lab Results   Component Value Date   HGBA1C 5.3 04/23/2022   MPG 105.41 04/23/2022   MPG 108.28 08/11/2019   Lab Results  Component Value Date   PROLACTIN 7.9 04/23/2022   Lab Results  Component Value Date   CHOL 181 04/23/2022   TRIG 68 04/23/2022   HDL 31 (L) 04/23/2022   CHOLHDL 5.8 04/23/2022   VLDL 14 04/23/2022   LDLCALC 136 (H) 04/23/2022   LDLCALC 102 (H) 08/11/2019   Lab Results  Component Value Date   TSH 2.564 04/23/2022   TSH 2.188 08/11/2019    Therapeutic Level Labs: No results found for: "LITHIUM" No results found for: "VALPROATE" No results found for: "CBMZ"  Current Medications: Current Outpatient Medications  Medication Sig Dispense Refill   Acetylcysteine 600 MG CAPS Take 2 capsules (1,200 mg total) by mouth 2 (two) times daily. 120 capsule 2   ARIPiprazole (ABILIFY) 5 MG tablet Take 1 tablet (5 mg total) by mouth 2 (two) times daily. 60 tablet 2   cloNIDine (CATAPRES) 0.1 MG tablet Take 1 tablet (0.1 mg total) by mouth every evening. To be taken at 1800 60 tablet 2   cloNIDine (CATAPRES) 0.2 MG tablet Take 1 tablet (0.2 mg total) by mouth 2 (two) times daily. To be taken each morning and at 1400 60 tablet 2   FLUoxetine (PROZAC) 20 MG/5ML solution Take 2.5 mLs (10 mg total) by mouth daily. 120 mL 1   ibuprofen (ADVIL) 200 MG tablet Take 400 mg by mouth every 6 (six) hours as needed for fever, headache or mild pain.     KYLEENA 19.5 MG IUD 1 Device by Intrauterine route once for 1 dose. 1 Intra Uterine Device 0   Multiple Vitamin (MULTIVITAMIN) tablet Take 1 tablet by mouth daily.     mupirocin ointment (BACTROBAN) 2 % Apply 1 application topically 2 (two) times daily. (Patient taking differently: Apply 1 application  topically 2 (two) times daily as needed.) 22 g 0   norethindrone-ethinyl estradiol-FE (LOESTRIN FE) 1-20 MG-MCG tablet Take 1 tablet by mouth daily. Skip placebo pills and take continuous active. 112 tablet 1   traZODone (DESYREL) 50 MG tablet Take 1.5  tablets (75 mg total) by mouth at bedtime. 45 tablet 2   No current facility-administered medications for this visit.     Musculoskeletal: Strength & Muscle Tone: within normal limits Gait & Station: normal Patient leans: N/A  Psychiatric Specialty Exam: Review of Systems  Psychiatric/Behavioral:  Positive for behavioral problems.     There were no vitals taken for this visit.There is no height or weight on file to calculate BMI.  General Appearance:  Baseline, appropriately healing sore on L elbow none on neck, wearing overalls. Patchy hair (now growing back in some areas) once lifted but hair from the crown of her head covers it appropriately.   Eye Contact:  Fair, improved  Speech:  NA  Volume:  Increased but baseline  Mood:   objectively euthymic  Affect:  NA  Thought Process:  NA  Orientation:  NA  Thought Content: NA   Suicidal Thoughts:  No  Homicidal Thoughts:  No  Memory:  NA  Judgement:  Poor  Insight:  Lacking  Psychomotor Activity:  Normal/ increased , but baseline for patient  Concentration:  Concentration: Poor  Recall:  NA  Fund of Knowledge: Poor  Language: Poor; nonverbal  Akathisia:  No  Handed:    AIMS (if indicated): not done  Assets:  Housing Social Support  ADL's:  Impaired  Cognition: Impaired,  Severe  Sleep:  Good   Screenings: Flowsheet Row ED from 10/03/2020 in [facility])  C-SSRS RISK CATEGORY No Risk        Assessment and Plan: Mom endorses improvements of aggressive, impulsive behaviors (that returned with OCP) after discontinuing the OCP.  Patient has also had a decrease in hair pulling and excoriations over the past couple of weeks.  While the OCP was beneficial for her  physical symptoms, it did not seem to do well for her mood, aggression, no irritability.  She is tolerating her medications well for mood, impulsivity, and sleep.  No changes to be made today to medications.  This Clinical research associate  will look into other options for occupational therapy.  Mom provided with resources for habit reversal therapy.  Autism Spectrum Disorder Skin picking excoriation disorder Tics - Continue Abilify 5 mg twice daily - Continue trazodone 75 mg nightly - Continue Prozac 10 mg (liquid form), for skin picking excoriation and other repetitive behaviors.   - Continue clonidine 0.2mg  QAM /0.2 mg at 2pm/ 0.1 mg at bedtime. - Continue N-acetylcysteine 1200 mg twice daily - F/U in approximately 6 weeks   Hx akathisia, etiology unknown -Abnormal lip movements (concern for TD) when on Cogentin, resolved when Cogentin discontinued (04/2022)   Collaboration of Care: Collaboration of Care: Dr. Woodroe Mode  Patient/Guardian was advised Release of Information must be obtained prior to any record release in order to collaborate their care with an outside provider. Patient/Guardian was advised if they have not already done so to contact the registration department to sign all necessary forms in order for [place] to release information regarding their care.   Consent: Patient/Guardian gives verbal consent for treatment and assignment of benefits for services provided during this visit. Patient/Guardian expressed understanding and agreed to proceed.   PGY-3 Lamar Sprinkles, MD 06/13/2023 8:35 AM

## 2023-06-13 NOTE — Patient Instructions (Addendum)
 [facility]: This center specializes in CBT, including HRT, and offers personalized treatment plans for various mental health challenges.  Address: [address], [place], [place]    [place], [place]: A therapist offering both in-person and virtual sessions, potentially specializing in HRT for body-focused repetitive behaviors.  Address: [address] [place], [place] [address]  Contact: [phone]  Email: [email or web address]    [facility]: Tobi Bastos, a therapist at [facility], specializes in treating OCD and related disorders, including body-focused repetitive behaviors, and may incorporate HRT in her treatment.  Address: [address], [place], [place] [address]    [facility], [facility] [address] [place], [place] [address] [phone], office

## 2023-07-22 ENCOUNTER — Telehealth (HOSPITAL_COMMUNITY): Payer: Self-pay | Admitting: *Deleted

## 2023-07-22 NOTE — Telephone Encounter (Signed)
 Patient's mother called stating that her daughters day program has told her that she is more aggressive than usual for the past 4 days. Mother is being called to pick her up early due to behaviors. Has also been more agitated at home. Behavior is escalating. Mother denies that she has changed any of her medications. Asking if she needs to be seen earlier or if any changes can be made.

## 2023-07-31 ENCOUNTER — Ambulatory Visit (INDEPENDENT_AMBULATORY_CARE_PROVIDER_SITE_OTHER): Payer: MEDICAID | Admitting: Student

## 2023-07-31 DIAGNOSIS — L659 Nonscarring hair loss, unspecified: Secondary | ICD-10-CM | POA: Diagnosis not present

## 2023-07-31 DIAGNOSIS — F424 Excoriation (skin-picking) disorder: Secondary | ICD-10-CM

## 2023-07-31 DIAGNOSIS — Z79899 Other long term (current) drug therapy: Secondary | ICD-10-CM

## 2023-07-31 DIAGNOSIS — F840 Autistic disorder: Secondary | ICD-10-CM

## 2023-07-31 DIAGNOSIS — F89 Unspecified disorder of psychological development: Secondary | ICD-10-CM

## 2023-07-31 DIAGNOSIS — F984 Stereotyped movement disorders: Secondary | ICD-10-CM

## 2023-08-04 MED ORDER — ACETYLCYSTEINE 600 MG PO CAPS: ORAL_CAPSULE | ORAL | 2 refills | Status: DC

## 2023-08-04 MED ORDER — CLONIDINE 0.2 MG/24HR TD PTWK
0.2000 mg | MEDICATED_PATCH | TRANSDERMAL | 2 refills | Status: DC
Start: 2023-08-04 — End: 2023-10-09

## 2023-08-04 MED ORDER — TRAZODONE HCL 50 MG PO TABS: 75.0000 mg | ORAL_TABLET | Freq: Every day | ORAL | 2 refills | Status: DC

## 2023-08-04 MED ORDER — ARIPIPRAZOLE 1 MG/ML PO SOLN: 5.0000 mg | Freq: Two times a day (BID) | ORAL | 2 refills | Status: DC

## 2023-08-05 ENCOUNTER — Encounter (HOSPITAL_COMMUNITY): Payer: Self-pay | Admitting: Student

## 2023-08-05 NOTE — Progress Notes (Signed)
 [facility] MD/PA/NP OP Progress Note  07/31/2023  8:35 AM  Gabrielle [name]  MRN:  [id number]  Chief Complaint:  Chief Complaint  Patient presents with   Follow-up   Medication Refill   Other    Mood and behavior changes   HPI: Gabrielle [name] is a 23 year old patient with a history of non-verbal autism, skin excoriation disorder, trichotillomania, tic disorder, developmental delay and akathisia of unknown etiology. Patient presents today with her mother.    Patient is compliant with the following medication regimen: Abilify  5 mg twice daily NAC 1200 mg twice daily Trazodone  75 mg nightly Prozac  10 mg (liquid solution)  Clonidine  0.2 mg qAM, 0.2 mg qPM, and 0.1 mg qHS   Per mom, [name] had significant irritability and return of aggression, and she has been unable to pinpoint the cause.  She has kept a calendar noting her days of worsening behavior, and it does seem to coincide around the time that both mom and her caregiver were on vacation.  However, when both returned, the behaviors persisted.  Mom is unsure that [name] is receiving her midday medications in full dosage, as caregiver does not document performing mouth checks and has not crushed her clonidine .  Discussed with mom adjusting the route of medications, switching as many to oral or patch formulations from pills.  Mom is in agreement with this plan to ensure that patient is receiving the full dosage of medications.   Gabrielle [name] has had some excoriations but not as much hair pulling over the past month.  However, her behaviors have become dangerous in that she tries to jump out of moving cars or becomes physically aggressive with staff at the day program.  As well, staff buckled her safety device as she was watching, and she figured out how to remove the device herself.  Mom is looking into other options for day programming if her current program feels as though they are unable to meet her needs.  Gabrielle [name] is otherwise still sleeping well and still  with rare occurrences of nocturnal enuresis.    Visit Diagnosis:    ICD-10-CM   1. Autism  F84.0 Acetylcysteine  600 MG CAPS    traZODone  (DESYREL ) 50 MG tablet    2. Developmental disability  F89     3. Patchy loss of hair  L65.9     4. 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 Long term current use of antipsychotic medication  Z79.899             Past Psychiatric History: 10/02/2021-patient was not having excoriating behaviors, medications were continued. 09/2021-patient started on Prozac  10 mg, liquid form and Abilify  10 mg daily changed to 5 mg twice daily 10/30/2021-decrease Prozac  back to 10 mg (2.5 mL), continued Abilify  5 mg twice daily, continue trazodone  100 mg nightly, started patient on Cogentin  1 mg daily 01/15/2022: Increase clonidine  to 0.1 mg 3 times daily due to worsening skin picking behaviors, continue all other medications.  Patient was also no longer bedwetting with the addition of Cogentin  1 mg daily.  Patient no longer appeared hypomanic on this assessment, was no longer eating animal food or garbage. 02/15/2022: Mom concern for possible TD like movements with the mouth, decrease patient's Cogentin  to 0.5 mg daily also suggested trying a scarf as patient appears to like picking at her neck and hanging onto her shirts and [name] of her dresses.  Continue all other medications. 03/15/2022:  Patient was extremely irritable and parents were endorsing aggression.  Patient was tearful and irritable during assessment and cannot be calmed down on previous assessments.  Patient was due for her IUD to be changed.  Mom was endorsing changes in behavior and patient endorsing pain that were reminiscent of symptoms of PMDD, discussed with mom increasing patient's Prozac  to 20 mg in the week before she starts her period as this may help with some of the behavior changes.  Patient scheduled the IUD change in 04/2022-we will also get labs at this  time. 04/2022-patient doing fairly well and no medication adjustments made.  Patient bedwetting did return with the discontinuation of Cogentin  however she is no longer having the lip smacking movements therefore it was not restarted.  Patient's behavior appeared to return more similar to baseline after her IUD was placed. 06/2022- Bedwetting picking up, but mom not very concerned, some emesis noted in the AM but again mom not concerned because it appeared to be more related to allergies and had happened around the same time of year in the past. No med adjustments 08/2022- Noted more aggressive behaviors, mom significantly concerned. Clonidine  increased to 0.2 mg-0.1 mg-0.1 mg x 2 weeks, then may go to 0.2 mg-0.2 mg-0.1 mg.  09/2022- increased midday clonidine  dosage. Decreased Trazodone  to 75 mg at bedtime 11/2022- no bedwetting episodes, improvement in behaviors.   01/2023- Day program with safety accommodations. Sustained improvement in behaviors, no bedwetting episodes.    First visit: Patient used to be seen by Dr. Akintayo, was later transferred to Dr. Marny Sires.  Patient was last seen by Dr. Marny Sires (pediatric neurologist) 4/10 who endorsed that patient should return to psychiatric care due to complexity of presentation.   Previous medications include: Celexa  (1 week trial, mom reports patient appeared to have trouble sleeping and a flatter affect), Lexapro, Luvox (failed), Zoloft, Focalin (became more agitated and dangerous to self) ropinirole, BuSpar, gabapentin, Trileptal, Lamictal, clonidine , Vistaril, NAC, Valium (prior to procedures), Klonopin, Versed  (prior to procedures), Ativan, triazolam (prior to procedures)  Mom endorse some concerns that when patient has tried SSRIs in the past she appears to become more "manic."  When asked for further details Mom reports that patient will become more restless, have outbursts of giggling and her sleep worsens.  Per both parents patient has  always had significant trouble sleeping until she was started on trazodone .  Medications at initial visit: Trazodone  100 mg (mom feels is over sedating but prefers 75 mg), Abilify  10 mg (treating agitation, irritability and impulsivity fairly well) clonidine  2 mg daily, NAC 2400 mg (per neurology for skin excoriation)  Past Medical History:  Past Medical History:  Diagnosis Date   Anemia    Anxiety    Autism disorder    per mother severe autism, very social,  good receptive languange but nonverbal (can do some sign languange) , high tolerence to pain   Dysmenorrhea    Menorrhagia    Nonverbal    PT AUTISTIC   PONV (postoperative nausea and vomiting)    per mother "when wakes up pt can go from 0 to 60 and starts taking off cords and try to sit up to stand, better when mother and father are they when she wakes up"   Repetitive movement    due to autism---  chronic picking of own skin and body    Past Surgical History:  Procedure Laterality Date   DENTAL RESTORATION/EXTRACTION WITH X-RAY     DILATION AND CURETTAGE  OF UTERUS N/A 04/07/2017   Procedure: DILATATION AND CURETTAGE;  Surgeon: Lillian Rein, MD;  Location: [facility];  Service: Gynecology;  Laterality: N/A;   INTRAUTERINE DEVICE (IUD) INSERTION N/A 04/07/2017   Procedure: PLACEMENT OF KYLEENA   IUD;  Surgeon: Lillian Rein, MD;  Location: [facility];  Service: Gynecology;  Laterality: N/A;   INTRAUTERINE DEVICE (IUD) INSERTION N/A 04/23/2022   Procedure: INTRAUTERINE DEVICE (IUD) INSERTION;  Surgeon: Lillian Rein, MD;  Location: [facility];  Service: Gynecology;  Laterality: N/A;   IUD REMOVAL N/A 04/23/2022   Procedure: INTRAUTERINE DEVICE (IUD) REMOVAL;  Surgeon: Lillian Rein, MD;  Location: [facility];  Service: Gynecology;  Laterality: N/A;   MASS EXCISION Left 06/15/2015   Procedure: EXCISION OF LEFT AXILLARY MASS/TETANUS INJECTION;  Surgeon: Thornell Flirt, DO;  Location: [facility];  Service: Plastics;  Laterality: Left;   OPERATIVE ULTRASOUND N/A 04/23/2022   Procedure: OPERATIVE ULTRASOUND;  Surgeon: Lillian Rein, MD;  Location: [facility];  Service: Gynecology;  Laterality: N/A;    Family Psychiatric History: Parents report some history of family members with depression    Family History:  Family History  Problem Relation Age of Onset   Heart disease Maternal Grandmother    Endometriosis Maternal Grandmother    Diabetes Maternal Grandfather    Cancer Maternal Grandfather        Skin Cancer   Asthma Mother    Hyperlipidemia Mother    Hypertension Paternal Grandfather    Hypertension Father    Hyperlipidemia Father    Diverticulosis Father    Arthritis Paternal Grandmother     Social History:  Social History   Socioeconomic History   Marital status: Single    Spouse name: Not on file   Number of children: Not on file   Years of education: Not on file   Highest education level: Not on file  Occupational History   Occupation: Medical illustrator in [place]  Tobacco Use   Smoking status: Never   Smokeless tobacco: Never  Vaping Use   Vaping status: Never Used  Substance and Sexual Activity   Alcohol use: Never   Drug use: Never   Sexual activity: Never    Birth control/protection: None  Other Topics Concern   Not on file  Social History Narrative   Patient lives with both parents. Anastacio Balm graduated from [employer]. She is now at [employer]       Behavior specialist consult with mom once a month.    Therapies: Not at the moment.    Social Drivers of Corporate investment banker Strain: Not on file  Food Insecurity: Not on file  Transportation Needs: Not on file  Physical Activity: Not on file  Stress: Not on file  Social Connections: Not on file    Allergies:  Allergies  Allergen Reactions   Cleaner [Ao-Sept Disinfection-Neutral] Hives    BRAND NAME - KABOOM     Metabolic Disorder Labs: Lab Results  Component Value Date   HGBA1C 5.3 04/23/2022   MPG 105.41 04/23/2022   MPG 108.28 08/11/2019   Lab Results  Component Value Date   PROLACTIN 7.9 04/23/2022   Lab Results  Component Value Date   CHOL 181 04/23/2022   TRIG 68 04/23/2022   HDL 31 (L) 04/23/2022   CHOLHDL 5.8 04/23/2022   VLDL 14 04/23/2022   LDLCALC 136 (H) 04/23/2022   LDLCALC  102 (H) 08/11/2019   Lab Results  Component Value Date   TSH 2.564 04/23/2022   TSH 2.188 08/11/2019    Therapeutic Level Labs: No results found for: "LITHIUM" No results found for: "VALPROATE" No results found for: "CBMZ"  Current Medications: Current Outpatient Medications  Medication Sig Dispense Refill   Aripiprazole  1 MG/ML mL Take 5 mLs (5 mg total) by mouth 2 (two) times daily. Take oral solution OR tablet if unable to tolerate oral solution. 300 mL 2   cloNIDine  (CATAPRES  - DOSED IN MG/24 HR) 0.2 mg/24hr patch Place 1 patch (0.2 mg total) onto the skin once a week. Continue oral dosing, as previously prescribed, for the first three days of wearing the patch, then discontinue. 4 patch 2   Acetylcysteine  600 MG CAPS Take 1 capsule (600 mg total) by mouth in the morning AND 2 capsules (1,200 mg total) at bedtime. Begin decreased dosage in 2 weeks. 120 capsule 2   ARIPiprazole  (ABILIFY ) 5 MG tablet Take 1 tablet (5 mg total) by mouth 2 (two) times daily. 60 tablet 2   cloNIDine  (CATAPRES ) 0.1 MG tablet Take 1 tablet (0.1 mg total) by mouth every evening. To be taken at 1800 60 tablet 2   cloNIDine  (CATAPRES ) 0.2 MG tablet Take 1 tablet (0.2 mg total) by mouth 2 (two) times daily. To be taken each morning and at 1400 60 tablet 2   FLUoxetine  (PROZAC ) 20 MG/5ML solution Take 2.5 mLs (10 mg total) by mouth daily. 120 mL 1   ibuprofen  (ADVIL ) 200 MG tablet Take 400 mg by mouth every 6 (six) hours as needed for fever, headache or mild pain.     KYLEENA  19.5 MG IUD 1 Device by Intrauterine route  once for 1 dose. 1 Intra Uterine Device 0   Multiple Vitamin (MULTIVITAMIN) tablet Take 1 tablet by mouth daily.     mupirocin  ointment (BACTROBAN ) 2 % Apply 1 application topically 2 (two) times daily. (Patient taking differently: Apply 1 application  topically 2 (two) times daily as needed.) 22 g 0   norethindrone -ethinyl estradiol-FE (LOESTRIN FE) 1-20 MG-MCG tablet Take 1 tablet by mouth daily. Skip placebo pills and take continuous active. (Patient not taking: Reported on 06/13/2023) 112 tablet 1   traZODone  (DESYREL ) 50 MG tablet Take 1.5 tablets (75 mg total) by mouth at bedtime. 45 tablet 2   No current facility-administered medications for this visit.     Musculoskeletal: Strength & Muscle Tone: within normal limits Gait & Station: normal Patient leans: N/A  Psychiatric Specialty Exam: Review of Systems  Psychiatric/Behavioral:  Positive for behavioral problems.     There were no vitals taken for this visit.There is no height or weight on file to calculate BMI.  General Appearance: Baseline, appropriately healing sores on hands and arms, wearing overalls. Patchy hair, noticeable when lifted but hair from the crown of her head covers it appropriately.   Eye Contact:  Fair  Speech:  NA  Volume:  Increased but baseline  Mood:  objectively euthymic, although some irritable  Affect:  NA  Thought Process:  NA  Orientation:  NA  Thought Content: NA   Suicidal Thoughts:  No  Homicidal Thoughts:  No  Memory:  NA  Judgement:  Poor  Insight:  Lacking  Psychomotor Activity:  Normal/ increased , but baseline for patient  Concentration:  Concentration: Poor  Recall:  NA  Fund of Knowledge: Poor  Language: Poor; nonverbal  Akathisia:  No  Handed:    AIMS (if  indicated): not done  Assets:  Housing Social Support  ADL's:  Impaired  Cognition: Impaired,  Severe  Sleep:  Good   Screenings: Flowsheet Row UC from 10/03/2020 in [facility])   C-SSRS RISK CATEGORY No Risk        Assessment and Plan: Mom endorses some worsening of aggressive and impulsive behaviors, which may coincide with mom and caregiver going on vacation disrupting her routine.  Her behaviors may compromise her day program's ability to care for her throughout the day.  Mom worries that patient is not receiving full benefit of her medications.  Mom also has concerns that long-term NAC may not be so beneficial for her.  We will change route of administration of Abilify  from pill to oral solution, and clonidine  from pill to patch (with 3-day oral overlap).  In 2 weeks, we will decrease NAC to 1 pill in the morning and 2 in the evening. No other changes to be made today to medications.   Autism Spectrum Disorder Skin picking excoriation disorder Tics - Continue Abilify  5 mg twice daily for mood stabilization.  Switch to oral solution.  Rx for pills also available in case patient does not tolerate solution - Continue trazodone  75 mg nightly - Continue Prozac  10 mg (liquid form), for skin picking excoriation and other repetitive behaviors.   - We will increase to clonidine  0.2mg  continuous release via patch.  Patient to continue previous dosing of 0.2/0.2/0.1 mg x 3 days and then discontinue oral dosing.  Patch to be changed weekly for impulse control. - Decrease to N-acetylcysteine  600 mg q a.m. and 1200 mg nightly for excoriations.  We will make this change in 2 weeks. - F/U in approximately 6 weeks   Hx akathisia, etiology unknown -Abnormal lip movements (concern for TD) when on Cogentin , resolved when Cogentin  discontinued (04/2022)   Collaboration of Care: Collaboration of Care: Dr. Eligio Grumbling  Patient/Guardian was advised Release of Information must be obtained prior to any record release in order to collaborate their care with an outside provider. Patient/Guardian was advised if they have not already done so to contact the registration department to sign all  necessary forms in order for us  to release information regarding their care.   Consent: Patient/Guardian gives verbal consent for treatment and assignment of benefits for services provided during this visit. Patient/Guardian expressed understanding and agreed to proceed.   PGY-3 Shery Done, MD 07/31/2023  8:35 AM

## 2023-08-22 ENCOUNTER — Ambulatory Visit (INDEPENDENT_AMBULATORY_CARE_PROVIDER_SITE_OTHER): Payer: MEDICAID | Admitting: Student

## 2023-08-22 ENCOUNTER — Encounter (HOSPITAL_COMMUNITY): Payer: Self-pay | Admitting: Student

## 2023-08-22 DIAGNOSIS — F984 Stereotyped movement disorders: Secondary | ICD-10-CM

## 2023-08-22 DIAGNOSIS — F89 Unspecified disorder of psychological development: Secondary | ICD-10-CM

## 2023-08-22 DIAGNOSIS — G2569 Other tics of organic origin: Secondary | ICD-10-CM

## 2023-08-22 DIAGNOSIS — F840 Autistic disorder: Secondary | ICD-10-CM

## 2023-08-22 DIAGNOSIS — L659 Nonscarring hair loss, unspecified: Secondary | ICD-10-CM | POA: Diagnosis not present

## 2023-08-22 DIAGNOSIS — Z79899 Other long term (current) drug therapy: Secondary | ICD-10-CM

## 2023-08-22 DIAGNOSIS — F424 Excoriation (skin-picking) disorder: Secondary | ICD-10-CM | POA: Diagnosis not present

## 2023-08-22 DIAGNOSIS — G2571 Drug induced akathisia: Secondary | ICD-10-CM

## 2023-08-22 NOTE — Progress Notes (Signed)
 [facility] MD/PA/NP OP Progress Note  08/22/2023  9:35 AM  [facility]  MRN:  [id number]  Chief Complaint:  Chief Complaint  Patient presents with   Follow-up   Agitation   Medication Refill   HPI: Gabrielle [name] is a 23 year old patient with a history of non-verbal autism, skin excoriation disorder, trichotillomania, tic disorder, developmental delay and akathisia of unknown etiology. Patient presents today with her mother.    Patient is compliant with the following medication regimen: Abilify  5 mg twice daily (oral solution) NAC 1200 mg twice daily (decreased to 600 mg every morning and 1200 mg nightly.  Mom decreased to 600 mg twice daily) Trazodone  75 mg nightly Prozac  10 mg (oral solution)  Clonidine  0.2 mg qAM, 0.2 mg qPM, and 0.1 mg qHS (trial of 0.2 mg patch)   Per mom, a bunion was observed on Gabrielle [name]'s foot since last visit, and as relief was provided, Gabrielle [name]'s aggression improved significantly.  She has only noted 1 day of aggressive behaviors from Gabrielle [name] since her bunion pain was relieved.  Additional, Gabrielle [name] has been tolerating the oral solution of Abilify  appropriately.  However, she has not maintained the clonidine  patch in place.  Mom is still unsure whether Gabrielle [name] is receiving her midday medications in full dosage, particularly her clonidine .  Gabrielle [name] has again had some excoriations but not as much hair pulling over the past month. Gabrielle [name] did not keep the Clonidine  patch in place, but mom is unsure if that was due to sweat or [place]nn removing it. She is willing to trial the patch again. Gabrielle [name] is tolerating oral solution of Abilify  without issue. Mom decreased Gabrielle [name]'s NAC to 600 mg BID, with the plan to continue to taper to 600 mg at bedtime only.    Gabrielle [name] is otherwise still sleeping well and still with rare occurrences of nocturnal enuresis. Mom denies safety concerns toward herself or others.    Visit Diagnosis:    ICD-10-CM   1. Autism  F84.0     2. Developmental disability   F89     3. Patchy loss of hair  L65.9     4. 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 Long term current use of antipsychotic medication  Z79.899     7. Akathisia  G25.71     8. 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 09/2021-patient started on Prozac  10 mg, liquid form and Abilify  10 mg daily changed to 5 mg twice daily 10/30/2021-decrease Prozac  back to 10 mg (2.5 mL), continued Abilify  5 mg twice daily, continue trazodone  100 mg nightly, started patient on Cogentin  1 mg daily 01/15/2022: Increase clonidine  to 0.1 mg 3 times daily due to worsening skin picking behaviors, continue all other medications.  Patient was also no longer bedwetting with the addition of Cogentin  1 mg daily.  Patient no longer appeared hypomanic on this assessment, was no longer eating animal food or garbage. 02/15/2022: Mom concern for possible TD like movements with the mouth, decrease patient's Cogentin  to 0.5 mg daily also suggested trying a scarf as patient appears to like picking at her neck and hanging onto her shirts and [name] of her dresses.  Continue all other medications. 03/15/2022: Patient was extremely irritable and parents were endorsing aggression.  Patient was tearful and irritable during assessment and cannot  be calmed down on previous assessments.  Patient was due for her IUD to be changed.  Mom was endorsing changes in behavior and patient endorsing pain that were reminiscent of symptoms of PMDD, discussed with mom increasing patient's Prozac  to 20 mg in the week before she starts her period as this may help with some of the behavior changes.  Patient scheduled the IUD change in 04/2022-we will also get labs at this time. 04/2022-patient doing fairly well and no medication adjustments made.  Patient bedwetting did return with the discontinuation  of Cogentin  however she is no longer having the lip smacking movements therefore it was not restarted.  Patient's behavior appeared to return more similar to baseline after her IUD was placed. 06/2022- Bedwetting picking up, but mom not very concerned, some emesis noted in the AM but again mom not concerned because it appeared to be more related to allergies and had happened around the same time of year in the past. No med adjustments 08/2022- Noted more aggressive behaviors, mom significantly concerned. Clonidine  increased to 0.2 mg-0.1 mg-0.1 mg x 2 weeks, then may go to 0.2 mg-0.2 mg-0.1 mg.  09/2022- increased midday clonidine  dosage. Decreased Trazodone  to 75 mg at bedtime 11/2022- no bedwetting episodes, improvement in behaviors.   01/2023- Day program with safety accommodations. Sustained improvement in behaviors, no bedwetting episodes.  07/2023- Patient with aggressive behaviors. Mom unsure if she is actually receiving all medications fully. Will switch Abilify  to oral solution and Clonidine  to patch to ensure consistent dosage.    First visit: Patient used to be seen by Dr. Akintayo, was later transferred to Dr. Marny Sires.  Patient was last seen by Dr. Marny Sires (pediatric neurologist) 4/10 who endorsed that patient should return to psychiatric care due to complexity of presentation.   Previous medications include: Celexa  (1 week trial, mom reports patient appeared to have trouble sleeping and a flatter affect), Lexapro, Luvox (failed), Zoloft, Focalin (became more agitated and dangerous to self) ropinirole, BuSpar, gabapentin, Trileptal, Lamictal, clonidine , Vistaril, NAC, Valium (prior to procedures), Klonopin, Versed  (prior to procedures), Ativan, triazolam (prior to procedures)  Mom endorse some concerns that when patient has tried SSRIs in the past she appears to become more "manic."  When asked for further details Mom reports that patient will become more restless, have outbursts  of giggling and her sleep worsens.  Per both parents patient has always had significant trouble sleeping until she was started on trazodone .  Medications at initial visit: Trazodone  100 mg (mom feels is over sedating but prefers 75 mg), Abilify  10 mg (treating agitation, irritability and impulsivity fairly well) clonidine  2 mg daily, NAC 2400 mg (per neurology for skin excoriation)  Past Medical History:  Past Medical History:  Diagnosis Date   Anemia    Anxiety    Autism disorder    per mother severe autism, very social,  good receptive languange but nonverbal (can do some sign languange) , high tolerence to pain   Dysmenorrhea    Menorrhagia    Nonverbal    PT AUTISTIC   PONV (postoperative nausea and vomiting)    per mother "when wakes up pt can go from 0 to 60 and starts taking off cords and try to sit up to stand, better when mother and father are they when she wakes up"   Repetitive movement    due to autism---  chronic picking of own skin and body    Past Surgical History:  Procedure Laterality Date  DENTAL RESTORATION/EXTRACTION WITH X-RAY     DILATION AND CURETTAGE OF UTERUS N/A 04/07/2017   Procedure: DILATATION AND CURETTAGE;  Surgeon: Lillian Rein, MD;  Location: [facility];  Service: Gynecology;  Laterality: N/A;   INTRAUTERINE DEVICE (IUD) INSERTION N/A 04/07/2017   Procedure: PLACEMENT OF KYLEENA   IUD;  Surgeon: Lillian Rein, MD;  [facility];  Service: Gynecology;  Laterality: N/A;   INTRAUTERINE DEVICE (IUD) INSERTION N/A 04/23/2022   Procedure: INTRAUTERINE DEVICE (IUD) INSERTION;  Surgeon: Lillian Rein, MD;  Location: [facility];  Service: Gynecology;  Laterality: N/A;   IUD REMOVAL N/A 04/23/2022   Procedure: INTRAUTERINE DEVICE (IUD) REMOVAL;  Surgeon: Lillian Rein, MD;  Location: [facility];  Service: Gynecology;  Laterality: N/A;   MASS EXCISION Left 06/15/2015   Procedure: EXCISION OF  LEFT AXILLARY MASS/TETANUS INJECTION;  Surgeon: Thornell Flirt, DO;  Location: [facility];  Service: Plastics;  Laterality: Left;   OPERATIVE ULTRASOUND N/A 04/23/2022   Procedure: OPERATIVE ULTRASOUND;  Surgeon: Lillian Rein, MD;  Location: [name];  Service: Gynecology;  Laterality: N/A;    Family Psychiatric History: Parents report some history of family members with depression    Family History:  Family History  Problem Relation Age of Onset   Heart disease Maternal Grandmother    Endometriosis Maternal Grandmother    Diabetes Maternal Grandfather    Cancer Maternal Grandfather        Skin Cancer   Asthma Mother    Hyperlipidemia Mother    Hypertension Paternal Grandfather    Hypertension Father    Hyperlipidemia Father    Diverticulosis Father    Arthritis Paternal Grandmother     Social History:  Social History   Socioeconomic History   Marital status: Single    Spouse name: Not on file   Number of children: Not on file   Years of education: Not on file   Highest education level: Not on file  Occupational History   Occupation: Medical illustrator in [place]  Tobacco Use   Smoking status: Never   Smokeless tobacco: Never  Vaping Use   Vaping status: Never Used  Substance and Sexual Activity   Alcohol use: Never   Drug use: Never   Sexual activity: Never    Birth control/protection: None  Other Topics Concern   Not on file  Social History Narrative   Patient lives with both parents. Anastacio Balm graduated from [employer]. She is now at [employer]       Behavior specialist consult with mom once a month.    Therapies: Not at the moment.    Social Drivers of Corporate investment banker Strain: Not on file  Food Insecurity: Not on file  Transportation Needs: Not on file  Physical Activity: Not on file  Stress: Not on file  Social Connections: Not on file    Allergies:  Allergies  Allergen Reactions   Cleaner  [Ao-Sept Disinfection-Neutral] Hives    BRAND NAME - KABOOM    Metabolic Disorder Labs: Lab Results  Component Value Date   HGBA1C 5.3 04/23/2022   MPG 105.41 04/23/2022   MPG 108.28 08/11/2019   Lab Results  Component Value Date   PROLACTIN 7.9 04/23/2022   Lab Results  Component Value Date   CHOL 181 04/23/2022   TRIG 68 04/23/2022   HDL 31 (L) 04/23/2022   CHOLHDL 5.8 04/23/2022   VLDL  14 04/23/2022   LDLCALC 136 (H) 04/23/2022   LDLCALC 102 (H) 08/11/2019   Lab Results  Component Value Date   TSH 2.564 04/23/2022   TSH 2.188 08/11/2019    Therapeutic Level Labs: No results found for: "LITHIUM" No results found for: "VALPROATE" No results found for: "CBMZ"  Current Medications: Current Outpatient Medications  Medication Sig Dispense Refill   Acetylcysteine  600 MG CAPS Take 1 capsule (600 mg total) by mouth in the morning AND 2 capsules (1,200 mg total) at bedtime. Begin decreased dosage in 2 weeks. 120 capsule 2   ARIPiprazole  (ABILIFY ) 5 MG tablet Take 1 tablet (5 mg total) by mouth 2 (two) times daily. 60 tablet 2   Aripiprazole  1 MG/ML mL Take 5 mLs (5 mg total) by mouth 2 (two) times daily. Take oral solution OR tablet if unable to tolerate oral solution. 300 mL 2   cloNIDine  (CATAPRES  - DOSED IN MG/24 HR) 0.2 mg/24hr patch Place 1 patch (0.2 mg total) onto the skin once a week. Continue oral dosing, as previously prescribed, for the first three days of wearing the patch, then discontinue. 4 patch 2   cloNIDine  (CATAPRES ) 0.1 MG tablet Take 1 tablet (0.1 mg total) by mouth every evening. To be taken at 1800 60 tablet 2   cloNIDine  (CATAPRES ) 0.2 MG tablet Take 1 tablet (0.2 mg total) by mouth 2 (two) times daily. To be taken each morning and at 1400 60 tablet 2   FLUoxetine  (PROZAC ) 20 MG/5ML solution Take 2.5 mLs (10 mg total) by mouth daily. 120 mL 1   ibuprofen  (ADVIL ) 200 MG tablet Take 400 mg by mouth every 6 (six) hours as needed for fever, headache or mild  pain.     KYLEENA  19.5 MG IUD 1 Device by Intrauterine route once for 1 dose. 1 Intra Uterine Device 0   Multiple Vitamin (MULTIVITAMIN) tablet Take 1 tablet by mouth daily.     mupirocin  ointment (BACTROBAN ) 2 % Apply 1 application topically 2 (two) times daily. (Patient taking differently: Apply 1 application  topically 2 (two) times daily as needed.) 22 g 0   norethindrone -ethinyl estradiol-FE (LOESTRIN FE) 1-20 MG-MCG tablet Take 1 tablet by mouth daily. Skip placebo pills and take continuous active. (Patient not taking: Reported on 06/13/2023) 112 tablet 1   traZODone  (DESYREL ) 50 MG tablet Take 1.5 tablets (75 mg total) by mouth at bedtime. 45 tablet 2   No current facility-administered medications for this visit.     Musculoskeletal: Strength & Muscle Tone: within normal limits Gait & Station: normal Patient leans: N/A  Psychiatric Specialty Exam: Review of Systems  Psychiatric/Behavioral:  Positive for behavioral problems.     There were no vitals taken for this visit.There is no height or weight on file to calculate BMI.  General Appearance: Baseline, appropriately healing sores on hands and arms, wearing overalls. Patchy hair, noticeable when lifted but hair from the crown of her head covers it appropriately.   Eye Contact:  Fair  Speech:  NA  Volume:  Increased but baseline  Mood:  objectively euthymic, although some irritable  Affect:  NA  Thought Process:  NA  Orientation:  NA  Thought Content: NA   Suicidal Thoughts:  No  Homicidal Thoughts:  No  Memory:  NA  Judgement:  Poor  Insight:  Lacking  Psychomotor Activity:  Normal/ increased , but baseline for patient  Concentration:  Concentration: Poor  Recall:  NA  Fund of Knowledge: Poor  Language: Poor; nonverbal  Akathisia:  No  Handed:    AIMS (if indicated): not done  Assets:  Housing Social Support  ADL's:  Impaired  Cognition: Impaired,  Severe  Sleep:  Good   Screenings: Flowsheet Row UC from  10/03/2020 in [facility])  C-SSRS RISK CATEGORY No Risk        Assessment and Plan: Mom endorses significant improvement of aggressive and impulsive behaviors after noticing and treating a bunion. Patient is tolerating liquid Abilify  well. She has not yet been able to tolerate Clonidine  patch. Suggested to mom to trial a bandage of similar size until [place] tolerates it, then switch to patch to avoid removal.  Mom tapered NAC to 600 mg BID, with plan to continue to taper.  We will continue Abilify  oral solution, as patient has tolerated it well. Mom will attempt clonidine  patch again (with 3-day oral overlap) or continue pills. No other changes to be made today to medications.   Future directions: If patient unable to tolerate Clonidine  patch, assess whether insurance will cover oral solution. May also consider Kapvay  (the only caveat is patient's difficulty swallowing pills and the inability to crush ER formulation).   Autism Spectrum Disorder Skin picking excoriation disorder Tics - Continue Abilify  5 mg oral solution twice daily for mood stabilization.  - Continue trazodone  75 mg nightly - Continue Prozac  10 mg (oral solution), for skin picking excoriation and other repetitive behaviors.   - Continue clonidine  0.2mg  continuous release via patch, as tolerated. If not, patient to continue previous dosing of 0.2/0.2/0.1 mg. Patch to be changed weekly for impulse control. - Continue N-acetylcysteine  600 mg BID, with the plan to taper to 600 mg qHS for excoriations.    Hx akathisia, etiology unknown -Abnormal lip movements (concern for TD) when on Cogentin , resolved when Cogentin  discontinued (04/2022)   F/U in approximately 6 weeks with Dr. Chien, as this writer will be leaving the practice in late June.   Collaboration of Care: Collaboration of Care: Dr. Jenna Moan  Patient/Guardian was advised Release of Information must be obtained prior to any  record release in order to collaborate their care with an outside provider. Patient/Guardian was advised if they have not already done so to contact the registration department to sign all necessary forms in order for us  to release information regarding their care.   Consent: Patient/Guardian gives verbal consent for treatment and assignment of benefits for services provided during this visit. Patient/Guardian expressed understanding and agreed to proceed.   PGY-3 Shery Done, MD 08/22/2023  9:35 AM

## 2023-08-26 ENCOUNTER — Telehealth (HOSPITAL_COMMUNITY): Payer: Self-pay

## 2023-08-26 NOTE — Telephone Encounter (Signed)
 Hello,   Mother states that her daughters attendance day program for medication papers needs to be filled out. Stating that she was with Dr. Lorna Rose but moving to Dr. Timoteo Force and needs provider to fill out and that she will email this to [email or web address] or drop them off at the front desk for Dr. Timoteo Force or Dr. Lorna Rose.   Unsure who to route this too as she is addressing both providers for these forms when they do come.   JNL

## 2023-08-26 NOTE — Telephone Encounter (Signed)
 Please send to me when they come in, thanks.   Dr. Lorna Rose

## 2023-10-07 NOTE — Progress Notes (Unsigned)
 BH MD Outpatient Progress Note  10/09/2023 12:56 PM Gabrielle [name]  MRN:  [id number]  Assessment:  Gabrielle [name] presents for follow-up evaluation. Today, 10/09/23, mom reports that patient has had decrease in behavioral events with switching from a full day at the day program to a half day. Unable to tolerate clonidine  patch due to patient staying outdoors and patch falling off, mom has continued with the clonidine  pills. She has been compliant with other medications including trazodone , abilify  and prozac . Mom tapered patient off of NAC and had not noticed increase in patient's skin picking and had actually noticed improvement in patient's hair pulling. We discussed will continue PO clonidine , abilify , prozac , and trazodone  and can trial patch when weather gets cooler. Discussed will want to obtain metabolic monitoring labs for abilify  if patient has upcoming procedure and mom reports patient may need sedation for upcoming dental cleaning so informed her to let us  know so that we can coordinate obtaining labs. Mom also continues to work on getting patient connected with a behavioral support plan, has a functional behavioral assessment scheduled for tomorrow.   The risks/benefits/side-effects/alternatives to this medication were discussed in detail with the patient and time was given for questions. The patient consents to medication trial.   Identifying Information: Gabrielle [name] is a 23 y.o. female with a history of non-verbal autism, skin excoriation disorder, trichotillomania, tic disorder, developmental delay and akathisia who is an established patient with [facility] for management of medications.  Risk Assessment: An assessment of suicide and violence risk factors was performed as part of this evaluation and is not significantly changed from the last visit.             While future psychiatric events cannot be accurately predicted, the patient does not currently require  acute inpatient psychiatric care and does not currently meet [place]  involuntary commitment criteria.          Plan:  # Autism Spectrum Disorder # Developmental delay  Past medication trials: Celexa  (1 week trial, mom reports patient appeared to have trouble sleeping and a flatter affect), Lexapro, Luvox (failed), Zoloft, Focalin (became more agitated and dangerous to self) ropinirole, BuSpar, gabapentin, Trileptal, Lamictal, clonidine , Vistaril, NAC, Valium (prior to procedures), Klonopin, Versed  (prior to procedures), Ativan, triazolam (prior to procedures) Status of problem: ongoing Interventions: -- Continue abilify  oral 5mg  BID for agitation, irritability and impulsivity  Due for A1c and lipid panel, parent reports patient is due for dental cleaning and may be sedated during that time and can coordinate for labs.   -- Continue trazodone  75mg  at bedtime   # Excoriation disorder (skin picking) # Trichotillomania  # Stereotypic Movement Disorder Past medication trials:  Status of problem: ongoing Interventions: -- Continue prozac  10mg  for anxiety, skin picking and hair pulling -- Stopped NAC 600mg  BID for excoriations  -- Continue clonidine  0.2mg  AM, 0.2mg  afternoon, 0.1mg  at bedtime   -- Stop clonidine  patch for now due to ineffectiveness with warm weather  Return to care in  Future Appointments  Date Time Provider Department Center  11/20/2023  3:00 PM Graham Krabbe, MD [facility] None   Patient was given contact information for behavioral health clinic and was instructed to call 911 for emergencies.   Patient and plan of care will be discussed with the Attending MD ,Dr. Susen, who agrees with the above statement and plan.   Subjective:  Chief Complaint: No chief complaint on file.  Interval History:  Pre-charting: problem list, meds,  PDMP  Past psychiatric history: encounters Labs: CMP, levels, UDS  04/2022: CBC, CMP largely wnl. Iron panel wnl. A1c wnl. LDL  elevated 136. Prolactin and TSH wnl.   Patient is seen pacing around the room with mom, has intermittent times where she puts her hands to her face. Mom confirms current medication regimen of abilify , clonidine  and prozac . Reports that they have tapered Gabrielle off the NAC and noticed that she has less hair pulling (shows hair grown back in the back) though she still has cyclical skin picking that she states is not worse than prior. Mom brought calendar of patient's behavior and reports that patient had increased aggressive behaviors prior to her period this month. She continues on her IUD. Reports that they tried the clonidine  patch and it worked for 2 days but then she found it in the driveway. Reports patient's behavior at the day program had escalated and patient had behavior of pulling on a staff member's shirt and throwing items off the table. Reports that she hypothesized it was due to patient having to be in a crowded room with other participants and doing activities such as drawing in the afternoon that she didn't enjoy which the staff would then take her outside which is what the patient wanted. Reports that patient is now only at the day program for a half day and she has not had as many behavioral events though it has been more difficult for mom due to losing the support of a tech that used to help during the evening time. Reports that patient is still grabbing food and intermittently picking. Reports patient has good sleep with the trazodone  and has increased appetite. Has not noted any additional side effects from the medication or changes in her mood. Discussed will not make any additional changes to medication regimen and discussed using the clonidine  tablets for now. Mom was in agreement. Mom reports patient may be sedated for dental procedure and could get updated metabolic labs done then.   Visit Diagnosis:    ICD-10-CM   1. Autism  F84.0 Aripiprazole  1 MG/ML mL    cloNIDine  (CATAPRES ) 0.2  MG tablet    cloNIDine  (CATAPRES ) 0.1 MG tablet    traZODone  (DESYREL ) 50 MG tablet    FLUoxetine  (PROZAC ) 20 MG/5ML solution    2. Developmental disability  F89     3. Stereotyped movement disorder  F98.4     4. Long term current use of antipsychotic medication  Z79.899       Past Psychiatric History:  Diagnoses: stereotypic movement disorder, developmental disability, autism Medication trials:   Current: abilify  5mg  BID, clonidine  0.1mg  PM, clonidine  0.2 BID, prozac  10mg  daily  Past: Celexa  (1 week trial, mom reports patient appeared to have trouble sleeping and a flatter affect), Lexapro, Luvox (failed), Zoloft, Focalin (became more agitated and dangerous to self) ropinirole, BuSpar, gabapentin, Trileptal, Lamictal, clonidine , Vistaril, NAC, Valium (prior to procedures), Klonopin, Versed  (prior to procedures), Ativan, triazolam (prior to procedures)  Previous psychiatrist/therapist: Dr. Rainelle, Dr. Juleen Substance use: UDS, PDMP ativan 2mg  1# 08/20/2023  Past Medical History:  Past Medical History:  Diagnosis Date   Anemia    Anxiety    Autism disorder    per mother severe autism, very social,  good receptive languange but nonverbal (can [name] some sign languange) , high tolerence to pain   Dysmenorrhea    Menorrhagia    Nonverbal    PT AUTISTIC   PONV (postoperative nausea and vomiting)    per  mother when wakes up pt can go from 0 to 60 and starts taking off cords and try to sit up to stand, better when mother and father are they when she wakes up   Repetitive movement    due to autism---  chronic picking of own skin and body    Past Surgical History:  Procedure Laterality Date   DENTAL RESTORATION/EXTRACTION WITH X-RAY     DILATION AND CURETTAGE OF UTERUS N/A 04/07/2017   Procedure: DILATATION AND CURETTAGE;  Surgeon: Cleotilde Ronal RAMAN, MD;  Location: [facility];  Service: Gynecology;  Laterality: N/A;   INTRAUTERINE DEVICE (IUD) INSERTION N/A 04/07/2017    Procedure: PLACEMENT OF KYLEENA   IUD;  Surgeon: Cleotilde Ronal RAMAN, MD;  Location[facility];  Service: Gynecology;  Laterality: N/A;   INTRAUTERINE DEVICE (IUD) INSERTION N/A 04/23/2022   Procedure: INTRAUTERINE DEVICE (IUD) INSERTION;  Surgeon: Cleotilde Ronal RAMAN, MD;  Location: [facility];  Service: Gynecology;  Laterality: N/A;   IUD REMOVAL N/A 04/23/2022   Procedure: INTRAUTERINE DEVICE (IUD) REMOVAL;  Surgeon: Cleotilde Ronal RAMAN, MD;  Location: [facility];  Service: Gynecology;  Laterality: N/A;   MASS EXCISION Left 06/15/2015   Procedure: EXCISION OF LEFT AXILLARY MASS/TETANUS INJECTION;  Surgeon: Gabrielle RAMAN Fritter, [name];  Location: [facility];  Service: Plastics;  Laterality: Left;   OPERATIVE ULTRASOUND N/A 04/23/2022   Procedure: OPERATIVE ULTRASOUND;  Surgeon: Cleotilde Ronal RAMAN, MD;  Location: [facility];  Service: Gynecology;  Laterality: N/A;   Contraception: IUD  Family Psychiatric History: Parents report some history of family members with depression    Family History:  Family History  Problem Relation Age of Onset   Heart disease Maternal Grandmother    Endometriosis Maternal Grandmother    Diabetes Maternal Grandfather    Cancer Maternal Grandfather        Skin Cancer   Asthma Mother    Hyperlipidemia Mother    Hypertension Paternal Grandfather    Hypertension Father    Hyperlipidemia Father    Diverticulosis Father    Arthritis Paternal Grandmother     Social History:  Academic/Vocational: Lives with both parents. Attends half day at the day program.   Substance Use History:   Social History   Socioeconomic History   Marital status: Single    Spouse name: Not on file   Number of children: Not on file   Years of education: Not on file   Highest education level: Not on file  Occupational History   Occupation: Medical illustrator in [place]  Tobacco Use   Smoking status: Never   Smokeless tobacco: Never   Vaping Use   Vaping status: Never Used  Substance and Sexual Activity   Alcohol use: Never   Drug use: Never   Sexual activity: Never    Birth control/protection: None  Other Topics Concern   Not on file  Social History Narrative   Patient lives with both parents. Gabrielle graduated from [employer]. She is now at [employer]       Behavior specialist consult with mom once a month.    Therapies: Not at the moment.    Social Drivers of Corporate investment banker Strain: Not on file  Food Insecurity: Not on file  Transportation Needs: Not on file  Physical Activity: Not on file  Stress: Not on file  Social Connections: Not on file    Allergies:  Allergies  Allergen Reactions  Cleaner [Ao-Sept Disinfection-Neutral] Hives    BRAND NAME - KABOOM    Current Medications: Current Outpatient Medications  Medication Sig Dispense Refill   Aripiprazole  1 MG/ML mL Take 5 mLs (5 mg total) by mouth 2 (two) times daily. Take oral solution OR tablet if unable to tolerate oral solution. 300 mL 2   cloNIDine  (CATAPRES ) 0.1 MG tablet Take 1 tablet (0.1 mg total) by mouth every evening. To be taken at 1800 30 tablet 2   cloNIDine  (CATAPRES ) 0.2 MG tablet Take 1 tablet (0.2 mg total) by mouth 2 (two) times daily. To be taken each morning and at 1400 60 tablet 2   FLUoxetine  (PROZAC ) 20 MG/5ML solution Take 2.5 mLs (10 mg total) by mouth daily. 75 mL 2   ibuprofen  (ADVIL ) 200 MG tablet Take 400 mg by mouth every 6 (six) hours as needed for fever, headache or mild pain.     KYLEENA  19.5 MG IUD 1 Device by Intrauterine route once for 1 dose. 1 Intra Uterine Device 0   Multiple Vitamin (MULTIVITAMIN) tablet Take 1 tablet by mouth daily.     mupirocin  ointment (BACTROBAN ) 2 % Apply 1 application topically 2 (two) times daily. (Patient taking differently: Apply 1 application  topically 2 (two) times daily as needed.) 22 g 0   norethindrone -ethinyl estradiol-FE (LOESTRIN FE) 1-20  MG-MCG tablet Take 1 tablet by mouth daily. Skip placebo pills and take continuous active. (Patient not taking: Reported on 06/13/2023) 112 tablet 1   traZODone  (DESYREL ) 50 MG tablet Take 1.5 tablets (75 mg total) by mouth at bedtime. 45 tablet 2   No current facility-administered medications for this visit.    ROS: Review of Systems - unable to assess   Objective:  Psychiatric Specialty Exam: There were no vitals taken for this visit.There is no height or weight on file to calculate BMI.  General Appearance: Casual, wearing tie dye shirt and black overalls  Eye Contact:  Minimal  Speech:  Garbled  Volume:  Increased  Mood:  unable to assess  Affect:  Appropriate  Thought Content: unable to assess   Suicidal Thoughts:  unable to assess  Homicidal Thoughts:  unable to assess  Thought Process:  unable to assess  Orientation:  Other:  unable to assess    Memory: unable to assess  Judgment:  Impaired  Insight:  Lacking  Concentration:  Concentration: Poor  Recall: not formally assessed   Fund of Knowledge: Poor  Language: Poor  Psychomotor Activity:  Increased and Restlessness  Akathisia:  pacing around the room, not increased per mom  AIMS (if indicated): done. No stiffness or cogwheeling in extremities  Assets:  Housing Social Support Vocational/Educational  ADL's:  Intact  Cognition: WNL  Sleep:  Fair   PE: General: no acute distress  Pulm: no increased work of breathing on room air  Strength & Muscle Tone: within normal limits Neuro: no focal neurological deficits observed  Gait & Station: normal  Metabolic Disorder Labs: Lab Results  Component Value Date   HGBA1C 5.3 04/23/2022   MPG 105.41 04/23/2022   MPG 108.28 08/11/2019   Lab Results  Component Value Date   PROLACTIN 7.9 04/23/2022   Lab Results  Component Value Date   CHOL 181 04/23/2022   TRIG 68 04/23/2022   HDL 31 (L) 04/23/2022   CHOLHDL 5.8 04/23/2022   VLDL 14 04/23/2022   LDLCALC 136  (H) 04/23/2022   LDLCALC 102 (H) 08/11/2019   Lab Results  Component Value Date  TSH 2.564 04/23/2022   TSH 2.188 08/11/2019    Therapeutic Level Labs: No results found for: LITHIUM No results found for: VALPROATE No results found for: CBMZ  Screenings:  Flowsheet Row UC from 10/03/2020 in [facility])  C-SSRS RISK CATEGORY No Risk    Collaboration of Care: Collaboration of Care: Medication Management AEB Dr. Susen   Patient/Guardian was advised Release of Information must be obtained prior to any record release in order to collaborate their care with an outside provider. Patient/Guardian was advised if they have not already done so to contact the registration department to sign all necessary forms in order for us  to release information regarding their care.   Consent: Patient/Guardian gives verbal consent for treatment and assignment of benefits for services provided during this visit. Patient/Guardian expressed understanding and agreed to proceed.   Corean Minor, MD, PGY-3 10/09/2023, 12:56 PM

## 2023-10-09 ENCOUNTER — Ambulatory Visit (INDEPENDENT_AMBULATORY_CARE_PROVIDER_SITE_OTHER): Payer: MEDICAID | Admitting: Psychiatry

## 2023-10-09 DIAGNOSIS — F840 Autistic disorder: Secondary | ICD-10-CM

## 2023-10-09 DIAGNOSIS — F984 Stereotyped movement disorders: Secondary | ICD-10-CM

## 2023-10-09 DIAGNOSIS — Z79899 Other long term (current) drug therapy: Secondary | ICD-10-CM | POA: Diagnosis not present

## 2023-10-09 DIAGNOSIS — F89 Unspecified disorder of psychological development: Secondary | ICD-10-CM

## 2023-10-09 MED ORDER — CLONIDINE HCL 0.1 MG PO TABS: 0.1000 mg | ORAL_TABLET | Freq: Every evening | ORAL | 2 refills | Status: DC

## 2023-10-09 MED ORDER — ARIPIPRAZOLE 1 MG/ML PO SOLN: 5.0000 mg | Freq: Two times a day (BID) | ORAL | 2 refills | Status: DC

## 2023-10-09 MED ORDER — FLUOXETINE HCL 20 MG/5ML PO SOLN: 10.0000 mg | Freq: Every day | ORAL | 2 refills | Status: DC

## 2023-10-09 MED ORDER — TRAZODONE HCL 50 MG PO TABS: 75.0000 mg | ORAL_TABLET | Freq: Every day | ORAL | 2 refills | Status: DC

## 2023-10-09 MED ORDER — CLONIDINE HCL 0.2 MG PO TABS: 0.2000 mg | ORAL_TABLET | Freq: Two times a day (BID) | ORAL | 2 refills | Status: DC

## 2023-11-13 NOTE — Progress Notes (Signed)
 BH MD Outpatient Progress Note  11/20/2023 5:31 PM CAPTOLA TESCHNER  MRN:  [id number]  Assessment:  Gabrielle [name] presents for follow-up evaluation. Today, 11/20/23, mom and dad report that patient has had increase in behavioral events and in her aggression and impulsivity. She has been compliant with other medications including trazodone , abilify  and prozac . No new medical issues noted by parents. We discussed increasing abilify  to target patient's impulsivity and aggression given parents reported benefit from abilify . Again discussed will want to obtain metabolic monitoring labs for abilify  if patient has upcoming procedure and mom reports patient may need sedation for upcoming dental cleaning so informed her to let us  know so that we can coordinate obtaining labs. Patient has started working with behavioral support team.  The risks/benefits/side-effects/alternatives to this medication were discussed in detail with the patient and time was given for questions. The patient consents to medication trial.   Identifying Information: Gabrielle [name] is a 23 y.o. female with a history of non-verbal autism, skin excoriation disorder, trichotillomania, tic disorder, developmental delay and akathisia who is an established patient with [facility] for management of medications.  Risk Assessment: An assessment of suicide and violence risk factors was performed as part of this evaluation and is not significantly changed from the last visit.             While future psychiatric events cannot be accurately predicted, the patient does not currently require acute inpatient psychiatric care and does not currently meet [place]  involuntary commitment criteria.          Plan:  # Autism Spectrum Disorder # Developmental delay  Past medication trials: Celexa  (1 week trial, mom reports patient appeared to have trouble sleeping and a flatter affect), Lexapro, Luvox (failed), Zoloft, Focalin  (became more agitated and dangerous to self) ropinirole, BuSpar, gabapentin, Trileptal, Lamictal, clonidine , Vistaril, NAC, Valium (prior to procedures), Klonopin, Versed  (prior to procedures), Ativan, triazolam (prior to procedures) Status of problem: ongoing Interventions: -- Increase abilify  oral 7.5mL BID for agitation, irritability and impulsivity  Due for A1c and lipid panel, parent reports patient is due for dental cleaning and may be sedated during that time and can coordinate for labs.   -- Continue trazodone  75mg  at bedtime   # Excoriation disorder (skin picking) # Trichotillomania  # Stereotypic Movement Disorder Past medication trials: NAC Status of problem: ongoing Interventions: -- Continue prozac  10mg  for anxiety, skin picking and hair pulling -- Continue clonidine  0.2mg  AM, 0.2mg  afternoon, 0.1mg  at bedtime   -- Stop clonidine  patch for now due to ineffectiveness with warm weather  Return to care in  Future Appointments  Date Time Provider Department Center  12/18/2023  2:30 PM Graham Krabbe, MD [facility] None   Patient was given contact information for behavioral health clinic and was instructed to call 911 for emergencies.   Patient and plan of care will be discussed with the Attending MD ,Dr. Susen, who agrees with the above statement and plan.   Subjective:  Chief Complaint: No chief complaint on file.  Interval History:  --no interval notes or labs  Patient is seen pacing around the room. Mom and dad are both present. They confirm current medication regimen of abilify , clonidine  and prozac . Reports that patient is now only at the day program for a half day and they report that she has had increase in her aggressive behavior and in her intrusiveness towards parents and others, especially in the afternoon. They note that she has  not had any recent medical issues besides the bunion. They note that she has had regular bowel movements and has not had dysuria though  she had an episode of intentional urination. No issues with sleep. They report she has had increased appetite. She had recent dental visit with tartar and is on the schedule for sedation for deeper cleaning. Report that she has been working with behavioral specialist and the person that comes to help in the afternoon may be leaving for a new job. Later on mom reports that there can be inconsistent adherence to behavior plan in between the parents. We discussed what medications have worked well for patient and parents report benefit from abilify . Discussed that we could increase abilify  and parents were in agreement.   Visit Diagnosis:    ICD-10-CM   1. Developmental disability  F89     2. Autism  F84.0 Aripiprazole  1 MG/ML mL    3. Stereotyped movement disorder  F98.4     4. Long term current use of antipsychotic medication  Z79.899       Past Psychiatric History:  Diagnoses: stereotypic movement disorder, developmental disability, autism Medication trials:   Current: abilify  5mg  BID, clonidine  0.1mg  PM, clonidine  0.2 BID, prozac  10mg  daily  Past: Celexa  (1 week trial, mom reports patient appeared to have trouble sleeping and a flatter affect), Lexapro, Luvox (failed), Zoloft, Focalin (became more agitated and dangerous to self) ropinirole, BuSpar, gabapentin, Trileptal, Lamictal, clonidine , Vistaril, NAC, Valium (prior to procedures), Klonopin, Versed  (prior to procedures), Ativan, triazolam (prior to procedures)  Previous psychiatrist/therapist: Dr. Rainelle, Dr. Juleen Substance use: UDS, PDMP ativan 2mg  1# 08/20/2023  Past Medical History:  Past Medical History:  Diagnosis Date   Anemia    Anxiety    Autism disorder    per mother severe autism, very social,  good receptive languange but nonverbal (can do some sign languange) , high tolerence to pain   Dysmenorrhea    Menorrhagia    Nonverbal    PT AUTISTIC   PONV (postoperative nausea and vomiting)    per mother when wakes up pt  can go from 0 to 60 and starts taking off cords and try to sit up to stand, better when mother and father are they when she wakes up   Repetitive movement    due to autism---  chronic picking of own skin and body    Past Surgical History:  Procedure Laterality Date   DENTAL RESTORATION/EXTRACTION WITH X-RAY     DILATION AND CURETTAGE OF UTERUS N/A 04/07/2017   Procedure: DILATATION AND CURETTAGE;  Surgeon: Cleotilde Ronal RAMAN, MD;  [facility];  Service: Gynecology;  Laterality: N/A;   INTRAUTERINE DEVICE (IUD) INSERTION N/A 04/07/2017   Procedure: PLACEMENT OF KYLEENA   IUD;  Surgeon: Cleotilde Ronal RAMAN, MD;  Location: [facility];  Service: Gynecology;  Laterality: N/A;   INTRAUTERINE DEVICE (IUD) INSERTION N/A 04/23/2022   Procedure: INTRAUTERINE DEVICE (IUD) INSERTION;  Surgeon: Cleotilde Ronal RAMAN, MD;  Location: [facility];  Service: Gynecology;  Laterality: N/A;   IUD REMOVAL N/A 04/23/2022   Procedure: INTRAUTERINE DEVICE (IUD) REMOVAL;  Surgeon: Cleotilde Ronal RAMAN, MD;  Location: [facility];  Service: Gynecology;  Laterality: N/A;   MASS EXCISION Left 06/15/2015   Procedure: EXCISION OF LEFT AXILLARY MASS/TETANUS INJECTION;  Surgeon: Gabrielle RAMAN Fritter, DO;  Location: [facility];  Service: Plastics;  Laterality: Left;   OPERATIVE ULTRASOUND N/A 04/23/2022   Procedure: OPERATIVE  ULTRASOUND;  Surgeon: Cleotilde Ronal RAMAN, MD;  Location: [facility];  Service: Gynecology;  Laterality: N/A;   Contraception: IUD  Family Psychiatric History: Parents report some history of family members with depression    Family History:  Family History  Problem Relation Age of Onset   Heart disease Maternal Grandmother    Endometriosis Maternal Grandmother    Diabetes Maternal Grandfather    Cancer Maternal Grandfather        Skin Cancer   Asthma Mother    Hyperlipidemia Mother    Hypertension Paternal Grandfather     Hypertension Father    Hyperlipidemia Father    Diverticulosis Father    Arthritis Paternal Grandmother     Social History:  Academic/Vocational: Lives with both parents. Attends half day at the day program.   Substance Use History:   Social History   Socioeconomic History   Marital status: Single    Spouse name: Not on file   Number of children: Not on file   Years of education: Not on file   Highest education level: Not on file  Occupational History   Occupation: Medical illustrator in [place]  Tobacco Use   Smoking status: Never   Smokeless tobacco: Never  Vaping Use   Vaping status: Never Used  Substance and Sexual Activity   Alcohol use: Never   Drug use: Never   Sexual activity: Never    Birth control/protection: None  Other Topics Concern   Not on file  Social History Narrative   Patient lives with both parents. Gabrielle graduated from [employer]. She is now at [employer]       Behavior specialist consult with mom once a month.    Therapies: Not at the moment.    Social Drivers of Corporate investment banker Strain: Not on file  Food Insecurity: Not on file  Transportation Needs: Not on file  Physical Activity: Not on file  Stress: Not on file  Social Connections: Not on file    Allergies:  Allergies  Allergen Reactions   Cleaner [Ao-Sept Disinfection-Neutral] Hives    BRAND NAME - KABOOM    Current Medications: Current Outpatient Medications  Medication Sig Dispense Refill   Aripiprazole  1 MG/ML mL Take 7.5 mLs (7.5 mg total) by mouth 2 (two) times daily. Take oral solution OR tablet if unable to tolerate oral solution. 300 mL 2   cloNIDine  (CATAPRES ) 0.1 MG tablet Take 1 tablet (0.1 mg total) by mouth every evening. To be taken at 1800 30 tablet 2   cloNIDine  (CATAPRES ) 0.2 MG tablet Take 1 tablet (0.2 mg total) by mouth 2 (two) times daily. To be taken each morning and at 1400 60 tablet 2   FLUoxetine  (PROZAC ) 20 MG/5ML solution Take 2.5  mLs (10 mg total) by mouth daily. 75 mL 2   ibuprofen  (ADVIL ) 200 MG tablet Take 400 mg by mouth every 6 (six) hours as needed for fever, headache or mild pain.     KYLEENA  19.5 MG IUD 1 Device by Intrauterine route once for 1 dose. 1 Intra Uterine Device 0   Multiple Vitamin (MULTIVITAMIN) tablet Take 1 tablet by mouth daily.     mupirocin  ointment (BACTROBAN ) 2 % Apply 1 application topically 2 (two) times daily. (Patient taking differently: Apply 1 application  topically 2 (two) times daily as needed.) 22 g 0   norethindrone -ethinyl estradiol-FE (LOESTRIN FE) 1-20 MG-MCG tablet Take 1 tablet by mouth daily. Skip placebo pills and take  continuous active. (Patient not taking: Reported on 06/13/2023) 112 tablet 1   traZODone  (DESYREL ) 50 MG tablet Take 1.5 tablets (75 mg total) by mouth at bedtime. 45 tablet 2   No current facility-administered medications for this visit.    ROS: Review of Systems - unable to assess   Objective:  Psychiatric Specialty Exam: There were no vitals taken for this visit.There is no height or weight on file to calculate BMI.  General Appearance: Casual, wearing pink shirt and black overalls   Eye Contact:  Minimal  Speech:  Garbled  Volume:  Increased  Mood:  unable to assess  Affect:  Appropriate  Thought Content: unable to assess   Suicidal Thoughts:  unable to assess  Homicidal Thoughts:  unable to assess  Thought Process:  unable to assess  Orientation:  Other:  unable to assess    Memory: unable to assess  Judgment:  Impaired  Insight:  Lacking  Concentration:  Concentration: Poor  Recall: not formally assessed   Fund of Knowledge: Poor  Language: Poor  Psychomotor Activity:  Increased and Restlessness  Akathisia:  pacing around the room, not increased per mom  AIMS (if indicated): done. No stiffness or cogwheeling in extremities - 09/2023  Assets:  Housing Social Support Vocational/Educational  ADL's:  Intact  Cognition: WNL  Sleep:  Fair    PE: General: no acute distress  Pulm: no increased work of breathing on room air  Strength & Muscle Tone: within normal limits Neuro: no focal neurological deficits observed  Gait & Station: normal  Metabolic Disorder Labs: Lab Results  Component Value Date   HGBA1C 5.3 04/23/2022   MPG 105.41 04/23/2022   MPG 108.28 08/11/2019   Lab Results  Component Value Date   PROLACTIN 7.9 04/23/2022   Lab Results  Component Value Date   CHOL 181 04/23/2022   TRIG 68 04/23/2022   HDL 31 (L) 04/23/2022   CHOLHDL 5.8 04/23/2022   VLDL 14 04/23/2022   LDLCALC 136 (H) 04/23/2022   LDLCALC 102 (H) 08/11/2019   Lab Results  Component Value Date   TSH 2.564 04/23/2022   TSH 2.188 08/11/2019    Therapeutic Level Labs: No results found for: LITHIUM No results found for: VALPROATE No results found for: CBMZ  Screenings:  Flowsheet Row UC from 10/03/2020 in [facility])  C-SSRS RISK CATEGORY No Risk    Collaboration of Care: Collaboration of Care: Medication Management AEB Dr. Susen   Patient/Guardian was advised Release of Information must be obtained prior to any record release in order to collaborate their care with an outside provider. Patient/Guardian was advised if they have not already done so to contact the registration department to sign all necessary forms in order for us  to release information regarding their care.   Consent: Patient/Guardian gives verbal consent for treatment and assignment of benefits for services provided during this visit. Patient/Guardian expressed understanding and agreed to proceed.   Corean Minor, MD, PGY-3 11/20/2023, 5:31 PM

## 2023-11-20 ENCOUNTER — Ambulatory Visit (INDEPENDENT_AMBULATORY_CARE_PROVIDER_SITE_OTHER): Payer: MEDICAID | Admitting: Psychiatry

## 2023-11-20 DIAGNOSIS — F840 Autistic disorder: Secondary | ICD-10-CM

## 2023-11-20 DIAGNOSIS — Z79899 Other long term (current) drug therapy: Secondary | ICD-10-CM

## 2023-11-20 DIAGNOSIS — F984 Stereotyped movement disorders: Secondary | ICD-10-CM

## 2023-11-20 DIAGNOSIS — F89 Unspecified disorder of psychological development: Secondary | ICD-10-CM | POA: Diagnosis not present

## 2023-11-20 MED ORDER — ARIPIPRAZOLE 1 MG/ML PO SOLN: 7.5000 mg | Freq: Two times a day (BID) | ORAL | 2 refills | Status: DC

## 2023-12-08 NOTE — Progress Notes (Cosign Needed)
 BH MD Outpatient Progress Note  12/18/2023 5:11 PM Gabrielle [name]  MRN:  [id number]  Assessment:  Gabrielle [name] presents for follow-up evaluation. Today, 12/18/23, mom reports improvement in patient's impulsivity. She has been compliant with other medications. No new medical issues noted by parents.  There are no safety concerns noted by parent.  Parent has psychosocial stressors of organizing patient's behavioral services including with the daycare program and, caretakers in the afternoon, and the behavioral support services.  We discussed continuing medication regimen.    Identifying Information: Gabrielle [name] is a 23 y.o. female with a history of non-verbal autism, skin excoriation disorder, trichotillomania, tic disorder, developmental delay and akathisia who is an established patient with [facility] for management of medications.  Risk Assessment: An assessment of suicide and violence risk factors was performed as part of this evaluation and is not significantly changed from the last visit.             While future psychiatric events cannot be accurately predicted, the patient does not currently require acute inpatient psychiatric care and does not currently meet [place]  involuntary commitment criteria.          Plan:  # Autism Spectrum Disorder # Developmental delay  Past medication trials: Celexa  (1 week trial, mom reports patient appeared to have trouble sleeping and a flatter affect), Lexapro, Luvox (failed), Zoloft, Focalin (became more agitated and dangerous to self) ropinirole, BuSpar, gabapentin, Trileptal, Lamictal, clonidine , Vistaril, NAC, Valium (prior to procedures), Klonopin, Versed  (prior to procedures), Ativan, triazolam (prior to procedures) Status of problem: ongoing Interventions: -- Continue abilify  oral 7.5mL BID for agitation, irritability and impulsivity  Due for A1c and lipid panel, parent reports patient is due for dental cleaning  and may be sedated during that time and can coordinate for labs.   -- Continue trazodone  75mg  at bedtime   # Excoriation disorder (skin picking) # Trichotillomania  # Stereotypic Movement Disorder Past medication trials: NAC Status of problem: ongoing Interventions: -- Continue prozac  10mg  for anxiety, skin picking and hair pulling -- Continue clonidine  0.2mg  AM, 0.2mg  afternoon, 0.1mg  at bedtime   -- Stop clonidine  patch for now due to ineffectiveness with warm weather  Return to care in  Future Appointments  Date Time Provider Department Center  01/29/2024  8:00 AM Graham Krabbe, MD [facility] None   Patient was given contact information for behavioral health clinic and was instructed to call 911 for emergencies.   Patient and plan of care will be discussed with the Attending MD who agrees with the above statement and plan.   Subjective:  Chief Complaint: No chief complaint on file.  Interval History:  --no interval notes or labs  Patient is seen pacing around the room. Mom is present.  Mom reports that patient has had improvement in her behavior since the last visit.  She reports that she is doing better at the day program and has a new behavioral specialist there Leslee who hears better.  She is still frustrated with the documentation from the day program regarding patient's behavior.  She reports that patient's has slept well, she only had 1 incident of bedwetting but she reports that this is when patient had increased access to water.  She reports that patient has had increased appetite and she is unsure if this is due to the medication since patient has increased appetite at baseline.  She reports that patient has not had any hair pulling, reports the patient still has  intermittent skin picking.  She reports that her mood is overall better.  She reports patient has not had any episodes of harming herself or others.  She reports that patient is compliant with the medications and  she knows this due to patient not getting any medications at the day program because she is coming home in the afternoon.  She has not noticed any increased stiffness with patient's arms or legs.  Mom reports some frustration with the care at home and with the behavioral support services.  Mom reports that her goal is for patient to come off medications.  Asked mom if patient has had a period off medications in the past and mom reports that when patient was going through puberty that she had a lot of impulsive and aggressive behaviors so she was put on the Abilify  at that time. She is not sure if the Abilify , Prozac  or clonidine  is helping.  She wonders if patient is going through rapid cycling.  However, difficult to tell since patient is unable to give report of her mood.  Suspect that her behaviors are more consistent with autism and developmental delay.  We discussed no medication changes at this time.  She reports that she has not been able to implement consistent behavioral training with father given her stressors with work, having to take care of the patient half day, and the issues with patient's other behavioral organizations.  Mom also asked about leucovorin treatment.   Visit Diagnosis:    ICD-10-CM   1. Autism  F84.0 cloNIDine  (CATAPRES ) 0.1 MG tablet    cloNIDine  (CATAPRES ) 0.2 MG tablet    FLUoxetine  (PROZAC ) 20 MG/5ML solution    Aripiprazole  1 MG/ML mL    traZODone  (DESYREL ) 50 MG tablet      Past Psychiatric History:  Diagnoses: stereotypic movement disorder, developmental disability, autism Medication trials:   abilify  5mg  BID, clonidine  0.1mg  PM, clonidine  0.2 BID, prozac  10mg  daily  Past: Celexa  (1 week trial, mom reports patient appeared to have trouble sleeping and a flatter affect), Lexapro, Luvox (failed), Zoloft, Focalin (became more agitated and dangerous to self) ropinirole, BuSpar, gabapentin, Trileptal, Lamictal, clonidine , Vistaril, NAC, Valium (prior to procedures),  Klonopin, Versed  (prior to procedures), Ativan, triazolam (prior to procedures)  Previous psychiatrist/therapist: Dr. Rainelle, Dr. Juleen Substance use: UDS, PDMP ativan 2mg  1# 08/20/2023  Past Medical History:  Past Medical History:  Diagnosis Date   Anemia    Anxiety    Autism disorder    per mother severe autism, very social,  good receptive languange but nonverbal (can do some sign languange) , high tolerence to pain   Dysmenorrhea    Menorrhagia    Nonverbal    PT AUTISTIC   PONV (postoperative nausea and vomiting)    per mother when wakes up pt can go from 0 to 60 and starts taking off cords and try to sit up to stand, better when mother and father are they when she wakes up   Repetitive movement    due to autism---  chronic picking of own skin and body    Past Surgical History:  Procedure Laterality Date   DENTAL RESTORATION/EXTRACTION WITH X-RAY     DILATION AND CURETTAGE OF UTERUS N/A 04/07/2017   Procedure: DILATATION AND CURETTAGE;  Surgeon: Cleotilde Ronal RAMAN, MD;  Location[facility]  Service: Gynecology;  Laterality: N/A;   INTRAUTERINE DEVICE (IUD) INSERTION N/A 04/07/2017   Procedure: PLACEMENT OF KYLEENA   IUD;  Surgeon: Cleotilde Ronal RAMAN, MD;  Location: [facility];  Service: Gynecology;  Laterality: N/A;   INTRAUTERINE DEVICE (IUD) INSERTION N/A 04/23/2022   Procedure: INTRAUTERINE DEVICE (IUD) INSERTION;  Surgeon: Cleotilde Ronal RAMAN, MD;  Location: [facility];  Service: Gynecology;  Laterality: N/A;   IUD REMOVAL N/A 04/23/2022   Procedure: INTRAUTERINE DEVICE (IUD) REMOVAL;  Surgeon: Cleotilde Ronal RAMAN, MD;  Location: [facility];  Service: Gynecology;  Laterality: N/A;   MASS EXCISION Left 06/15/2015   Procedure: EXCISION OF LEFT AXILLARY MASS/TETANUS INJECTION;  Surgeon: Gabrielle RAMAN Fritter, DO;  Location: [facility];  Service: Plastics;  Laterality: Left;   OPERATIVE ULTRASOUND N/A 04/23/2022   Procedure:  OPERATIVE ULTRASOUND;  Surgeon: Cleotilde Ronal RAMAN, MD;  Location: [facility];  Service: Gynecology;  Laterality: N/A;   Contraception: IUD  Family Psychiatric History: Parents report some history of family members with depression    Family History:  Family History  Problem Relation Age of Onset   Heart disease Maternal Grandmother    Endometriosis Maternal Grandmother    Diabetes Maternal Grandfather    Cancer Maternal Grandfather        Skin Cancer   Asthma Mother    Hyperlipidemia Mother    Hypertension Paternal Grandfather    Hypertension Father    Hyperlipidemia Father    Diverticulosis Father    Arthritis Paternal Grandmother     Social History:  Academic/Vocational: Lives with both parents. Attends half day at the day program.   Substance Use History:   Social History   Socioeconomic History   Marital status: Single    Spouse name: Not on file   Number of children: Not on file   Years of education: Not on file   Highest education level: Not on file  Occupational History   Occupation: Medical illustrator in [place]  Tobacco Use   Smoking status: Never   Smokeless tobacco: Never  Vaping Use   Vaping status: Never Used  Substance and Sexual Activity   Alcohol use: Never   Drug use: Never   Sexual activity: Never    Birth control/protection: None  Other Topics Concern   Not on file  Social History Narrative   Patient lives with both parents. Gabrielle graduated from [employer]. She is now at [employer]       Behavior specialist consult with mom once a month.    Therapies: Not at the moment.    Social Drivers of Corporate investment banker Strain: Not on file  Food Insecurity: Not on file  Transportation Needs: Not on file  Physical Activity: Not on file  Stress: Not on file  Social Connections: Not on file    Allergies:  Allergies  Allergen Reactions   Cleaner [Ao-Sept Disinfection-Neutral] Hives    BRAND NAME - KABOOM     Current Medications: Current Outpatient Medications  Medication Sig Dispense Refill   Aripiprazole  1 MG/ML mL Take 7.5 mLs (7.5 mg total) by mouth 2 (two) times daily. Take oral solution OR tablet if unable to tolerate oral solution. 300 mL 2   cloNIDine  (CATAPRES ) 0.1 MG tablet Take 1 tablet (0.1 mg total) by mouth every evening. To be taken at 1800 30 tablet 2   cloNIDine  (CATAPRES ) 0.2 MG tablet Take 1 tablet (0.2 mg total) by mouth 2 (two) times daily. To be taken each morning and at 1400 60 tablet 2   FLUoxetine  (PROZAC ) 20 MG/5ML solution Take 2.5 mLs (10 mg total)  by mouth daily. 75 mL 2   ibuprofen  (ADVIL ) 200 MG tablet Take 400 mg by mouth every 6 (six) hours as needed for fever, headache or mild pain.     KYLEENA  19.5 MG IUD 1 Device by Intrauterine route once for 1 dose. 1 Intra Uterine Device 0   Multiple Vitamin (MULTIVITAMIN) tablet Take 1 tablet by mouth daily.     mupirocin  ointment (BACTROBAN ) 2 % Apply 1 application topically 2 (two) times daily. (Patient taking differently: Apply 1 application  topically 2 (two) times daily as needed.) 22 g 0   norethindrone -ethinyl estradiol-FE (LOESTRIN FE) 1-20 MG-MCG tablet Take 1 tablet by mouth daily. Skip placebo pills and take continuous active. (Patient not taking: Reported on 06/13/2023) 112 tablet 1   traZODone  (DESYREL ) 50 MG tablet Take 1.5 tablets (75 mg total) by mouth at bedtime. 45 tablet 2   No current facility-administered medications for this visit.    ROS: Review of Systems - unable to assess   Objective:  Psychiatric Specialty Exam: There were no vitals taken for this visit.There is no height or weight on file to calculate BMI.  General Appearance: Casual  Eye Contact:  Minimal  Speech:  Garbled  Volume:  Increased  Mood:  unable to assess  Affect:  Appropriate  Thought Content: unable to assess   Suicidal Thoughts:  unable to assess  Homicidal Thoughts:  unable to assess  Thought Process:  unable to  assess  Orientation:  Other:  unable to assess    Memory: unable to assess  Judgment:  Impaired  Insight:  Lacking  Concentration:  Concentration: Poor  Recall: not formally assessed   Fund of Knowledge: Poor  Language: Poor  Psychomotor Activity:  Increased and Restlessness  Akathisia:  pacing around the room  AIMS (if indicated): done. No stiffness or cogwheeling in extremities - 09/2023  Assets:  Housing Social Support Vocational/Educational  ADL's:  Intact  Cognition: WNL  Sleep:  Fair   PE: General: no acute distress  Pulm: no increased work of breathing on room air  Strength & Muscle Tone: within normal limits Neuro: no focal neurological deficits observed  Gait & Station: normal  Metabolic Disorder Labs: Lab Results  Component Value Date   HGBA1C 5.3 04/23/2022   MPG 105.41 04/23/2022   MPG 108.28 08/11/2019   Lab Results  Component Value Date   PROLACTIN 7.9 04/23/2022   Lab Results  Component Value Date   CHOL 181 04/23/2022   TRIG 68 04/23/2022   HDL 31 (L) 04/23/2022   CHOLHDL 5.8 04/23/2022   VLDL 14 04/23/2022   LDLCALC 136 (H) 04/23/2022   LDLCALC 102 (H) 08/11/2019   Lab Results  Component Value Date   TSH 2.564 04/23/2022   TSH 2.188 08/11/2019    Therapeutic Level Labs: No results found for: LITHIUM No results found for: VALPROATE No results found for: CBMZ  Screenings:  Flowsheet Row UC from 10/03/2020 in [facility])  C-SSRS RISK CATEGORY No Risk    Collaboration of Care: Collaboration of Care: Medication Management AEB attending MD  Patient/Guardian was advised Release of Information must be obtained prior to any record release in order to collaborate their care with an outside provider. Patient/Guardian was advised if they have not already done so to contact the registration department to sign all necessary forms in order for us  to release information regarding their care.   Consent:  Patient/Guardian gives verbal consent for treatment and assignment  of benefits for services provided during this visit. Patient/Guardian expressed understanding and agreed to proceed.   Corean Minor, MD, PGY-3 12/18/2023, 5:11 PM

## 2023-12-18 ENCOUNTER — Ambulatory Visit (INDEPENDENT_AMBULATORY_CARE_PROVIDER_SITE_OTHER): Payer: MEDICAID | Admitting: Psychiatry

## 2023-12-18 DIAGNOSIS — F984 Stereotyped movement disorders: Secondary | ICD-10-CM

## 2023-12-18 DIAGNOSIS — F840 Autistic disorder: Secondary | ICD-10-CM | POA: Diagnosis not present

## 2023-12-18 DIAGNOSIS — Z79899 Other long term (current) drug therapy: Secondary | ICD-10-CM

## 2023-12-18 DIAGNOSIS — F424 Excoriation (skin-picking) disorder: Secondary | ICD-10-CM

## 2023-12-18 DIAGNOSIS — F89 Unspecified disorder of psychological development: Secondary | ICD-10-CM | POA: Diagnosis not present

## 2023-12-18 MED ORDER — CLONIDINE HCL 0.2 MG PO TABS: 0.2000 mg | ORAL_TABLET | Freq: Two times a day (BID) | ORAL | 2 refills | Status: DC

## 2023-12-18 MED ORDER — CLONIDINE HCL 0.1 MG PO TABS: 0.1000 mg | ORAL_TABLET | Freq: Every evening | ORAL | 2 refills | Status: DC

## 2023-12-18 MED ORDER — FLUOXETINE HCL 20 MG/5ML PO SOLN: 10.0000 mg | Freq: Every day | ORAL | 2 refills | Status: DC

## 2023-12-18 MED ORDER — ARIPIPRAZOLE 1 MG/ML PO SOLN: 7.5000 mg | Freq: Two times a day (BID) | ORAL | 2 refills | Status: DC

## 2023-12-18 MED ORDER — TRAZODONE HCL 50 MG PO TABS: 75.0000 mg | ORAL_TABLET | Freq: Every day | ORAL | 2 refills | Status: DC

## 2024-01-26 NOTE — Progress Notes (Signed)
 BH MD Outpatient Progress Note  01/29/2024 9:56 AM Gabrielle [name]  MRN:  [id number]  Assessment:  Gabrielle [name] presents for follow-up evaluation. Today, 01/29/24, mom reports that patient had possible UTI and kidney stones contributing to her worsened behavior in the summer which was noted when patient expressed increased pain in October (becoming more tearful) and started pulling at her pants and had a urinanalysis positive for calcium oxalate. She has been compliant with medications. Improvement in behavior noted after patient completed course of antibiotics and azo. Overall mom has noted improvement in her behaviors and they are coordinating care with the urologist regarding potential kidney stones, unfortunately difficult to confirm with imaging since patient unable to stay still. There are no safety concerns noted by parent.  Parent has psychosocial stressors of organizing patient's behavioral services including with the daycare program and caretakers in the afternoon.  We discussed continuing medication regimen.    Identifying Information: Gabrielle [name] is a 23 y.o. female with a history of non-verbal autism, skin excoriation disorder, trichotillomania, tic disorder, developmental delay and akathisia who is an established patient with [facility] for management of medications.  Risk Assessment: An assessment of suicide and violence risk factors was performed as part of this evaluation and is not significantly changed from the last visit.             While future psychiatric events cannot be accurately predicted, the patient does not currently require acute inpatient psychiatric care and does not currently meet [facility]  involuntary commitment criteria.          Plan:  # Autism Spectrum Disorder # Developmental delay  Past medication trials: Celexa  (1 week trial, mom reports patient appeared to have trouble sleeping and a flatter affect), Lexapro, Luvox (failed),  Zoloft, Focalin (became more agitated and dangerous to self) ropinirole, BuSpar, gabapentin, Trileptal, Lamictal, clonidine , Vistaril, NAC, Valium (prior to procedures), Klonopin, Versed  (prior to procedures), Ativan, triazolam (prior to procedures) Status of problem: ongoing Interventions: -- Continue abilify  oral 7.5mL BID for agitation, irritability and impulsivity  Due for A1c and lipid panel, parent reports patient is due for dental cleaning and may be sedated during that time and can coordinate for labs.   -- Continue trazodone  75mg  at bedtime   # Excoriation disorder (skin picking) # Trichotillomania  # Stereotypic Movement Disorder Past medication trials: NAC Status of problem: ongoing Interventions: -- Continue prozac  10mg  for anxiety, skin picking and hair pulling -- Continue clonidine  0.2mg  AM, 0.2mg  afternoon, 0.1mg  at bedtime    Return to care in  Future Appointments  Date Time Provider Department Center  04/01/2024  1:00 PM Graham Krabbe, MD [facility] None   Patient was given contact information for behavioral health clinic and was instructed to call 911 for emergencies.   Patient and plan of care will be discussed with the Attending MD who agrees with the above statement and plan.   Subjective:  Chief Complaint: No chief complaint on file.  Interval History:  --no interval notes or labs --last visit: continue abilify  oral 7.5mg  BID, continue prozac  10mg , continue clonidine  0.2, 0.2, 0.1  Patient is seen pacing around the room with intermittent stereotyped movements. Mom is present. Mom reports again improvement in patient's behavior. She reports that in beginning of October after our appointment she started noticing that patient was more tearful and started pulling at her pants. She reports they went to her PCP and they saw calcium oxalate in the urine. She reports  they prescribed an antibiotic and azo. She reports that patient improved after antibiotic and with the  azo. She reports that patient has been following up with urologist and there is concern for kidney stones though patient is unable to stay still for an ultrasound. She reports overall improvement in patient's behavior, reports patient is able to concentrate better, riding in the car, and smiling. She reports no issues with sleep or appetite. She reports urination improved after course of abx. She repots there was a period of increased hair pulling but that has since decreased. She reports patient continues with day program in the morning and then she has behavioral help in the afternoon. Shared decision making for no medication changes at this time.    Visit Diagnosis:    ICD-10-CM   1. Autism  F84.0 Aripiprazole  1 MG/ML mL    cloNIDine  (CATAPRES ) 0.1 MG tablet    cloNIDine  (CATAPRES ) 0.2 MG tablet    FLUoxetine  (PROZAC ) 20 MG/5ML solution    traZODone  (DESYREL ) 50 MG tablet    2. Developmental disability  F89     3. Long term current use of antipsychotic medication  Z79.899     4. Stereotyped movement disorder  F98.4     5. Compulsive skin picking  F42.4        Past Psychiatric History:  Diagnoses: stereotypic movement disorder, developmental disability, autism Medication trials:   abilify  5mg  BID, clonidine  0.1mg  PM, clonidine  0.2 BID, prozac  10mg  daily  Past: Celexa  (1 week trial, mom reports patient appeared to have trouble sleeping and a flatter affect), Lexapro, Luvox (failed), Zoloft, Focalin (became more agitated and dangerous to self) ropinirole, BuSpar, gabapentin, Trileptal, Lamictal, clonidine , Vistaril, NAC, Valium (prior to procedures), Klonopin, Versed  (prior to procedures), Ativan, triazolam (prior to procedures)  Previous psychiatrist/therapist: Dr. Rainelle, Dr. Juleen Substance use: UDS, PDMP ativan 2mg  1# 08/20/2023  Past Medical History:  Past Medical History:  Diagnosis Date   Anemia    Anxiety    Autism disorder    per mother severe autism, very social,  good  receptive languange but nonverbal (can do some sign languange) , high tolerence to pain   Dysmenorrhea    Menorrhagia    Nonverbal    PT AUTISTIC   PONV (postoperative nausea and vomiting)    per mother when wakes up pt can go from 0 to 60 and starts taking off cords and try to sit up to stand, better when mother and father are they when she wakes up   Repetitive movement    due to autism---  chronic picking of own skin and body    Past Surgical History:  Procedure Laterality Date   DENTAL RESTORATION/EXTRACTION WITH X-RAY     DILATION AND CURETTAGE OF UTERUS N/A 04/07/2017   Procedure: DILATATION AND CURETTAGE;  Surgeon: Cleotilde Ronal RAMAN, MD;  Location: [facility];  Service: Gynecology;  Laterality: N/A;   INTRAUTERINE DEVICE (IUD) INSERTION N/A 04/07/2017   Procedure: PLACEMENT OF KYLEENA   IUD;  Surgeon: Cleotilde Ronal RAMAN, MD;  Location: [facility];  Service: Gynecology;  Laterality: N/A;   INTRAUTERINE DEVICE (IUD) INSERTION N/A 04/23/2022   Procedure: INTRAUTERINE DEVICE (IUD) INSERTION;  Surgeon: Cleotilde Ronal RAMAN, MD;  Location: [facility];  Service: Gynecology;  Laterality: N/A;   IUD REMOVAL N/A 04/23/2022   Procedure: INTRAUTERINE DEVICE (IUD) REMOVAL;  Surgeon: Cleotilde Ronal RAMAN, MD;  Location: [facility];  Service: Gynecology;  Laterality: N/A;   MASS EXCISION  Left 06/15/2015   Procedure: EXCISION OF LEFT AXILLARY MASS/TETANUS INJECTION;  Surgeon: Gabrielle GORMAN Fritter, DO;  Location: [facility];  Service: Plastics;  Laterality: Left;   OPERATIVE ULTRASOUND N/A 04/23/2022   Procedure: OPERATIVE ULTRASOUND;  Surgeon: Cleotilde Ronal GORMAN, MD;  Location: [facility];  Service: Gynecology;  Laterality: N/A;   Contraception: IUD  Family Psychiatric History: Parents report some history of family members with depression    Family History:  Family History  Problem Relation Age of Onset   Heart disease Maternal  Grandmother    Endometriosis Maternal Grandmother    Diabetes Maternal Grandfather    Cancer Maternal Grandfather        Skin Cancer   Asthma Mother    Hyperlipidemia Mother    Hypertension Paternal Grandfather    Hypertension Father    Hyperlipidemia Father    Diverticulosis Father    Arthritis Paternal Grandmother     Social History:  Academic/Vocational: Lives with both parents. Attends half day at the day program.   Substance Use History:   Social History   Socioeconomic History   Marital status: Single    Spouse name: Not on file   Number of children: Not on file   Years of education: Not on file   Highest education level: Not on file  Occupational History   Occupation: Medical Illustrator in [place]  Tobacco Use   Smoking status: Never   Smokeless tobacco: Never  Vaping Use   Vaping status: Never Used  Substance and Sexual Activity   Alcohol use: Never   Drug use: Never   Sexual activity: Never    Birth control/protection: None  Other Topics Concern   Not on file  Social History Narrative   Patient lives with both parents. Gabrielle graduated from [employer]. She is now at [employer]       Behavior specialist consult with mom once a month.    Therapies: Not at the moment.    Social Drivers of Corporate Investment Banker Strain: Not on file  Food Insecurity: Not on file  Transportation Needs: Not on file  Physical Activity: Not on file  Stress: Not on file  Social Connections: Not on file    Allergies:  Allergies  Allergen Reactions   Cleaner [Ao-Sept Disinfection-Neutral] Hives    BRAND NAME - KABOOM    Current Medications: Current Outpatient Medications  Medication Sig Dispense Refill   Aripiprazole  1 MG/ML mL Take 7.5 mLs (7.5 mg total) by mouth 2 (two) times daily. Take oral solution OR tablet if unable to tolerate oral solution. 300 mL 2   cloNIDine  (CATAPRES ) 0.1 MG tablet Take 1 tablet (0.1 mg total) by mouth every evening. To be  taken at 1800 30 tablet 2   cloNIDine  (CATAPRES ) 0.2 MG tablet Take 1 tablet (0.2 mg total) by mouth 2 (two) times daily. To be taken each morning and at 1400 60 tablet 2   FLUoxetine  (PROZAC ) 20 MG/5ML solution Take 2.5 mLs (10 mg total) by mouth daily. 75 mL 2   ibuprofen  (ADVIL ) 200 MG tablet Take 400 mg by mouth every 6 (six) hours as needed for fever, headache or mild pain.     KYLEENA  19.5 MG IUD 1 Device by Intrauterine route once for 1 dose. 1 Intra Uterine Device 0   Multiple Vitamin (MULTIVITAMIN) tablet Take 1 tablet by mouth daily.     mupirocin  ointment (BACTROBAN ) 2 % Apply 1 application topically 2 (two)  times daily. (Patient taking differently: Apply 1 application  topically 2 (two) times daily as needed.) 22 g 0   norethindrone -ethinyl estradiol-FE (LOESTRIN FE) 1-20 MG-MCG tablet Take 1 tablet by mouth daily. Skip placebo pills and take continuous active. (Patient not taking: Reported on 06/13/2023) 112 tablet 1   traZODone  (DESYREL ) 50 MG tablet Take 1.5 tablets (75 mg total) by mouth at bedtime. 45 tablet 2   No current facility-administered medications for this visit.    ROS: Review of Systems - unable to assess   Objective:  Psychiatric Specialty Exam: There were no vitals taken for this visit.There is no height or weight on file to calculate BMI.  General Appearance: Casual  Eye Contact:  Minimal  Speech:  Garbled  Volume:  Increased  Mood:  unable to assess  Affect:  Appropriate  Thought Content: unable to assess   Suicidal Thoughts:  unable to assess  Homicidal Thoughts:  unable to assess  Thought Process:  unable to assess  Orientation:  Other:  unable to assess    Memory: unable to assess  Judgment:  Impaired  Insight:  Lacking  Concentration:  Concentration: Poor  Recall: not formally assessed   Fund of Knowledge: Poor  Language: Poor  Psychomotor Activity:  Increased and Restlessness, however no opening the door or needing snacks   Akathisia:   pacing around the room  AIMS (if indicated): done. No stiffness or cogwheeling in extremities - 09/2023  Assets:  Housing Social Support Vocational/Educational  ADL's:  Intact  Cognition: WNL  Sleep:  Fair   PE: General: no acute distress  Pulm: no increased work of breathing on room air  Strength & Muscle Tone: within normal limits Neuro: no focal neurological deficits observed  Gait & Station: normal  Metabolic Disorder Labs: Lab Results  Component Value Date   HGBA1C 5.3 04/23/2022   MPG 105.41 04/23/2022   MPG 108.28 08/11/2019   Lab Results  Component Value Date   PROLACTIN 7.9 04/23/2022   Lab Results  Component Value Date   CHOL 181 04/23/2022   TRIG 68 04/23/2022   HDL 31 (L) 04/23/2022   CHOLHDL 5.8 04/23/2022   VLDL 14 04/23/2022   LDLCALC 136 (H) 04/23/2022   LDLCALC 102 (H) 08/11/2019   Lab Results  Component Value Date   TSH 2.564 04/23/2022   TSH 2.188 08/11/2019    Therapeutic Level Labs: No results found for: LITHIUM No results found for: VALPROATE No results found for: CBMZ  Screenings:  Flowsheet Row UC from 10/03/2020 in [facility])  C-SSRS RISK CATEGORY No Risk    Collaboration of Care: Collaboration of Care: Medication Management AEB attending MD  Patient/Guardian was advised Release of Information must be obtained prior to any record release in order to collaborate their care with an outside provider. Patient/Guardian was advised if they have not already done so to contact the registration department to sign all necessary forms in order for us  to release information regarding their care.   Consent: Patient/Guardian gives verbal consent for treatment and assignment of benefits for services provided during this visit. Patient/Guardian expressed understanding and agreed to proceed.   Corean Minor, MD, PGY-3 01/29/2024, 9:56 AM

## 2024-01-29 ENCOUNTER — Ambulatory Visit (INDEPENDENT_AMBULATORY_CARE_PROVIDER_SITE_OTHER): Payer: MEDICAID | Admitting: Psychiatry

## 2024-01-29 DIAGNOSIS — F840 Autistic disorder: Secondary | ICD-10-CM | POA: Diagnosis not present

## 2024-01-29 DIAGNOSIS — F424 Excoriation (skin-picking) disorder: Secondary | ICD-10-CM

## 2024-01-29 DIAGNOSIS — Z79899 Other long term (current) drug therapy: Secondary | ICD-10-CM | POA: Diagnosis not present

## 2024-01-29 DIAGNOSIS — F984 Stereotyped movement disorders: Secondary | ICD-10-CM

## 2024-01-29 DIAGNOSIS — F89 Unspecified disorder of psychological development: Secondary | ICD-10-CM | POA: Diagnosis not present

## 2024-01-29 MED ORDER — ARIPIPRAZOLE 1 MG/ML PO SOLN: 7.5000 mg | Freq: Two times a day (BID) | ORAL | 2 refills | Status: DC

## 2024-01-29 MED ORDER — CLONIDINE HCL 0.1 MG PO TABS: 0.1000 mg | ORAL_TABLET | Freq: Every evening | ORAL | 2 refills | Status: DC

## 2024-01-29 MED ORDER — TRAZODONE HCL 50 MG PO TABS: 75.0000 mg | ORAL_TABLET | Freq: Every day | ORAL | 2 refills | Status: DC

## 2024-01-29 MED ORDER — CLONIDINE HCL 0.2 MG PO TABS: 0.2000 mg | ORAL_TABLET | Freq: Two times a day (BID) | ORAL | 2 refills | Status: DC

## 2024-01-29 MED ORDER — FLUOXETINE HCL 20 MG/5ML PO SOLN: 10.0000 mg | Freq: Every day | ORAL | 2 refills | Status: DC

## 2024-02-08 ENCOUNTER — Encounter (HOSPITAL_COMMUNITY): Payer: Self-pay

## 2024-02-08 ENCOUNTER — Emergency Department (HOSPITAL_COMMUNITY)
Admission: EM | Admit: 2024-02-08 | Discharge: 2024-02-08 | Disposition: A | Discharge status: Home / Self Care | Payer: MEDICAID | Attending: Emergency Medicine | Admitting: Emergency Medicine

## 2024-02-08 ENCOUNTER — Other Ambulatory Visit: Payer: Self-pay

## 2024-02-08 ENCOUNTER — Emergency Department (HOSPITAL_COMMUNITY): Payer: MEDICAID

## 2024-02-08 DIAGNOSIS — N83202 Unspecified ovarian cyst, left side: Secondary | ICD-10-CM | POA: Insufficient documentation

## 2024-02-08 DIAGNOSIS — R1030 Lower abdominal pain, unspecified: Secondary | ICD-10-CM | POA: Diagnosis present

## 2024-02-08 LAB — I-STAT CHEM 8, ED
BUN: 17 mg/dL (ref 6–20)
Calcium, Ion: 1.18 mmol/L (ref 1.15–1.40)
Chloride: 106 mmol/L (ref 98–111)
Creatinine, Ser: 0.6 mg/dL (ref 0.44–1.00)
Glucose, Bld: 87 mg/dL (ref 70–99)
HCT: 37 % (ref 36.0–46.0)
Hemoglobin: 12.6 g/dL (ref 12.0–15.0)
Potassium: 3.9 mmol/L (ref 3.5–5.1)
Sodium: 140 mmol/L (ref 135–145)
TCO2: 24 mmol/L (ref 22–32)

## 2024-02-08 LAB — URINALYSIS, ROUTINE W REFLEX MICROSCOPIC
Bacteria, UA: NONE SEEN
Bilirubin Urine: NEGATIVE
Glucose, UA: NEGATIVE mg/dL
Hgb urine dipstick: NEGATIVE
Ketones, ur: NEGATIVE mg/dL
Leukocytes,Ua: NEGATIVE
Nitrite: POSITIVE — AB
Protein, ur: NEGATIVE mg/dL
Specific Gravity, Urine: 1.029 (ref 1.005–1.030)
pH: 5 (ref 5.0–8.0)

## 2024-02-08 LAB — CBC WITH DIFFERENTIAL/PLATELET
Abs Immature Granulocytes: 0.01 K/uL (ref 0.00–0.07)
Basophils Absolute: 0 K/uL (ref 0.0–0.1)
Basophils Relative: 0 %
Eosinophils Absolute: 0.1 K/uL (ref 0.0–0.5)
Eosinophils Relative: 1 %
HCT: 36.9 % (ref 36.0–46.0)
Hemoglobin: 12 g/dL (ref 12.0–15.0)
Immature Granulocytes: 0 %
Lymphocytes Relative: 31 %
Lymphs Abs: 2.6 K/uL (ref 0.7–4.0)
MCH: 31 pg (ref 26.0–34.0)
MCHC: 32.5 g/dL (ref 30.0–36.0)
MCV: 95.3 fL (ref 80.0–100.0)
Monocytes Absolute: 0.7 K/uL (ref 0.1–1.0)
Monocytes Relative: 8 %
Neutro Abs: 5.1 K/uL (ref 1.7–7.7)
Neutrophils Relative %: 60 %
Platelets: 254 K/uL (ref 150–400)
RBC: 3.87 MIL/uL (ref 3.87–5.11)
RDW: 12 % (ref 11.5–15.5)
WBC: 8.6 K/uL (ref 4.0–10.5)
nRBC: 0 % (ref 0.0–0.2)

## 2024-02-08 LAB — COMPREHENSIVE METABOLIC PANEL WITH GFR
ALT: 15 U/L (ref 0–44)
AST: 31 U/L (ref 15–41)
Albumin: 4.5 g/dL (ref 3.5–5.0)
Alkaline Phosphatase: 64 U/L (ref 38–126)
Anion gap: 10 (ref 5–15)
BUN: 16 mg/dL (ref 6–20)
CO2: 24 mmol/L (ref 22–32)
Calcium: 9.2 mg/dL (ref 8.9–10.3)
Chloride: 106 mmol/L (ref 98–111)
Creatinine, Ser: 0.59 mg/dL (ref 0.44–1.00)
GFR, Estimated: >60 mL/min (ref >60)
Glucose, Bld: 87 mg/dL (ref 70–99)
Potassium: 3.9 mmol/L (ref 3.5–5.1)
Sodium: 139 mmol/L (ref 135–145)
Total Bilirubin: 0.4 mg/dL (ref 0.0–1.2)
Total Protein: 7.4 g/dL (ref 6.5–8.1)

## 2024-02-08 LAB — LIPASE, BLOOD: Lipase: 46 U/L (ref 11–51)

## 2024-02-08 LAB — HCG, SERUM, QUALITATIVE: Preg, Serum: NEGATIVE

## 2024-02-08 MED ORDER — MORPHINE SULFATE (PF) 4 MG/ML IV SOLN
4.0000 mg | Freq: Once | INTRAVENOUS | Status: AC
  Administered 2024-02-08: 4 mg via INTRAVENOUS
  Filled 2024-02-08 (×2): qty 1

## 2024-02-08 MED ORDER — LORAZEPAM 2 MG/ML IJ SOLN
2.0000 mg | Freq: Once | INTRAMUSCULAR | Status: AC
  Administered 2024-02-08: 2 mg via INTRAMUSCULAR
  Filled 2024-02-08: qty 1

## 2024-02-08 MED ORDER — HALOPERIDOL LACTATE 5 MG/ML IJ SOLN
5.0000 mg | Freq: Once | INTRAMUSCULAR | Status: AC
  Administered 2024-02-08: 5 mg via INTRAMUSCULAR
  Filled 2024-02-08: qty 1

## 2024-02-08 MED ORDER — IOHEXOL 300 MG/ML  SOLN
100.0000 mL | Freq: Once | INTRAMUSCULAR | Status: AC | PRN
  Administered 2024-02-08: 100 mL via INTRAVENOUS

## 2024-02-08 MED ORDER — ONDANSETRON HCL 4 MG/2ML IJ SOLN
4.0000 mg | Freq: Once | INTRAMUSCULAR | Status: DC
Start: 2024-02-08 — End: 2024-02-09
  Filled 2024-02-08: qty 2

## 2024-02-08 MED ORDER — KETAMINE HCL 50 MG/ML IJ SOLN: 4.0000 mg/kg | Freq: Once | INTRAMUSCULAR | Status: DC

## 2024-02-08 MED ORDER — KETAMINE HCL 50 MG/ML IJ SOLN
2.0000 mg/kg | Freq: Once | INTRAMUSCULAR | Status: AC
  Administered 2024-02-08: 165 mg via INTRAMUSCULAR
  Filled 2024-02-08: qty 10

## 2024-02-08 MED ORDER — ONDANSETRON HCL 4 MG/2ML IJ SOLN
4.0000 mg | Freq: Once | INTRAMUSCULAR | Status: AC
  Administered 2024-02-08: 4 mg via INTRAVENOUS
  Filled 2024-02-08 (×2): qty 2

## 2024-02-08 NOTE — ED Triage Notes (Signed)
 PT arrives via POV with parents. PT is nonverbal at baseline. Mother reports she has been treated for a  uti, there were concerns for a possible kidney stone. Pt did complete 3 days of antibiotics without relief.  Mother states patient has been aggressive and very agitated lately, which is not her normal.

## 2024-02-08 NOTE — Discharge Instructions (Signed)
 The blood test and CT scan today did not show any signs of acute infection or serious abnormality.  It did show a cyst in the left ovary.  I suspect that could be contributing to her symptoms.  Take ibuprofen  or naproxen to help with the discomfort associated with the ovarian cyst.  Follow-up with her doctor next week to be rechecked

## 2024-02-08 NOTE — ED Provider Notes (Signed)
 [place] EMERGENCY DEPARTMENT AT [facility] Provider Note   CSN: [id number] Arrival date & time: 02/08/24  1536     Patient presents with: Agitation and Urinary Symptoms   Gabrielle [name] is a 23 y.o. female.   HPI   Patient has a history of autism developmental leg polypharmacy nausea.  Patient who is here with her parents.  Patient has a history of having some recurrent issues with UTIs.  However they have had several Uriline samples tested and they have not clearly been infectious.  There were also some crystals raising the concern for possible kidney stone.  She seen a urologist however is not had any imaging.  In the last couple of days she has been very agitated.  This is typically her behavior when she is in pain.  She has been pacing a lot she has been grabbing her lower abdomen.  No fevers.  No vomiting.  Mom and dad state that we will not be able to get any evaluation done without sedating her.  Prior to Admission medications   Medication Sig Start Date End Date Taking? Authorizing Provider  Aripiprazole  1 MG/ML mL Take 7.5 mLs (7.5 mg total) by mouth 2 (two) times daily. Take oral solution OR tablet if unable to tolerate oral solution. 01/29/24 04/28/24  Chien, Stephanie, MD  cloNIDine  (CATAPRES ) 0.1 MG tablet Take 1 tablet (0.1 mg total) by mouth every evening. To be taken at 1800 01/29/24 04/28/24  Chien, Stephanie, MD  cloNIDine  (CATAPRES ) 0.2 MG tablet Take 1 tablet (0.2 mg total) by mouth 2 (two) times daily. To be taken each morning and at 1400 01/29/24 04/28/24  Chien, Stephanie, MD  FLUoxetine  (PROZAC ) 20 MG/5ML solution Take 2.5 mLs (10 mg total) by mouth daily. 01/29/24 04/28/24  Chien, Stephanie, MD  ibuprofen  (ADVIL ) 200 MG tablet Take 400 mg by mouth every 6 (six) hours as needed for fever, headache or mild pain.    [provider]  KYLEENA  19.5 MG IUD 1 Device by Intrauterine route once for 1 dose. 04/07/17 04/18/22  Cleotilde Ronal RAMAN, MD  Multiple  Vitamin (MULTIVITAMIN) tablet Take 1 tablet by mouth daily.    [provider]  mupirocin  ointment (BACTROBAN ) 2 % Apply 1 application topically 2 (two) times daily. Patient taking differently: Apply 1 application  topically 2 (two) times daily as needed. 10/03/20   Van Knee, MD  norethindrone -ethinyl estradiol-FE (LOESTRIN FE) 1-20 MG-MCG tablet Take 1 tablet by mouth daily. Skip placebo pills and take continuous active. Patient not taking: Reported on 06/13/2023 03/20/23   Cleotilde Ronal RAMAN, MD  traZODone  (DESYREL ) 50 MG tablet Take 1.5 tablets (75 mg total) by mouth at bedtime. 01/29/24 04/28/24  Chien, Stephanie, MD    Allergies: Cleaner [ao-sept disinfection-neutral]    Review of Systems  Updated Vital Signs BP 113/75 (BP Location: Left Arm)   Pulse 79   Temp 98.2 F (36.8 C) (Oral)   Resp 18   Wt 81.6 kg   SpO2 99%   BMI 29.05 kg/m   Physical Exam Vitals and nursing note reviewed.  Constitutional:      General: She is not in acute distress.    Appearance: She is well-developed.  HENT:     Head: Normocephalic and atraumatic.     Right Ear: External ear normal.     Left Ear: External ear normal.  Eyes:     General: No scleral icterus.       Right eye: No discharge.  Left eye: No discharge.     Conjunctiva/sclera: Conjunctivae normal.  Neck:     Trachea: No tracheal deviation.  Cardiovascular:     Rate and Rhythm: Normal rate.  Pulmonary:     Effort: Pulmonary effort is normal. No respiratory distress.     Breath sounds: No stridor.  Abdominal:     General: There is no distension.  Musculoskeletal:        General: No swelling or deformity.     Cervical back: Neck supple.  Skin:    General: Skin is warm and dry.     Findings: No rash.  Neurological:     Mental Status: She is alert. Mental status is at baseline.     Cranial Nerves: No dysarthria or facial asymmetry.     Motor: No seizure activity.     (all labs ordered are listed, but only  abnormal results are displayed) Labs Reviewed  COMPREHENSIVE METABOLIC PANEL WITH GFR  LIPASE, BLOOD  CBC WITH DIFFERENTIAL/PLATELET  HCG, SERUM, QUALITATIVE  URINALYSIS, ROUTINE W REFLEX MICROSCOPIC  I-STAT CHEM 8, ED    EKG: None  Radiology: CT ABDOMEN PELVIS W CONTRAST Result Date: 02/08/2024 EXAM: CT ABDOMEN AND PELVIS WITH CONTRAST 02/08/2024 07:33:27 PM TECHNIQUE: CT of the abdomen and pelvis was performed with the administration of 100 mL of iohexol  (OMNIPAQUE ) 300 MG/ML solution. Multiplanar reformatted images are provided for review. Automated exposure control, iterative reconstruction, and/or weight-based adjustment of the mA/kV was utilized to reduce the radiation dose to as low as reasonably achievable. COMPARISON: Ultrasound pelvis 04/23/2022. CLINICAL HISTORY: Abdominal pain, acute, nonlocalized. FINDINGS: LOWER CHEST: Mild dependent atelectasis in the lung bases. LIVER: The liver is unremarkable. GALLBLADDER AND BILE DUCTS: Gallbladder is unremarkable. No biliary ductal dilatation. SPLEEN: No acute abnormality. PANCREAS: No acute abnormality. ADRENAL GLANDS: No acute abnormality. KIDNEYS, URETERS AND BLADDER: No stones in the kidneys or ureters. No hydronephrosis. No perinephric or periureteral stranding. Urinary bladder is unremarkable. GI AND BOWEL: Stomach demonstrates no acute abnormality. There is no bowel obstruction. The appendix is normal. PERITONEUM AND RETROPERITONEUM: A small amount of free fluid in the pelvis is likely physiologic. No free air. VASCULATURE: Aorta is normal in caliber. LYMPH NODES: No lymphadenopathy. REPRODUCTIVE ORGANS: Intrauterine device in the uterus. An involuting cyst in the left ovary, likely physiologic. BONES AND SOFT TISSUES: No acute osseous abnormality. No focal soft tissue abnormality. IMPRESSION: 1. No acute findings in the abdomen or pelvis. 2. Involuting cyst in the left ovary with free fluid in the pelvis, likely physiologic. Electronically  signed by: Elsie Gravely MD 02/08/2024 07:45 PM EST RP Workstation: [id number]   DG Chest Portable 1 View Result Date: 02/08/2024 EXAM: 1 VIEW(S) XRAY OF THE CHEST 02/08/2024 07:25:00 PM COMPARISON: Prior study dated 10/22/2014. CLINICAL HISTORY: abd pain FINDINGS: LUNGS AND PLEURA: No focal pulmonary opacity. No pleural effusion. No pneumothorax. HEART AND MEDIASTINUM: No acute abnormality of the cardiac and mediastinal silhouettes. BONES AND SOFT TISSUES: No acute osseous abnormality. IMPRESSION: 1. No acute cardiopulmonary abnormality detected. Electronically signed by: Elsie Gravely MD 02/08/2024 07:30 PM EST RP Workstation: [id number]     .Sedation  Date/Time: 02/08/2024 9:14 PM  Performed by: Randol Simmonds, MD Authorized by: Randol Simmonds, MD   Consent:    Consent obtained:  Verbal   Consent given by:  Parent   Risks discussed:  Allergic reaction, dysrhythmia, inadequate sedation, nausea, vomiting, respiratory compromise necessitating ventilatory assistance and intubation, prolonged sedation necessitating reversal and prolonged hypoxia resulting in organ damage Universal protocol:  Immediately prior to procedure, a time out was called: yes     Patient identity confirmed:  Hospital-assigned identification number Indications:    Procedure performed:  Imaging studies   Procedure necessitating sedation performed by:  Physician performing sedation Pre-sedation assessment:    Time since last food or drink:  5   ASA classification: class 2 - patient with mild systemic disease     Mouth opening:  3 or more finger widths   Mallampati score:  II - soft palate, uvula, fauces visible   Neck mobility: normal     Pre-sedation assessments completed and reviewed: pre-procedure airway patency not reviewed, pre-procedure cardiovascular function not reviewed, pre-procedure hydration status not reviewed, pre-procedure mental status not reviewed, pre-procedure nausea and vomiting status not reviewed,  pre-procedure pain level not reviewed and pre-procedure respiratory function not reviewed   A pre-sedation assessment was completed prior to the start of the procedure Immediate pre-procedure details:    Reassessment: Patient reassessed immediately prior to procedure     Reviewed: vital signs     Verified: bag valve mask available, emergency equipment available, intubation equipment available, IV patency confirmed, oxygen available, reversal medications available and suction available   Procedure details (see MAR for exact dosages):    Preoxygenation:  Room air   Sedation:  Ketamine    Intended level of sedation: deep   Intra-procedure events: none     Total Provider sedation time (minutes):  15 Post-procedure details:   A post-sedation assessment was completed following the completion of the procedure.   Attendance: Constant attendance by certified staff until patient recovered     Recovery: Patient returned to pre-procedure baseline     Post-sedation assessments completed and reviewed: nausea/vomiting     Post-sedation assessments completed and reviewed: post-procedure airway patency not reviewed, post-procedure cardiovascular function not reviewed, post-procedure hydration status not reviewed, post-procedure mental status not reviewed, pain score not reviewed, post-procedure respiratory function not reviewed and post-procedure temperature not reviewed     Patient is stable for discharge or admission: yes     Procedure completion:  Tolerated well, no immediate complications    Medications Ordered in the ED  ondansetron  (ZOFRAN ) injection 4 mg (4 mg Intravenous Patient Refused/Not Given 02/08/24 2034)  haloperidol  lactate (HALDOL ) injection 5 mg (5 mg Intramuscular Given 02/08/24 1748)  LORazepam  (ATIVAN ) injection 2 mg (2 mg Intramuscular Given 02/08/24 1749)  morphine  (PF) 4 MG/ML injection 4 mg (4 mg Intravenous Given 02/08/24 1950)  ondansetron  (ZOFRAN ) injection 4 mg (4 mg Intravenous  Given 02/08/24 1956)  ketamine  (KETALAR ) injection 165 mg (165 mg Intramuscular Given 02/08/24 1908)  iohexol  (OMNIPAQUE ) 300 MG/ML solution 100 mL (100 mLs Intravenous Contrast Given 02/08/24 1933)    Clinical Course as of 02/08/24 2114  Sun Feb 08, 2024  1926 Patient remained agitated after Haldol  and Ativan .  Discussed with parents and she was then given a dose of ketamine  for sedation.  Patient tolerated that well.  We were then able to do an In-N-Out catheter placed an IV.  Will proceed with CT scanning [JK]  2001 CBC with Diff CBC normal.  Metabolic panel normal. [JK]  2001 Chest x-ray without acute findings [JK]  2001 CT scan does not show any acute abnormality.  Patient does have a cyst in the left ovary [JK]  2114 Patient initially vomited after sedation.  She was given antiemetics.  Family states she seems to be back to baseline and ready for discharge [JK]    Clinical Course User Index [JK]  Randol Simmonds, MD                                 Medical Decision Making Problems Addressed: Cyst of left ovary: acute illness or injury that poses a threat to life or bodily functions  Amount and/or Complexity of Data Reviewed Labs: ordered. Decision-making details documented in ED Course. Radiology: ordered and independent interpretation performed.  Risk Prescription drug management. Parenteral controlled substances. Drug therapy requiring intensive monitoring for toxicity. Diagnosis or treatment significantly limited by social determinants of health.   Patient presented with increasing agitation.  Mom and dad were concerned that she had some type of acute process in her abdomen as she had been holding onto her abdomen.  Patient is nonverbal at baseline.  Patient was not able to cooperate with exam without sedative medications.  Initially tried Haldol  and Ativan  without success.  Patient required a dose of ketamine  for sedation.  Patient was monitored closely.  We were able to obtain  urine proceed with CT scans and laboratory testing.  Fortunately laboratory tests are otherwise unremarkable.  Urinalysis does not show infection.  The patient CT scan does not show signs of appendicitis or other acute abnormality.  There is an ovarian cyst on the left side.  I suspect this could be contributing to her symptoms.  Will discharge home.  OTC nsaids prn     Final diagnoses:  Cyst of left ovary    ED Discharge Orders     None          Randol Simmonds, MD 02/08/24 2117

## 2024-02-08 NOTE — ED Notes (Signed)
 Pt is extremely agitated unable to obtain vitals

## 2024-02-09 ENCOUNTER — Telehealth (HOSPITAL_BASED_OUTPATIENT_CLINIC_OR_DEPARTMENT_OTHER): Payer: Self-pay

## 2024-02-09 NOTE — Telephone Encounter (Signed)
 Will you please call the Mom and ask if she thinks Gabrielle [name] needs to be evaluated by me in the office this week? (And work her in if she does).  [name] MARLA Roller
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
## 2024-02-10 ENCOUNTER — Ambulatory Visit (INDEPENDENT_AMBULATORY_CARE_PROVIDER_SITE_OTHER): Payer: MEDICAID | Admitting: Certified Nurse Midwife

## 2024-02-10 ENCOUNTER — Other Ambulatory Visit (HOSPITAL_BASED_OUTPATIENT_CLINIC_OR_DEPARTMENT_OTHER): Payer: Self-pay

## 2024-02-10 VITALS — Wt 182.0 lb

## 2024-02-10 DIAGNOSIS — R300 Dysuria: Secondary | ICD-10-CM

## 2024-02-10 LAB — POCT URINALYSIS DIP (CLINITEK)
Bilirubin, UA: NEGATIVE
Glucose, UA: NEGATIVE mg/dL
Nitrite, UA: NEGATIVE
POC PROTEIN,UA: 30 — AB
Spec Grav, UA: >=1.03 — AB (ref 1.010–1.025)
Urobilinogen, UA: 0.2 U/dL
pH, UA: 6 (ref 5.0–8.0)

## 2024-02-10 MED ORDER — OXYCODONE-ACETAMINOPHEN 5-325 MG PO TABS
1.0000 | ORAL_TABLET | ORAL | 0 refills | Status: AC | PRN
  Filled 2024-02-10: qty 20, 4d supply, fill #0

## 2024-02-10 MED ORDER — CLONAZEPAM 0.5 MG PO TABS
0.5000 mg | ORAL_TABLET | Freq: Every day | ORAL | 0 refills | Status: AC
  Filled 2024-02-10: qty 1, 1d supply, fill #0

## 2024-02-10 MED ORDER — OXYCODONE-ACETAMINOPHEN 5-325 MG PO TABS: 1.0000 | ORAL_TABLET | ORAL | 0 refills | Status: AC | PRN

## 2024-02-10 MED ORDER — SULFAMETHOXAZOLE-TRIMETHOPRIM 800-160 MG PO TABS
1.0000 | ORAL_TABLET | Freq: Two times a day (BID) | ORAL | 0 refills | Status: AC
  Filled 2024-02-10: qty 14, 7d supply, fill #0

## 2024-02-10 MED ORDER — CLONAZEPAM 0.5 MG PO TABS: 0.5000 mg | ORAL_TABLET | Freq: Two times a day (BID) | ORAL | 0 refills | Status: DC | PRN

## 2024-02-10 MED ORDER — PHENAZOPYRIDINE HCL 200 MG PO TABS
200.0000 mg | ORAL_TABLET | Freq: Three times a day (TID) | ORAL | 0 refills | Status: AC | PRN
  Filled 2024-02-10: qty 10, 4d supply, fill #0

## 2024-02-10 NOTE — Telephone Encounter (Signed)
 Spoke with patient's mother, Gabrielle [name], okay per ROI. Gabrielle [name] does feel that an office visit may be beneficial. Gabrielle [name] is hesitant/concerned stating that when Gabrielle [name] is in pain lately she has been yelling and acting out. Patient is taking Ibuprogen 800 mg q 6 hrs and alternating Tylenol  which seems to be providing some relief. Gabrielle [name] is worried about Gabrielle [name] going into the holiday and what may happen with pain. Gabrielle [name] is worried Gabrielle [name] may have a hard time coming into the office and she does not want to worry other patients. Asking if this could be a virtual visit? States they will come in person, but wanted to check first just in case. Also, asking if [name] was able to review the patient's CT scan and what her thoughts may be. Advised will review with [name] and return call.

## 2024-02-10 NOTE — Telephone Encounter (Signed)
 Patient's mother called and left voicemail on the nurses' line. She is wanting to check in regarding her conversation with Kaitlyn this morning. She states that Rhapsody has been crying a lot again today and the Ibuprofen  does not seem to be providing her with any relief.   [name], RN

## 2024-02-11 ENCOUNTER — Ambulatory Visit (HOSPITAL_COMMUNITY): Payer: Self-pay

## 2024-02-11 NOTE — Progress Notes (Signed)
 Pt here for Nurse Visit for UA. Mom brought urine sample with her to office.  Pt accompanied by her Mom, Dad and Caregiver. Pt Mom reports that patient is non-verbal but has been crying and seems to be in pain. She had a recent incidence of urinary incontinence (unusual for her). Mom denies any fever or chills in patient. Recent UA result indicated presence of Nitrites. Mom was also told recently of possibility of kidney stones due to presence of calcium oxalate in UA. For now, we will treat for UTI with Bactrim  DS. Rx sent for Pyridium  as urinary analgesic. Pt unable to remain calm at this time. May take Klonopin  x 1 (only once) if needed to calm patient in order to start her antibiotics. Rx sent for percocet if needed for pain (with food/may try 1/2 tablet). Urine culture is pending.   [name] MARLA Roller

## 2024-02-12 LAB — URINE CULTURE

## 2024-02-17 ENCOUNTER — Telehealth (HOSPITAL_BASED_OUTPATIENT_CLINIC_OR_DEPARTMENT_OTHER): Payer: Self-pay

## 2024-02-17 NOTE — Telephone Encounter (Signed)
 Patient's mother called and left message. She states that patient has completed antibiotic for UTI and seems to be doing much better. She is wondering if patient should come in or if they could bring in a urine sample to be re-checked to ensure that UTI is completely gone.   Patient's mother would also like Dr. Cleotilde to review CT scan that patient had done in the ER on 02/08/2024 that showed evidence of an ovarian cyst. She would like recommendations on how to treat or what needs to be done for this moving forward.   [name], RN

## 2024-02-18 ENCOUNTER — Ambulatory Visit (HOSPITAL_BASED_OUTPATIENT_CLINIC_OR_DEPARTMENT_OTHER): Payer: Self-pay | Admitting: Certified Nurse Midwife

## 2024-02-26 NOTE — Telephone Encounter (Signed)
 Spoke with patient's mother, [name]. She is going to bring urine sample by for urine culture re-check to ensure that UTI has gone away. She states that [name] gave her a sterile cup when she was here a couple of weeks ago and she will bring it by tomorrow morning.   Advised that I would send message to Dr. Cleotilde requesting that she review the CT scan regarding patient's ovarian cyst and we would be in contact once we receive her opinion on how to move forward. Patient's mother verbalized understanding with no further questions or concerns at this time.  [name], RN

## 2024-02-27 ENCOUNTER — Other Ambulatory Visit (HOSPITAL_BASED_OUTPATIENT_CLINIC_OR_DEPARTMENT_OTHER): Payer: Self-pay

## 2024-02-27 DIAGNOSIS — Z87440 Personal history of urinary (tract) infections: Secondary | ICD-10-CM

## 2024-02-28 ENCOUNTER — Other Ambulatory Visit: Payer: Self-pay | Admitting: Obstetrics & Gynecology

## 2024-03-01 LAB — URINE CULTURE

## 2024-03-04 ENCOUNTER — Ambulatory Visit (HOSPITAL_BASED_OUTPATIENT_CLINIC_OR_DEPARTMENT_OTHER): Payer: Self-pay | Admitting: Obstetrics & Gynecology

## 2024-03-05 ENCOUNTER — Encounter (HOSPITAL_BASED_OUTPATIENT_CLINIC_OR_DEPARTMENT_OTHER): Payer: Self-pay | Admitting: Obstetrics & Gynecology

## 2024-03-21 ENCOUNTER — Encounter (HOSPITAL_BASED_OUTPATIENT_CLINIC_OR_DEPARTMENT_OTHER): Payer: Self-pay | Admitting: Obstetrics & Gynecology

## 2024-03-30 ENCOUNTER — Other Ambulatory Visit (HOSPITAL_COMMUNITY): Payer: Self-pay | Admitting: Psychiatry

## 2024-03-30 ENCOUNTER — Telehealth (HOSPITAL_COMMUNITY): Payer: Self-pay

## 2024-03-30 DIAGNOSIS — F89 Unspecified disorder of psychological development: Secondary | ICD-10-CM

## 2024-03-30 DIAGNOSIS — F840 Autistic disorder: Secondary | ICD-10-CM

## 2024-03-30 DIAGNOSIS — Z79899 Other long term (current) drug therapy: Secondary | ICD-10-CM

## 2024-03-30 MED ORDER — ARIPIPRAZOLE 5 MG PO TABS: 5.0000 mg | ORAL_TABLET | Freq: Three times a day (TID) | ORAL | 0 refills | Status: DC

## 2024-03-30 NOTE — Progress Notes (Signed)
 BH MD Outpatient Progress Note  04/01/2024 1:36 PM [name]  MRN:  [id number]  Assessment:  Gabrielle [name] presents for follow-up evaluation. Today, 04/01/24, patient appears to be less restless compared to prior visits and while she had an episode of increased agitation in November, it appears that for at least the past month overall her behaviors have improved per mom's report. She did have some increased agitation yesterday and mom helps with ensuring medical causes are ruled out for her episodes of increased agitation. Given that overall patient's behavior have improved, we discussed continuing with current medication regimen though we discussed changing the formulation of her abilify  to tablet form given issues with determining when refills for abilify  liquid are needed and, given abilify 's long half-life, we discussed that we could switch to once daily dosing. Mom was amenable to this. This provider also discussed with pharmacist who stated that the abilify  tablets could be crushed. 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 We discussed retrial of NAC however mom declined at this time given previous ineffectiveness. Mom has also decreased patient's clonidine  given that patient has had increased sleep and has been giving her clonidine  only twice a day. Given that she has not noticed any change in behavior with this and patient on higher dose of clonidine , we discussed can continue with the twice daily dosing. We will continue to monitor with current medication regimen.   Identifying Information: Gabrielle [name] is a 24 y.o. female with a history of non-verbal autism, skin excoriation disorder, trichotillomania, tic disorder, developmental delay and akathisia who is an established patient with [facility] for management of medications.  Risk Assessment: An assessment  of suicide and violence risk factors was performed as part of this evaluation and is not significantly changed from the last visit.             While future psychiatric events cannot be accurately predicted, the patient does not currently require acute inpatient psychiatric care and does not currently meet [place]  involuntary commitment criteria.          Plan:  # Autism Spectrum Disorder # Developmental delay  Past medication trials: Celexa  (1 week trial, mom reports patient appeared to have trouble sleeping and a flatter affect), Lexapro, Luvox (failed), Zoloft, Focalin (became more agitated and dangerous to self) ropinirole, BuSpar, gabapentin, Trileptal, Lamictal, clonidine , Vistaril, NAC, Valium (prior to procedures), Klonopin , Versed  (prior to procedures), Ativan , triazolam (prior to procedures) Status of problem: ongoing Interventions: -- Change to abilify  15mg  tablet for agitation, irritability and impulsivity (can be crushed per pharmacist)  Due for A1c and lipid panel, parent reports try to coordinate when sedated -- Continue trazodone  75mg  at bedtime   # Excoriation disorder (skin picking) # Trichotillomania  # Stereotypic Movement Disorder Past medication trials: NAC Status of problem: ongoing Interventions: -- Continue prozac  10mg  (2.48mL) for anxiety, skin picking and hair pulling -- Continue decreased clonidine  0.2mg  AM, 0.2mg  afternoon   Return to care in  Future Appointments  Date Time Provider Department Center  05/13/2024  1:30 PM Graham Krabbe, MD [facility] None   Patient was given contact information for behavioral health clinic and was instructed to call 911 for emergencies.   Patient and plan of care will be discussed with the Attending MD who agrees with the above statement and plan.   Subjective:  Chief Complaint: medication management.  Interval History:  --01/2024 was seen in ED for pain, imaging showed L ovarian cyst  --01/2024 treated UTI  with bactrim .  --02/2024 urine culture was negative. No follow-up recommended for ovarian cyst  --01/2024 labs notable for UA with +nitrite, stable CBC, CMP, lipase wnl. CT abdomen with L ovarian cyst.   Patient is seen with mom. Patient is non-verbal, she is seen again intermittently pacing the room with stereotyped movements though she has a period of sitting down. Mom reports multiple changes in patient's behavior since our last visit. She reports in October patient started to have symptoms of pain and was either holding in her urine or having increased urination. At the end of November and beginning of December, patient had a 5 day episode of increased agitation and aggression and appeared to have increased pain during that time. She was given medications for urinary tract infection and mom correlates improvement with her behavior with when she received UTI medication. She reports that patient had improved behaviors in December though she started to cry and hold urine again yesterday. She reports that patient has another appointment to ensure no UTI. Mom reports with her UTI treated it appears that patient does not have gynecologic pain however she has noticed an increase in her hair picking. Mom reports no issues with patient's sleep or appetite. She reports adherence with medications and denies side effects. She notes that she decreased patient's clonidine  dose by 0.1mg  due to patient waking up later and did not notice a change in her behavior. She also reports desire to switch back to abilify  tablets given issues with ability to determine when refills of the abilify  liquid are needed and this provider had previously discussed with pharmacist who stated that abilify  tablets could be crushed. We decided to change her abilify  back to tablets and for dosing to be once daily given abilify 's long half life. No substance use or SI/HI/AVH reported by mom.   Visit Diagnosis:    ICD-10-CM   1. Developmental  disability  F89 cloNIDine  (CATAPRES ) 0.2 MG tablet    ARIPiprazole  (ABILIFY ) 15 MG tablet    2. Autism  F84.0 cloNIDine  (CATAPRES ) 0.2 MG tablet    ARIPiprazole  (ABILIFY ) 15 MG tablet    FLUoxetine  (PROZAC ) 20 MG/5ML solution    traZODone  (DESYREL ) 50 MG tablet    3. Long term current use of antipsychotic medication  Z79.899 ARIPiprazole  (ABILIFY ) 15 MG tablet    4. 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 Trichotillomania  F63.3       Past Psychiatric History:  Diagnoses: stereotypic movement disorder, developmental disability, autism Medication trials:   abilify  5mg  BID, clonidine  0.1mg  PM, clonidine  0.2 BID, prozac  10mg  daily  Past: Celexa  (1 week trial, mom reports patient appeared to have trouble sleeping and a flatter affect), Lexapro, Luvox (failed), Zoloft, Focalin (became more agitated and dangerous to self) ropinirole, BuSpar, gabapentin, Trileptal, Lamictal, clonidine , Vistaril, NAC, Valium (prior to procedures), Klonopin , Versed  (prior to procedures), Ativan , triazolam (prior to procedures)  Previous psychiatrist/therapist: Dr. Rainelle, Dr. Juleen Substance use: UDS, PDMP ativan  2mg  1# 08/20/2023  Past Medical History:  Past Medical History:  Diagnosis Date   Anemia    Anxiety    Autism disorder    per mother severe autism, very social,  good receptive languange but nonverbal (can do some sign languange) , high tolerence to pain   Dysmenorrhea    Menorrhagia    Nonverbal    PT AUTISTIC   PONV (postoperative  nausea and vomiting)    per mother when wakes up pt can go from 0 to 60 and starts taking off cords and try to sit up to stand, better when mother and father are they when she wakes up   Repetitive movement    due to autism---  chronic picking of own skin and body    Past Surgical History:  Procedure Laterality Date   DENTAL RESTORATION/EXTRACTION WITH X-RAY     DILATION AND CURETTAGE OF UTERUS N/A 04/07/2017   Procedure: DILATATION AND CURETTAGE;  Surgeon:  Cleotilde Ronal RAMAN, MD;  Location: [facility];  Service: Gynecology;  Laterality: N/A;   INTRAUTERINE DEVICE (IUD) INSERTION N/A 04/07/2017   Procedure: PLACEMENT OF KYLEENA   IUD;  Surgeon: Cleotilde Ronal RAMAN, MD;  Location: [facility];  Service: Gynecology;  Laterality: N/A;   INTRAUTERINE DEVICE (IUD) INSERTION N/A 04/23/2022   Procedure: INTRAUTERINE DEVICE (IUD) INSERTION;  Surgeon: Cleotilde Ronal RAMAN, MD;  Location: [facility];  Service: Gynecology;  Laterality: N/A;   IUD REMOVAL N/A 04/23/2022   Procedure: INTRAUTERINE DEVICE (IUD) REMOVAL;  Surgeon: Cleotilde Ronal RAMAN, MD;  Location: [facility];  Service: Gynecology;  Laterality: N/A;   MASS EXCISION Left 06/15/2015   Procedure: EXCISION OF LEFT AXILLARY MASS/TETANUS INJECTION;  Surgeon: Gabrielle RAMAN Fritter, DO;  Location: [facility];  Service: Plastics;  Laterality: Left;   OPERATIVE ULTRASOUND N/A 04/23/2022   Procedure: OPERATIVE ULTRASOUND;  Surgeon: Cleotilde Ronal RAMAN, MD;  Location: [facility];  Service: Gynecology;  Laterality: N/A;   Contraception: IUD  Family Psychiatric History: Parents report some history of family members with depression    Family History:  Family History  Problem Relation Age of Onset   Heart disease Maternal Grandmother    Endometriosis Maternal Grandmother    Diabetes Maternal Grandfather    Cancer Maternal Grandfather        Skin Cancer   Asthma Mother    Hyperlipidemia Mother    Hypertension Paternal Grandfather    Hypertension Father    Hyperlipidemia Father    Diverticulosis Father    Arthritis Paternal Grandmother     Social History:  Academic/Vocational: Lives with both parents. Attends half day at the day program.   Substance Use History:   Social History   Socioeconomic History   Marital status: Single    Spouse name: Not on file   Number of children: Not on file   Years of education: Not on file   Highest  education level: Not on file  Occupational History   Occupation: Medical Illustrator in [place]  Tobacco Use   Smoking status: Never   Smokeless tobacco: Never  Vaping Use   Vaping status: Never Used  Substance and Sexual Activity   Alcohol use: Never   Drug use: Never   Sexual activity: Never    Birth control/protection: None  Other Topics Concern   Not on file  Social History Narrative   Patient lives with both parents. Gabrielle graduated from [employer]. She is now at [employer]       Behavior specialist consult with mom once a month.    Therapies: Not at the moment.    Social Drivers of Health   Tobacco Use: Low Risk (02/08/2024)   Patient History    Smoking Tobacco Use: Never    Smokeless Tobacco Use: Never    Passive Exposure: Not on file  Financial Resource Strain: Not on  file  Food Insecurity: Not on file  Transportation Needs: Not on file  Physical Activity: Not on file  Stress: Not on file  Social Connections: Not on file  Depression (EYV7-0): Not on file  Alcohol Screen: Not on file  Housing: Not on file  Utilities: Not on file  Health Literacy: Not on file    Allergies:  Allergies  Allergen Reactions   Cleaner [Ao-Sept Disinfection-Neutral] Hives    BRAND NAME - KABOOM    Current Medications: Current Outpatient Medications  Medication Sig Dispense Refill   ARIPiprazole  (ABILIFY ) 15 MG tablet Take 1 tablet (15 mg total) by mouth daily. 90 tablet 0   clonazePAM  (KLONOPIN ) 0.5 MG tablet Take 1 tablet (0.5 mg total) by mouth daily. 1 tablet 0   cloNIDine  (CATAPRES ) 0.2 MG tablet Take 1 tablet (0.2 mg total) by mouth 2 (two) times daily. To be taken each morning and at 1400 180 tablet 0   FLUoxetine  (PROZAC ) 20 MG/5ML solution Take 2.5 mLs (10 mg total) by mouth daily. 225 mL 0   ibuprofen  (ADVIL ) 200 MG tablet Take 400 mg by mouth every 6 (six) hours as needed for fever, headache or mild pain.     KYLEENA  19.5 MG IUD 1 Device by Intrauterine route  once for 1 dose. 1 Intra Uterine Device 0   Multiple Vitamin (MULTIVITAMIN) tablet Take 1 tablet by mouth daily. (Patient not taking: Reported on 02/10/2024)     mupirocin  ointment (BACTROBAN ) 2 % Apply 1 application topically 2 (two) times daily. (Patient taking differently: Apply 1 application  topically 2 (two) times daily as needed.) 22 g 0   norethindrone -ethinyl estradiol-FE (LOESTRIN FE) 1-20 MG-MCG tablet Take 1 tablet by mouth daily. Skip placebo pills and take continuous active. (Patient not taking: Reported on 06/13/2023) 112 tablet 1   oxyCODONE -acetaminophen  (PERCOCET) 5-325 MG tablet Take 1 tablet by mouth every 4 (four) hours as needed for up to 5 doses. use only as much as needed to relieve pain 5 tablet 0   oxyCODONE -acetaminophen  (PERCOCET/ROXICET) 5-325 MG tablet Take 1 tablet by mouth every 4 (four) hours as needed for severe pain (pain score 7-10). 20 tablet 0   phenazopyridine  (PYRIDIUM ) 200 MG tablet Take 1 tablet (200 mg total) by mouth 3 (three) times daily as needed for pain. 10 tablet 0   traZODone  (DESYREL ) 50 MG tablet Take 1.5 tablets (75 mg total) by mouth at bedtime. 135 tablet 0   No current facility-administered medications for this visit.    ROS: Review of Systems - unable to assess   Objective:  Psychiatric Specialty Exam: There were no vitals taken for this visit.There is no height or weight on file to calculate BMI.  General Appearance: Casual  Eye Contact:  Minimal  Speech:  Garbled  Volume:  Increased  Mood:  unable to assess  Affect:  Appropriate  Thought Content: unable to assess   Suicidal Thoughts:  unable to assess  Homicidal Thoughts:  unable to assess  Thought Process:  unable to assess  Orientation:  Other:  unable to assess    Memory: unable to assess  Judgment:  Impaired  Insight:  Lacking  Concentration:  Concentration: Poor  Recall: not formally assessed   Fund of Knowledge: Poor  Language: Poor  Psychomotor Activity:  Increased  and Restlessness, however no opening the door or needing snacks   Akathisia:  pacing around the room  AIMS (if indicated): done. No stiffness or cogwheeling in extremities - 09/2023  Assets:  Housing Social Support Vocational/Educational  ADL's:  Intact  Cognition: WNL  Sleep:  Fair   PE: General: no acute distress  Pulm: no increased work of breathing on room air  Strength & Muscle Tone: within normal limits Neuro: no focal neurological deficits observed  Gait & Station: normal  Metabolic Disorder Labs: Lab Results  Component Value Date   HGBA1C 5.3 04/23/2022   MPG 105.41 04/23/2022   MPG 108.28 08/11/2019   Lab Results  Component Value Date   PROLACTIN 7.9 04/23/2022   Lab Results  Component Value Date   CHOL 181 04/23/2022   TRIG 68 04/23/2022   HDL 31 (L) 04/23/2022   CHOLHDL 5.8 04/23/2022   VLDL 14 04/23/2022   LDLCALC 136 (H) 04/23/2022   LDLCALC 102 (H) 08/11/2019   Lab Results  Component Value Date   TSH 2.564 04/23/2022   TSH 2.188 08/11/2019    Therapeutic Level Labs: No results found for: LITHIUM No results found for: VALPROATE No results found for: CBMZ  Screenings:  Flowsheet Row UC from 10/03/2020 in [facility])  C-SSRS RISK CATEGORY No Risk    Collaboration of Care: Collaboration of Care: Medication Management AEB attending MD  Patient/Guardian was advised Release of Information must be obtained prior to any record release in order to collaborate their care with an outside provider. Patient/Guardian was advised if they have not already done so to contact the registration department to sign all necessary forms in order for us  to release information regarding their care.   Consent: Patient/Guardian gives verbal consent for treatment and assignment of benefits for services provided during this visit. Patient/Guardian expressed understanding and agreed to proceed.   Corean Minor, MD,  PGY-3 04/01/2024, 1:36 PM

## 2024-03-30 NOTE — Telephone Encounter (Signed)
 Patient mother has called in requesting to speak with provider. Per mother patient has been prescribed Aripiprazole  1 MG/ML mL, however; it was prescribed for only 20 days. She is currently out of medications.

## 2024-03-30 NOTE — Progress Notes (Signed)
 Called mom, patient had been running out of abilify  earlier due to having not enough dose (300 mL for 20 days instead of 450mL for 30 days). Mom expressed preference for tablets due to dark bottle making it difficult for her to determine when to refill as well as difficulties with measuring, we switched to abilify  tablets 5mg  three times a day to bridge until next appointment on Thursday and mom will pick this up at pharmacy and we will further discuss plan then.

## 2024-04-01 ENCOUNTER — Ambulatory Visit (INDEPENDENT_AMBULATORY_CARE_PROVIDER_SITE_OTHER): Payer: MEDICAID | Admitting: Psychiatry

## 2024-04-01 DIAGNOSIS — F633 Trichotillomania: Secondary | ICD-10-CM | POA: Diagnosis not present

## 2024-04-01 DIAGNOSIS — F424 Excoriation (skin-picking) disorder: Secondary | ICD-10-CM | POA: Diagnosis not present

## 2024-04-01 DIAGNOSIS — F89 Unspecified disorder of psychological development: Secondary | ICD-10-CM

## 2024-04-01 DIAGNOSIS — F840 Autistic disorder: Secondary | ICD-10-CM

## 2024-04-01 DIAGNOSIS — Z79899 Other long term (current) drug therapy: Secondary | ICD-10-CM | POA: Diagnosis not present

## 2024-04-01 MED ORDER — TRAZODONE HCL 50 MG PO TABS: 75.0000 mg | ORAL_TABLET | Freq: Every day | ORAL | 0 refills | Status: AC

## 2024-04-01 MED ORDER — ARIPIPRAZOLE 15 MG PO TABS: 15.0000 mg | ORAL_TABLET | Freq: Every day | ORAL | 0 refills | Status: AC

## 2024-04-01 MED ORDER — FLUOXETINE HCL 20 MG/5ML PO SOLN: 10.0000 mg | Freq: Every day | ORAL | 0 refills | Status: AC

## 2024-04-01 MED ORDER — CLONIDINE HCL 0.2 MG PO TABS: 0.2000 mg | ORAL_TABLET | Freq: Two times a day (BID) | ORAL | 0 refills | Status: AC

## 2024-05-13 ENCOUNTER — Encounter (HOSPITAL_COMMUNITY): Payer: MEDICAID | Admitting: Psychiatry
